# Patient Record
Sex: Female | Born: 1975 | Race: Black or African American | Hispanic: No | Marital: Single | Smoking: Never smoker
Health system: Southern US, Community
[De-identification: ages and names within clinical notes are randomized; demographics above are authoritative.]

## PROBLEM LIST (undated history)

## (undated) DIAGNOSIS — R0683 Snoring: Secondary | ICD-10-CM

## (undated) DIAGNOSIS — R55 Syncope and collapse: Secondary | ICD-10-CM

## (undated) DIAGNOSIS — I429 Cardiomyopathy, unspecified: Secondary | ICD-10-CM

## (undated) DIAGNOSIS — I471 Supraventricular tachycardia, unspecified: Secondary | ICD-10-CM

## (undated) DIAGNOSIS — R42 Dizziness and giddiness: Secondary | ICD-10-CM

## (undated) DIAGNOSIS — G43909 Migraine, unspecified, not intractable, without status migrainosus: Secondary | ICD-10-CM

## (undated) DIAGNOSIS — Z5189 Encounter for other specified aftercare: Secondary | ICD-10-CM

## (undated) DIAGNOSIS — Z9289 Personal history of other medical treatment: Secondary | ICD-10-CM

## (undated) HISTORY — DX: Cardiomyopathy, unspecified: I42.9

## (undated) HISTORY — PX: EPIGASTRIC HERNIA REPAIR: SHX404

## (undated) HISTORY — DX: Supraventricular tachycardia: I47.1

## (undated) HISTORY — DX: Personal history of other medical treatment: Z92.89

## (undated) HISTORY — PX: VAGINAL HYSTERECTOMY: SUR661

## (undated) HISTORY — DX: Dizziness and giddiness: R42

## (undated) HISTORY — PX: TONSILLECTOMY: SUR1361

## (undated) HISTORY — DX: Snoring: R06.83

## (undated) HISTORY — PX: TUBAL LIGATION: SHX77

## (undated) HISTORY — DX: Syncope and collapse: R55

## (undated) HISTORY — DX: Migraine, unspecified, not intractable, without status migrainosus: G43.909

## (undated) HISTORY — DX: Supraventricular tachycardia, unspecified: I47.10

## (undated) HISTORY — PX: CYSTECTOMY: SUR359

## (undated) HISTORY — DX: Encounter for other specified aftercare: Z51.89

---

## 2001-10-31 ENCOUNTER — Emergency Department (HOSPITAL_COMMUNITY): Admission: EM | Admit: 2001-10-31 | Discharge: 2001-10-31 | Payer: Self-pay | Admitting: Emergency Medicine

## 2002-01-22 ENCOUNTER — Encounter: Payer: Self-pay | Admitting: Emergency Medicine

## 2002-01-22 ENCOUNTER — Emergency Department (HOSPITAL_COMMUNITY): Admission: EM | Admit: 2002-01-22 | Discharge: 2002-01-22 | Payer: Self-pay | Admitting: Emergency Medicine

## 2002-10-09 ENCOUNTER — Emergency Department (HOSPITAL_COMMUNITY): Admission: EM | Admit: 2002-10-09 | Discharge: 2002-10-09 | Payer: Self-pay | Admitting: Emergency Medicine

## 2002-10-18 ENCOUNTER — Ambulatory Visit (HOSPITAL_COMMUNITY): Admission: RE | Admit: 2002-10-18 | Discharge: 2002-10-18 | Payer: Self-pay

## 2002-10-18 ENCOUNTER — Encounter: Payer: Self-pay | Admitting: Family Medicine

## 2003-08-14 ENCOUNTER — Ambulatory Visit (HOSPITAL_COMMUNITY): Admission: RE | Admit: 2003-08-14 | Discharge: 2003-08-14 | Payer: Self-pay | Admitting: Internal Medicine

## 2003-08-24 ENCOUNTER — Other Ambulatory Visit: Admission: RE | Admit: 2003-08-24 | Discharge: 2003-08-24 | Payer: Self-pay | Admitting: Otolaryngology

## 2003-09-15 ENCOUNTER — Emergency Department (HOSPITAL_COMMUNITY): Admission: EM | Admit: 2003-09-15 | Discharge: 2003-09-15 | Payer: Self-pay | Admitting: Emergency Medicine

## 2004-01-15 ENCOUNTER — Ambulatory Visit: Payer: Self-pay | Admitting: Family Medicine

## 2004-01-19 ENCOUNTER — Ambulatory Visit: Payer: Self-pay | Admitting: Family Medicine

## 2004-01-22 ENCOUNTER — Ambulatory Visit: Payer: Self-pay | Admitting: *Deleted

## 2004-01-23 ENCOUNTER — Ambulatory Visit: Payer: Self-pay | Admitting: Family Medicine

## 2004-01-30 ENCOUNTER — Ambulatory Visit: Payer: Self-pay | Admitting: *Deleted

## 2004-08-29 ENCOUNTER — Emergency Department (HOSPITAL_COMMUNITY): Admission: EM | Admit: 2004-08-29 | Discharge: 2004-08-29 | Payer: Self-pay | Admitting: Emergency Medicine

## 2004-09-20 ENCOUNTER — Ambulatory Visit: Payer: Self-pay | Admitting: Family Medicine

## 2004-11-11 ENCOUNTER — Emergency Department (HOSPITAL_COMMUNITY): Admission: EM | Admit: 2004-11-11 | Discharge: 2004-11-11 | Payer: Self-pay | Admitting: Emergency Medicine

## 2005-01-23 ENCOUNTER — Ambulatory Visit: Payer: Self-pay | Admitting: Family Medicine

## 2005-01-28 ENCOUNTER — Ambulatory Visit (HOSPITAL_COMMUNITY): Admission: RE | Admit: 2005-01-28 | Discharge: 2005-01-28 | Payer: Self-pay | Admitting: Internal Medicine

## 2005-02-12 ENCOUNTER — Ambulatory Visit: Payer: Self-pay | Admitting: Family Medicine

## 2005-03-12 ENCOUNTER — Ambulatory Visit: Payer: Self-pay | Admitting: Family Medicine

## 2005-06-16 ENCOUNTER — Ambulatory Visit: Payer: Self-pay | Admitting: Family Medicine

## 2005-09-11 ENCOUNTER — Ambulatory Visit: Payer: Self-pay | Admitting: Family Medicine

## 2005-10-21 ENCOUNTER — Ambulatory Visit: Payer: Self-pay | Admitting: Family Medicine

## 2005-12-22 ENCOUNTER — Emergency Department (HOSPITAL_COMMUNITY): Admission: EM | Admit: 2005-12-22 | Discharge: 2005-12-22 | Payer: Self-pay | Admitting: Emergency Medicine

## 2005-12-25 ENCOUNTER — Emergency Department (HOSPITAL_COMMUNITY): Admission: EM | Admit: 2005-12-25 | Discharge: 2005-12-25 | Payer: Self-pay | Admitting: Emergency Medicine

## 2007-05-21 ENCOUNTER — Emergency Department (HOSPITAL_COMMUNITY): Admission: EM | Admit: 2007-05-21 | Discharge: 2007-05-21 | Payer: Self-pay | Admitting: Emergency Medicine

## 2007-09-14 ENCOUNTER — Ambulatory Visit: Payer: Self-pay | Admitting: Family Medicine

## 2007-09-14 ENCOUNTER — Observation Stay (HOSPITAL_COMMUNITY): Admission: EM | Admit: 2007-09-14 | Discharge: 2007-09-15 | Payer: Self-pay | Admitting: Emergency Medicine

## 2007-09-30 ENCOUNTER — Ambulatory Visit: Payer: Self-pay | Admitting: Family Medicine

## 2007-10-05 ENCOUNTER — Ambulatory Visit: Payer: Self-pay | Admitting: Internal Medicine

## 2007-10-05 LAB — CONVERTED CEMR LAB
Eosinophils Relative: 0 % (ref 0–5)
Lymphs Abs: 1.9 10*3/uL (ref 0.7–4.0)
MCHC: 31.4 g/dL (ref 30.0–36.0)
Neutro Abs: 4.5 10*3/uL (ref 1.7–7.7)
Neutrophils Relative %: 63 % (ref 43–77)
Platelets: 470 10*3/uL — ABNORMAL HIGH (ref 150–400)
RBC: 4.74 M/uL (ref 3.87–5.11)
RDW: 25.6 % — ABNORMAL HIGH (ref 11.5–15.5)
Retic Ct Pct: 0.3 % — ABNORMAL LOW (ref 0.4–3.1)
WBC: 7 10*3/uL (ref 4.0–10.5)

## 2007-12-20 ENCOUNTER — Ambulatory Visit: Payer: Self-pay | Admitting: Family Medicine

## 2008-03-16 ENCOUNTER — Ambulatory Visit (HOSPITAL_COMMUNITY): Admission: RE | Admit: 2008-03-16 | Discharge: 2008-03-16 | Payer: Self-pay | Admitting: Obstetrics & Gynecology

## 2008-03-16 ENCOUNTER — Ambulatory Visit: Payer: Self-pay | Admitting: Obstetrics & Gynecology

## 2008-03-29 ENCOUNTER — Encounter: Payer: Self-pay | Admitting: Obstetrics & Gynecology

## 2008-03-29 ENCOUNTER — Ambulatory Visit: Payer: Self-pay | Admitting: Obstetrics & Gynecology

## 2008-06-20 ENCOUNTER — Encounter: Payer: Self-pay | Admitting: Obstetrics & Gynecology

## 2008-06-20 ENCOUNTER — Inpatient Hospital Stay (HOSPITAL_COMMUNITY): Admission: RE | Admit: 2008-06-20 | Discharge: 2008-06-23 | Payer: Self-pay | Admitting: Obstetrics & Gynecology

## 2008-06-20 ENCOUNTER — Ambulatory Visit: Payer: Self-pay | Admitting: Obstetrics & Gynecology

## 2008-08-10 ENCOUNTER — Ambulatory Visit: Payer: Self-pay | Admitting: Obstetrics and Gynecology

## 2008-08-10 LAB — CONVERTED CEMR LAB: Trich, Wet Prep: NONE SEEN

## 2008-12-07 ENCOUNTER — Emergency Department (HOSPITAL_COMMUNITY): Admission: EM | Admit: 2008-12-07 | Discharge: 2008-12-07 | Payer: Self-pay | Admitting: Emergency Medicine

## 2008-12-22 ENCOUNTER — Ambulatory Visit: Payer: Self-pay | Admitting: Family Medicine

## 2008-12-22 ENCOUNTER — Encounter (INDEPENDENT_AMBULATORY_CARE_PROVIDER_SITE_OTHER): Payer: Self-pay | Admitting: Adult Health

## 2008-12-22 ENCOUNTER — Telehealth (INDEPENDENT_AMBULATORY_CARE_PROVIDER_SITE_OTHER): Payer: Self-pay | Admitting: *Deleted

## 2008-12-22 LAB — CONVERTED CEMR LAB
ALT: 13 units/L (ref 0–35)
Albumin: 4.1 g/dL (ref 3.5–5.2)
BUN: 10 mg/dL (ref 6–23)
Basophils Absolute: 0 10*3/uL (ref 0.0–0.1)
Basophils Relative: 0 % (ref 0–1)
CO2: 24 meq/L (ref 19–32)
Calcium: 8.9 mg/dL (ref 8.4–10.5)
Chloride: 106 meq/L (ref 96–112)
Eosinophils Relative: 1 % (ref 0–5)
HDL: 57 mg/dL (ref >39)
Hemoglobin: 12.4 g/dL (ref 12.0–15.0)
LDL Cholesterol: 71 mg/dL (ref 0–99)
Lymphocytes Relative: 31 % (ref 12–46)
Monocytes Absolute: 0.4 10*3/uL (ref 0.1–1.0)
Neutrophils Relative %: 61 % (ref 43–77)
Platelets: 329 10*3/uL (ref 150–400)
Potassium: 4.1 meq/L (ref 3.5–5.3)
RBC: 4.57 M/uL (ref 3.87–5.11)
T3 Uptake Ratio: 37.2 % — ABNORMAL HIGH (ref 22.5–37.0)
Total Bilirubin: 0.3 mg/dL (ref 0.3–1.2)
Vit D, 25-Hydroxy: 24 ng/mL — ABNORMAL LOW (ref 30–89)
WBC: 5.3 10*3/uL (ref 4.0–10.5)

## 2009-05-22 ENCOUNTER — Ambulatory Visit: Payer: Self-pay | Admitting: Internal Medicine

## 2009-05-22 ENCOUNTER — Encounter (INDEPENDENT_AMBULATORY_CARE_PROVIDER_SITE_OTHER): Payer: Self-pay | Admitting: Adult Health

## 2009-05-22 LAB — CONVERTED CEMR LAB
BUN: 6 mg/dL (ref 6–23)
Basophils Absolute: 0 10*3/uL (ref 0.0–0.1)
CO2: 24 meq/L (ref 19–32)
Chloride: 106 meq/L (ref 96–112)
Creatinine, Ser: 0.58 mg/dL (ref 0.40–1.20)
Glucose, Bld: 85 mg/dL (ref 70–99)
Lipase: 10 units/L (ref 0–75)
Lymphocytes Relative: 14 % (ref 12–46)
Lymphs Abs: 1.2 10*3/uL (ref 0.7–4.0)
Monocytes Relative: 5 % (ref 3–12)
Neutro Abs: 6.6 10*3/uL (ref 1.7–7.7)
Platelets: 380 10*3/uL (ref 150–400)
Potassium: 4.1 meq/L (ref 3.5–5.3)
Total Bilirubin: 0.3 mg/dL (ref 0.3–1.2)
Total Protein: 7.6 g/dL (ref 6.0–8.3)

## 2009-05-24 ENCOUNTER — Encounter (INDEPENDENT_AMBULATORY_CARE_PROVIDER_SITE_OTHER): Payer: Self-pay | Admitting: *Deleted

## 2009-05-24 ENCOUNTER — Ambulatory Visit (HOSPITAL_COMMUNITY): Admission: RE | Admit: 2009-05-24 | Discharge: 2009-05-24 | Payer: Self-pay | Admitting: Internal Medicine

## 2009-05-28 ENCOUNTER — Ambulatory Visit: Payer: Self-pay | Admitting: Family Medicine

## 2009-06-25 ENCOUNTER — Ambulatory Visit: Payer: Self-pay | Admitting: Internal Medicine

## 2010-06-04 NOTE — Letter (Signed)
Summary: *HSN Results Follow up  [facility]  [address] [place], [place] [address]   Phone: [phone]  Fax: [phone]      05/24/2009   Tasha Ross [address] [employer] [place], [place]  [address]   Dear  [name],                            ____S.Drinkard,FNP   _X___A.Chamkasem,FNP       ____B. McPherson,MD   ____V. Rankins,MD    ____E. Mulberry,MD    ____N. Daphine Deutscher, FNP  ____D. Reche Dixon, MD    ____K. Philipp Deputy, MD    ____Other     This letter is to inform you that your recent test(s):  _______Pap Smear    ___X____Lab Test     _______X-ray    ___X____ is within acceptable limits  _______ requires a medication change  _______ requires a follow-up lab visit  _______ requires a follow-up visit with your provider   Comments: Your lab work is normal.       _________________________________________________________ If you have any questions, please contact our office                     Sincerely,  Gaylyn Cheers RN [facility]

## 2010-08-20 LAB — CBC
HCT: 24.4 % — ABNORMAL LOW (ref 36.0–46.0)
MCHC: 32.7 g/dL (ref 30.0–36.0)
MCHC: 32.7 g/dL (ref 30.0–36.0)
MCV: 76.7 fL — ABNORMAL LOW (ref 78.0–100.0)
MCV: 76.8 fL — ABNORMAL LOW (ref 78.0–100.0)
Platelets: 359 10*3/uL (ref 150–400)
RBC: 3.18 MIL/uL — ABNORMAL LOW (ref 3.87–5.11)
RBC: 3.31 MIL/uL — ABNORMAL LOW (ref 3.87–5.11)
RDW: 16.1 % — ABNORMAL HIGH (ref 11.5–15.5)
WBC: 10.1 10*3/uL (ref 4.0–10.5)
WBC: 4.4 10*3/uL (ref 4.0–10.5)
WBC: 6.5 10*3/uL (ref 4.0–10.5)

## 2010-08-20 LAB — CROSSMATCH: ABO/RH(D): O POS

## 2010-08-20 LAB — ABO/RH: ABO/RH(D): O POS

## 2010-09-17 NOTE — Group Therapy Note (Signed)
NAME:  Tasha Ross, Tasha Ross NO.:  [id number]   MEDICAL RECORD NO.:  [id number]          PATIENT TYPE:  WOC   LOCATION:  [facility]                   FACILITY:  [facility]   PHYSICIAN:  Dorthula Perfect, MD     DATE OF BIRTH:  Oct 20, 1975   DATE OF SERVICE:                                  CLINIC NOTE   A 35 year old female returns for followup with a past problem of  menorrhagia, fibroids, and secondary anemia.  The clinic note, May 2009,  shows that she had required 3 units of packed cells because of her  hemoglobin of 6.1.  Ultrasound has revealed submucous fibroid measuring  6.1 cm x 4.4 x 6 cm.  There is a notation that shows that she had had a  previous ultrasound in the past in 2006, at which time the fibroid was  3.1 cm.  Dr. Sharol Harness checked the patient and consulted with Dr. Marice Potter.  She was treated with Depo-Lupron 11.75.  She returns today for repeat  Depo-Lupron.  On questioning the patient is not sure whether she thought  that she would end up having a hysterectomy or not and she thought she  might continue to get the Depo-Lupron on into the future.   She is gravida 4, para 4.  Last menstrual period was a week ago.  That  was heavy with clots and lot of pain.  Prior to that, since the Depo-  Lupron, she was having normal periods that will last 5-7 days.  This  last period was much more painful.   Refer to the clinic note of Sep 30, 2007.   PHYSICAL EXAMINATION:  VITAL SIGNS:  Blood pressure 111/74, weight 150  pounds, height 5 feet 2 inches.  ABDOMEN:  Slightly obese, soft, and nontender.  The uterus is not  palpable abdominally.  PELVIC:  External genitalia and BUS glands normal.  Vaginal vault  epithelialized as was the cervix.  Bimanual examination reveals the  uterus to be upper limits of normal size with what feels like a very  firm fundal area, most likely the myoma, that measures 7+ cm in size.  Ovaries were bilaterally normal.   IMPRESSION:  Uterine  fibroids, symptomatic.   DISPOSITION:  1. Since the patient received Depo-Lupron in August and was expecting      it today, I am repeating it.  2. I have counsel her however that Depo-Lupron is not generally      continued on into the future.  At once the fibroids have shrunken      some, the patient usually would then undergo hysterectomy.  I      discussed hysterectomy with her and reassured her that if the      ovaries are normal, they are not removed.  3. She will have a repeat ultrasound.  4. She will return in next 2-3 weeks to see Dr. Silas Flood and Dr. Marice Potter      regarding hysterectomy.           ______________________________  Dorthula Perfect, MD     ER/MEDQ  D:  03/16/2008  T:  03/17/2008  Job:  [id number] 

## 2010-09-17 NOTE — Op Note (Signed)
NAMEBROOKELLE, PELLICANE              ACCOUNT NO.:  [id number]   MEDICAL RECORD NO.:  [id number]         PATIENT TYPE:  WINP   LOCATION:                                [facility]   PHYSICIAN:  Allie Bossier, MD        DATE OF BIRTH:  14-Jul-1975   DATE OF PROCEDURE:  06/20/2008  DATE OF DISCHARGE:                               OPERATIVE REPORT   PREOPERATIVE DIAGNOSES:  1. Uterine fibroids.  2. Anemia requiring transfusions.   POSTOPERATIVE DIAGNOSES:  1. Uterine fibroids.  2. Anemia requiring transfusions.   PROCEDURE:  Total abdominal hysterectomy.   SURGEON:  Allie Bossier, MD and Scheryl Darter, MD   ANESTHESIA:  Belva Agee, MD, GETA.   COMPLICATIONS:  None.   ESTIMATED BLOOD LOSS:  100 mL.   SPECIMENS:  Uterus with cervix.   FINDINGS:  Normal adnexa and fibroid uterus.   DETAILS OF PROCEDURE AND FINDINGS:  The risks, benefits, alternatives of  surgery were explained, understood, and accepted.  Consents were signed.  She was taken to the operating room and general anesthesia was applied  without complication.  Her abdomen and vagina were prepped and draped in  usual sterile fashion.  A Foley catheter was placed which drained clear  urine throughout the case (225 mL during the case).  A  transverse  incision was made approximately 2 cm above the symphysis pubis.  Incision was carried down through the subcutaneous tissue.  The fascia  bleeding encountered was cauterized with the Bovie.  Fascia was scored  in the midline.  Fascial incision was extended bilaterally and curved  slightly upwards.  The middle one-third of the rectus muscles were  separated in transverse fashion using electrosurgical technique.  Excellent hemostasis was noted.  The peritoneum was entered with  hemostats.  Peritoneal incision was extended bilaterally and curved  slightly upwards.  The peritoneum was entered.  The bowel was packed out  of the abdominal cavity with moist lap sponges.   O'Connor-O'Sullivan  self-retaining retractor was placed after lining the abdominal sidewalls  with wet lap sponges.  The pelvis was then inspected.  The adnexa  appeared normal.  The utero-ovarian ligaments were grasped with Kocher  clamps for uterine manipulation.  The utero-ovarian ligaments were  clamped, cut, and doubly ligated.  2-0 Vicryl sutures were used  throughout this case unless otherwise specified.  The round ligaments  were clamped, cut, and ligated.  The bladder flap was created.  The  bladder was pushed out of the operative site with a moist sponge on the  stick.  The bladder was well out of the operative site throughout the  case.  The uterine vessels were skeletonized, clamped, cut, and doubly  ligated.  The cervix was separated from its pelvic attachments using  clamp, cut, and ligate technique.  The uterosacral ligaments were  clamped, cut, and ligated as well.  The vagina was entered sharply and  the cervix was removed and noted to be intact.  The vaginal cuff was  closed with a running locking 2-0 Vicryl suture.  The  pelvis was  irrigated with a liter of warm normal saline.  Excellent hemostasis was  noted.  The ureters were noted to be of normal caliber and functioning.  She was taken to recovery room in stable condition.  After the  subcutaneous tissue was infiltrated with 30 mL of 0.5% Marcaine, a  subcuticular closure was done with 3-0 Vicryl suture.  Steri-Strips were  placed.  Instrument, sponge, and needle counts were correct.      Allie Bossier, MD  Electronically Signed     MCD/MEDQ  D:  06/20/2008  T:  06/21/2008  Job:  [id number]

## 2010-09-17 NOTE — Discharge Summary (Signed)
Tasha Ross, Tasha Ross              ACCOUNT NO.:  [id number]   MEDICAL RECORD NO.:  [id number]          PATIENT TYPE:  OBV   LOCATION:  5502                         FACILITY:  [facility]   PHYSICIAN:  Leighton Roach McDiarmid, M.D.DATE OF BIRTH:  [birth year]   DATE OF ADMISSION:  09/14/2007  DATE OF DISCHARGE:  09/15/2007                               DISCHARGE SUMMARY   REASON FOR HOSPITALIZATION:  Chest pain with low hemoglobin of 6.1 on  admission to the emergency department.   SIGNIFICANT FINDINGS:  1. Chest x-ray performed on admission Sep 14, 2007, showed no active      disease.  2. Electrolytes on admission were all within normal limits including      normal liver function panel.  3. Hemoglobin on admission was 6.1, white blood cell count was 5.3,      and platelets 277.  4. D-dimer was 0.33 on admission.  It was not felt that the patient      had pulmonary embolism.  One set of cardiac enzymes were drawn and      were negative.  5. Total iron was less than 10.  Serum ferritin was less than one.  6. Folate was greater than 20 and vitamin B12 was low normal at 226.  7. Thyroid-stimulating hormone was 1.48.   HOSPITAL COURSE:  The patient is a 35 year old female with a history of  menorrhagia who presented near the end of her menstrual cycle with  history of heavy bleeding for several days previously.  The patient's  hemoglobin on admission was 6.1 and the patient initially presented with  chest pain.  Cardiac markers were negative and D-dimer was negative for  pulmonary embolism.  Therefore, the patient given her hemoglobin 6.1 was  transfused 3 units of packed red blood cells.  On the day after  transfusions, the patient's hemoglobin increased from  6.1 to 9.9.  The  patient had no further pain and felt much better.  The patient's vital  signs remained stable throughout her admission and on Sep 15, 2007, the  patient reported that she had no further bleeding and was therefore  discharged to home in stable condition with close followup at [facility] for gynecological evaluation.  The patient's  hemoglobin responded to 9.9 which is approximately her baseline given  that the patient has history of fibroid uterus, a transvaginal  ultrasound was obtained prior to discharge.  That study is pending at  the time of this dictation, and she will be followed up with her  outpatient consultation with gynecology.  Last transvaginal ultrasound  for evaluation of fibroid uterus prior to this admission was in 2006  approximately.  Given the patient's iron panel which showed a low iron,  consistent with iron deficiency anemia, the patient was started on oral  iron therapy as an outpatient.  She reports that she has been intolerant  to ferrous sulfate in the past, therefore we started her on Slow FE, a  slow release form of iron supplementation 1 tablet twice daily for the  next 3-6 months.  This should be followed  up as an outpatient.   PROCEDURES:  Transvaginal ultrasound.   CONSULTATIONS:  None.   DISCHARGE DIAGNOSIS:  Anemia with hemoglobin of 6.1 secondary to  menorrhagia.  Chronic Blood Loss  Menorrhagia   DISCHARGE CONDITION:  Stable, good.   DISPOSITION:  Patient discharged to home.   DISCHARGE MEDICATIONS:  Slow FE 1 tablet by mouth twice daily.   DISCHARGE INSTRUCTIONS:  1. The patient is to follow up at [facility] in the      Gynecology Clinic on July 31, 2007, at 2:00 p.m.  This      appointments already been made for her.  2. The patient is to return to the ER, seek medical attention for any      large bleeding, weakness, dizziness, shortness of breath, or any      other concerning symptoms.   Pending issues at the time of discharge that should be followed up;  The patient's transvaginal ultrasound should be reviewed upon followup  at [facility] on Sep 30, 2007.  This transvaginal ultrasound  is on the  hospital electronic record and is currently pending at time of  this dictation.      Tasha Soman, MD  Electronically Signed      Leighton Roach McDiarmid, M.D.  Electronically Signed    TE/MEDQ  D:  09/15/2007  T:  09/16/2007  Job:  [id number]

## 2010-09-17 NOTE — Discharge Summary (Signed)
Tasha Ross, Tasha Ross              ACCOUNT NO.:  [id number]   MEDICAL RECORD NO.:  [id number]          PATIENT TYPE:  INP   LOCATION:  9317                          [facility]   PHYSICIAN:  Allie Bossier, MD        DATE OF BIRTH:  [birth year]   DATE OF ADMISSION:  06/20/2008  DATE OF DISCHARGE:  06/23/2008                               DISCHARGE SUMMARY   ADMISSION DIAGNOSES:  1. Fibroids.  2. Anemia.   DISCHARGE DIAGNOSES:  1. Fibroids.  2. Anemia.   PROCEDURE:  Total abdominal hysterectomy.   COMPLICATIONS:  None.   CONDITION:  Stable.   DISPOSITION:  To home.   DIET:  As tolerated.   ACTIVITY:  She should refrain from driving a car while she is on  narcotics or in enough pain, not to be able to put on the brakes.  She  is to have nothing per vagina x6 weeks.  She will follow up in 6 weeks.   DISCHARGE MEDICATIONS:  1. Ibuprofen 800 mg 1 p.o. q.8 h. p.r.n. pain, #40.  2. Percocet 325/5 mg 1 p.o. q.4 h. p.r.n. pain, #30, no refills.   HISTORY OF PRESENT ILLNESS AND HOSPITAL COURSE:  Tasha Ross is a 35-year-  old, single, white, gravida 4, para 4, who has a history of fibroids and  anemia.  She has required blood transfusion last year and now wishes to  have her uterus removed.  She underwent an uncomplicated TAH under  general anesthesia with a minimal blood loss.  Preoperatively, her  hemoglobin was 9.5, on postop day #1, it was 8.3 and on postop day #2,  it was 8.0.  She remained afebrile throughout her hospital course.  By  the day of discharge, she was ambulating, tolerating p.o. without nausea  and vomiting.  She was experiencing flatus and voiding without any  difficulties.  She voiced readiness to go home and will follow up as  above.     Allie Bossier, MD  Electronically Signed    MCD/MEDQ  D:  06/23/2008  T:  06/23/2008  Job:  [id number]

## 2010-09-17 NOTE — Group Therapy Note (Signed)
Tasha Ross, BOUSQUET NO.:  [id number]   MEDICAL RECORD NO.:  [id number]          PATIENT TYPE:  WOC   LOCATION:  [facility]                   [facility]ACILITY[facility]   PHYSICIAN:  Karlton Lemon, MD      DATE OF BIRTH:  08/27/1975   DATE OF SERVICE:  09/30/2007                                  CLINIC NOTE   CHIEF COMPLAINT:  Follow up anemia secondary to menorrhagia.   HISTORY OF PRESENT ILLNESS:  Tasha Ross is a 35 year old gravida 4, para  4-0-0-4 that was admitted to [facility] on Sep 14, 2007 with a  hemoglobin of 6.1 attributed to heavy menstrual periods.  She states  that while she was there she was transfused 3 units of packed red blood  cells.  Records from that visit show that her hemoglobin at time of  discharge was 9.9.  She reports that she does have heavy periods every  28 days that last anywhere from 4-10 days.  She states she starts off  spotting for 2 days, then has 3 days of heavy bleeding requiring 2  tampons to be changed every 1 hour.  She also has pain in the bilateral  lower quadrants.  She did have a ultrasound performed during that  admission showing a uterus measuring 11.6 cm in length, fundus measuring  6.6 x 7.1 cm.  There was a submucosal fibroid measuring 6.1 cm x 4.4 cm  x 6 cm.  The right ovary contained a cyst; the left ovary was not well  visualized.   PAST MEDICAL HISTORY:  Anemia.   OBSTETRICAL HISTORY:  The patient has a history of spontaneous vaginal  delivery x4.   GYNECOLOGIC HISTORY:  The patient states she has never had an abnormal  Pap smear.   SURGICAL HISTORY:  The patient has had a laparoscopic bilateral tubal  ligation.  She denies other surgeries.   MEDICATIONS:  The patient is currently on iron 1 tablet by mouth twice  daily, dosage unknown.   ALLERGIES:  SULFA.   PHYSICAL EXAMINATION:  GENERAL:  Well-appearing female in no distress.  VITALS:  Temperature 99.7, pulse 66, blood pressure 107/66, weight  134.2  pounds.  CARDIOVASCULAR:  Heart regular rate and rhythm.  No murmurs, rubs or  gallops.  RESPIRATORY:  Lungs clear to auscultation bilaterally.  ABDOMEN:  Soft, mild tenderness in the suprapubic and bilateral lower  quadrants without rebound tenderness or guarding.  Bowel sounds are  noted all quadrants.  GENITOURINARY:  Normal female external genitalia.  Vaginal mucosa is  pink and moist.  No discharge is noted.  Cervix is midline.  There is a  very small amount of bright red blood from the os.  Bimanual  examination:  Uterus palpates about 8 weeks' gestation.  There is mild  tenderness with cervical motion.  No tenderness with palpation of the  adnexa.  There is no adnexal fullness appreciated.   ASSESSMENT/PLAN:  35 year old gravida 4, para 4-0-0-4 with submucosal  uterine fibroid and menorrhagia.  The patient was discussed with Dr.  Marice Potter, and the patient was given options of hysterectomy versus Depo  Lupron shots.  She elects for Depo Lupron and has applied for the [facility]  Program today.  We will give her one shot of Depo-Provera until her  approval has been determined.  She is to continue her iron.  No CBC was  drawn today as her bleeding has been stable since she received her  transfusion on admission at [facility].           ______________________________  Karlton Lemon, MD     NS/MEDQ  D:  09/30/2007  T:  09/30/2007  Job:  [phone]

## 2010-09-17 NOTE — Group Therapy Note (Signed)
NAME:  Tasha Ross, Tasha Ross NO.:  [id number]   MEDICAL RECORD NO.:  [id number]          PATIENT TYPE:  WOC   [facility]ATION:  [facility]                   [facility]ACILITY:  [facility]   PHYSICIAN:  Scheryl Darter, MD       DATE OF BIRTH:  10-30-1975   DATE OF SERVICE:                                  CLINIC NOTE   CHIEF COMPLAINT:  Followup after surgery.   Patient is a 35 year old single black female, gravida 4, para 4, who had  total abdominal hysterectomy performed by Dr. Marice Potter and myself on  June 20, 2008, without complications.  Pathology report noted a 5-cm  submucosal fibroid with atypical components which are considered benign.  Patient states she has some pain and a slight discharge.  No odor or  itching.   PHYSICAL EXAM:  No acute distress.  ABDOMEN:  Nondistended with a well-healing transverse lower abdominal  incision with some slight tenderness.  No redness, mass, or drainage.  PELVIC EXAM:  External genitalia and vagina showed good support and a  slight whitish discharge.  Wet mount was obtained.  Vaginal cuff is  intact, nontender.  No mass.   IMPRESSION:  Normal postoperative examination.   PLAN:  Follow up on the result of the wet prep and if infection is  identified patient will be notified.  She should follow up for routine  gynecologic care.      Scheryl Darter, MD     JA/MEDQ  D:  08/10/2008  T:  08/10/2008  Job:  [id number]

## 2010-09-17 NOTE — Group Therapy Note (Signed)
NAME:  Tasha, Ross NO.:  [id number]   MEDICAL RECORD NO.:  [id number]          PATIENT TYPE:  WOC   LOCATION:  [facility]                   [facility]ACILITY[facility]   PHYSICIAN:  Allie Bossier, MD        DATE OF BIRTH:  Mar 12, 1976   DATE OF SERVICE:  03/29/2008                                  CLINIC NOTE   Tasha Ross is a 35 year old single black gravida 4, para 4 with children  ages 71, 55, 84, and 61.  She comes in for followup ultrasound/surgical  workup.  She has been seen here in the past with a problem of  menorrhagia, uterine fibroids, and secondary anemia.  In fact, in May,  she was seen in the [facility] with hemoglobin of 6.1, and an ultrasound showed  a submucosal fibroid measuring 6 x 6 cm.  She had a previous ultrasound  several years earlier that showed this fibroid to be only 3 cm.  At that  time, she was given Depo-Lupron 11.75 mg and 3 units of packed red blood  cells.  She received her second 26-month Depo-Lupron injection 2 weeks  ago and is now here to discuss hysterectomy.  Of note, she tells me that  she has had an abnormal Pap smear in [place] in August 2008, but has  had no followup for that.  Today, I did a Pap smear and on physical  exam, noted no abnormalities of her lower genital tract.  On bimanual  exam, her uterus is globular, consistent with fibroids, is very mobile.  Her adnexa are not enlarged.  Her uterus right now is about 12 to 14-  week size uterus.  This could be removed via low-transverse incisions, I  feel certain.   ASSESSMENT AND PLAN:  Symptomatic uterine fibroids.  We will plan for an  abdominal hysterectomy to be scheduled prior to this next Depo-Lupron  running out (middle of February 2010).      Allie Bossier, MD     MCD/MEDQ  D:  03/29/2008  T:  03/30/2008  Job:  [id number]

## 2010-09-17 NOTE — H&P (Signed)
Tasha Ross, Tasha Ross              ACCOUNT NO.:  [id number]   MEDICAL RECORD NO.:  [id number]          PATIENT TYPE:  OBV   LOCATION:  5502                         FACILITY:  [facility]   PHYSICIAN:  Leighton Roach McDiarmid, M.D.DATE OF BIRTH:  [birth year]   DATE OF ADMISSION:  09/14/2007  DATE OF DISCHARGE:  09/15/2007                              HISTORY & PHYSICAL   PRIMARY CARE PHYSICIAN:  None.  [facility].   CHIEF COMPLAINT:  Chest pain and feeling off balance.   HISTORY OF PRESENT ILLNESS:  This is a 35 year old African-American  female with no past medical history, who presented to the emergency  department today with chest pain that started last night.  Her chest  pain has been intermittent and is not exertional or relieved by rest.  She states that the chest pain is actually relieved with deep  inspiration.  She also reports chronic fatigue as well as feeling off  balance for about 1 week now.  She does not have any problems with  breathing.  She has not had any blood in her stools or dark tarry stools  or coughing up blood or throwing up blood.  She does admit to a long  history of heavy prolonged periods.  She is currently menstruating and  has been menstruating for 1-1/2 weeks.  Her menstruation include clots.  She also bled for approximately 1-1/2 weeks in April; however, her  period in March was only 4 days' long.  She is not on any contraception.   REVIEW OF SYSTEMS:  As above.   PAST MEDICAL HISTORY:  None.   MEDICATIONS:  None.   ALLERGIES:  SULFA.   FAMILY HISTORY:  Noncontributory.   SOCIAL HISTORY:  She works at [employer].  She has 4 children.  She denies  any tobacco, alcohol, or drug abuse.   PAST SURGICAL HISTORY:  Bilateral tubal ligation and removal of ovarian  cyst.   PHYSICAL EXAMINATION:  VITAL SIGNS:  Temperature 99.5, blood pressure  103/63, pulse 86, and respirations 20.  She is 98% on room air.  GENERAL:  She appears in no acute distress.   She is alert and oriented  x3 and pleasant to converse with.  CARDIOVASCULAR:  She has a 2/6 systolic ejection murmur that I can hear  over the entire precordium with no radiation.  RESPIRATORY:  She has normal respiratory effort and lungs are clear to  auscultation bilaterally.  ABDOMEN:  Soft and nontender.  EXTREMITIES:  No edema.  NEURO:  She is alert and oriented with intact cranial nerves, and she is  moving all extremities.   LABS:  A CMET is normal.  Lipase is 24, D-dimer is 0.33.  Point of care  troponin marker is less than 0.05 and her CBC is significant for  hemoglobin of 6.1.   Chest x-ray:  No acute disease.   ASSESSMENT:  This is a 35 year old African-American female with a 2-day  history of chest pain and ongoing dizziness and fatigue.  Her hemoglobin  was 6.1 in the hospital.  1. Anemia. She denies any history of gastrointestinal bleeding, but  she has had some menorrhagia.  This is likely the cause of her      anemia.  We will transfuse 3 units of packed red blood cells which      she has consented for.  I will also obtain iron studies, B12,      folate, and PT as well as INR.   1. Chronic menorrhagia.  We will obtain a urine pregnancy test as well      as a thyroid hormone.  For treatment, we can consider INFeD therapy      versus placement of IUD versus endometrial ablation.  The patient      will probably need gynecological followup as an outpatient.   1. Chest pain.  This is likely secondary to her anemia.  Point of care      enzymes have been normal as well as her EKG on admission. We will      check one more set of cardiac markers and monitor her chest pain      while in the hospital.   DISPOSITION:  The patient can likely be discharged to home in the  morning pending stabilization of her hemoglobin and resolution of her  symptoms.      Sylvan Cheese, M.D.  Electronically Signed      Leighton Roach McDiarmid, M.D.  Electronically Signed    MJ/MEDQ   D:  09/14/2007  T:  09/15/2007  Job:  [id number]

## 2011-01-23 LAB — URINALYSIS, ROUTINE W REFLEX MICROSCOPIC
Bilirubin Urine: NEGATIVE
Glucose, UA: NEGATIVE
Hgb urine dipstick: NEGATIVE
Ketones, ur: NEGATIVE
Nitrite: NEGATIVE
Protein, ur: NEGATIVE
Specific Gravity, Urine: 1.019
Urobilinogen, UA: 0.2
pH: 5.5

## 2011-01-23 LAB — URINE MICROSCOPIC-ADD ON

## 2011-01-23 LAB — PREGNANCY, URINE: Preg Test, Ur: NEGATIVE

## 2011-01-29 LAB — CBC
HCT: 19 — ABNORMAL LOW
Hemoglobin: 6.1 — CL
MCHC: 31.8
MCV: 62.8 — ABNORMAL LOW
Platelets: 279
RBC: 3.03 — ABNORMAL LOW
RDW: 27.1 — ABNORMAL HIGH

## 2011-01-29 LAB — HEPATIC FUNCTION PANEL
ALT: 10
Bilirubin, Direct: <0.1
Total Protein: 6.7

## 2011-01-29 LAB — CROSSMATCH: ABO/RH(D): O POS

## 2011-01-29 LAB — TSH: TSH: 1.482

## 2011-01-29 LAB — PROTIME-INR: Prothrombin Time: 14.5

## 2011-01-29 LAB — POCT CARDIAC MARKERS
Myoglobin, poc: 19.1
Operator id: 294521

## 2011-01-29 LAB — FOLATE: Folate: >20

## 2011-01-29 LAB — POCT I-STAT, CHEM 8
BUN: 9
Calcium, Ion: 1.14
Creatinine, Ser: 0.7
Glucose, Bld: 68 — ABNORMAL LOW
Hemoglobin: 7.5 — CL
TCO2: 22

## 2011-01-29 LAB — DIFFERENTIAL
Basophils Absolute: 0
Eosinophils Absolute: 0.1
Monocytes Absolute: 0.6
Neutro Abs: 2.7
Neutrophils Relative %: 52

## 2011-01-29 LAB — TROPONIN I: Troponin I: <0.01

## 2011-01-29 LAB — POCT PREGNANCY, URINE
Operator id: 194561
Preg Test, Ur: NEGATIVE

## 2011-01-29 LAB — CK TOTAL AND CKMB (NOT AT ARMC)
CK, MB: 0.8
Relative Index: INVALID
Total CK: 90

## 2011-01-29 LAB — IRON AND TIBC

## 2011-01-29 LAB — APTT: aPTT: 30

## 2011-01-29 LAB — BASIC METABOLIC PANEL
BUN: 4 — ABNORMAL LOW
Calcium: 8.6
GFR calc non Af Amer: >60
Glucose, Bld: 82
Sodium: 138

## 2011-01-29 LAB — D-DIMER, QUANTITATIVE: D-Dimer, Quant: 0.33

## 2015-01-03 ENCOUNTER — Ambulatory Visit: Payer: BLUE CROSS/BLUE SHIELD | Admitting: Neurology

## 2015-01-17 ENCOUNTER — Encounter: Payer: Self-pay | Admitting: Neurology

## 2015-01-17 ENCOUNTER — Ambulatory Visit (INDEPENDENT_AMBULATORY_CARE_PROVIDER_SITE_OTHER): Payer: BLUE CROSS/BLUE SHIELD | Admitting: Neurology

## 2015-01-17 VITALS — BP 118/80 | HR 65 | Wt 143.4 lb

## 2015-01-17 DIAGNOSIS — G43109 Migraine with aura, not intractable, without status migrainosus: Secondary | ICD-10-CM | POA: Insufficient documentation

## 2015-01-17 DIAGNOSIS — R55 Syncope and collapse: Secondary | ICD-10-CM | POA: Diagnosis not present

## 2015-01-17 DIAGNOSIS — G43909 Migraine, unspecified, not intractable, without status migrainosus: Secondary | ICD-10-CM

## 2015-01-17 MED ORDER — AMITRIPTYLINE HCL 25 MG PO TABS: ORAL_TABLET | ORAL | Status: DC

## 2015-01-17 MED ORDER — ZOLMITRIPTAN 5 MG NA SOLN: 1.0000 | NASAL | Status: DC | PRN

## 2015-01-17 NOTE — Progress Notes (Signed)
NEUROLOGY CONSULTATION NOTE  CRISTAL DELAROCA MRN: [id number] DOB: Apr 27, 1976  Referring provider: Dr. Lupe Carney Primary care provider: Dr. Lupe Carney  Reason for consult:  syncope  Dear Dr Clovis Riley:  Thank you for your kind referral of Tasha Ross for consultation of the above symptoms. Although her history is well known to you, please allow me to reiterate it for the purpose of our medical record.Records and images were personally reviewed where available.  HISTORY OF PRESENT ILLNESS: This is a 39 year old right-handed woman presenting to establish care for vertigo, headaches, and episodes of passing out that started in 2009 or 2010. She had seen neurologist Dr. Shane Crutch in 2012 while living in [place], reporting that she is having the exact same symptoms she had, they had gotten under control while she was seeing him, with no episodes for a yearuntil January 2016 as she was transitioning for her move to [place] last March. She reports migraines followed a couple of days later by vertigo. She would brace herself, trying to sit still, but would lose consciousness. She has fallen to the floor at work. Sometimes she can brace herself, other times there is no warning. Symptoms are brief, she would wake up on the floor. She was admitted to [facility] last 10/22/14 for near syncope, she reported right-sided weakness with slurred speech, that improved during transport. Glucose noted to be 72. She had an MRI brain without contrast with no acute intracranial changes. MRA brain reported a 1.16mm bulbous appearance of the distal left PICA branches in the region of the inferior cerebellar vermis. It may represent a vascular loop, however a small aneurysm is not entirely excluded. 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 Records from her previous neurologist were reviewed. He was concerned that dizziness and vertigo were not physiological in nature.  He had done an MRI/MRA brain reported as normal as well. TSH, RPR, CBC normal. B12 low normal at 278, she had received replacement treatment. For migraine prophylaxis, she was started on amitriptyline in 2012, then switched to Topamax in 2013. She reports migraines are retro-orbital, sometimes dull or sharp pain, with phonophobia, nausea. She recalls taking amitriptyline and Topamax, and states these were stopped because the episodes stopped. She recalls taking Imitrex, Fioricet, Maxalt which would help initially. She feels the amitriptyline and citalopram in the past helped put her in a calm mode. She has been having more frequent migraines occurring twice a week, lasting 3-4 hours. Ibuprofen does not help. The vertigo occurs a couple of days after a migraine, with spinning sensation lasting up to a week. She usually gets less than  6 hours of sleep, feeling "real agitated." She reports the last syncopal episode was in June. When asked about stress, she does indicate that she has been working for [employer] since 2014, but had to move to a different location with her move in January.   She denies any diplopia, dysarthria, dysphagia, bowel/bladder dysfunction. She has back pain radiating down her left leg and occasional neck pain. She reports head injury at age 39. No family history of migraines or syncope. She denies any olfactory/gustatory hallucinations, deja vu, rising epigastric sensation, myoclonic jerks. She had a normal birth and early development.  There is no history of febrile convulsions, CNS infections such as meningitis/encephalitis, significant traumatic brain injury, neurosurgical procedures, or family history of seizures.  PAST MEDICAL HISTORY: Past Medical History  Diagnosis Date  . Migraine   .  Vertigo     PAST SURGICAL HISTORY: Past Surgical History  Procedure Laterality Date  . Vaginal hysterectomy      MEDICATIONS: No current outpatient prescriptions on file prior to visit.    No current facility-administered medications on file prior to visit.    ALLERGIES: Allergies  Allergen Reactions  . Sulfur Swelling    FAMILY HISTORY: Family History  Problem Relation Age of Onset  . Cancer Father   . Hypertension    . Lupus    . Diabetes      SOCIAL HISTORY: Social History   Social History  . Marital Status: Single    Spouse Name: N/A  . Number of Children: N/A  . Years of Education: N/A   Occupational History  . Not on file.   Social History Main Topics  . Smoking status: Never Smoker   . Smokeless tobacco: Never Used  . Alcohol Use: 0.0 oz/week    0 Standard drinks or equivalent per week  . Drug Use: No  . Sexual Activity: Not on file   Other Topics Concern  . Not on file   Social History Narrative   Lives with 3 of her sons.  Has 4 sons.  Works for [employer].  Education: some college.       REVIEW OF SYSTEMS: Constitutional: No fevers, chills, or sweats, no generalized fatigue, change in appetite Eyes: No visual changes, double vision, eye pain Ear, nose and throat: No hearing loss, ear pain, nasal congestion, sore throat Cardiovascular: No chest pain, palpitations Respiratory:  No shortness of breath at rest or with exertion, wheezes GastrointestinaI: No nausea, vomiting, diarrhea, abdominal pain, fecal incontinence Genitourinary:  No dysuria, urinary retention or frequency Musculoskeletal:  + neck pain, back pain Integumentary: No rash, pruritus, skin lesions Neurological: as above Psychiatric: No depression, insomnia, anxiety Endocrine: No palpitations, fatigue, diaphoresis, mood swings, change in appetite, change in weight, increased thirst Hematologic/Lymphatic:  No anemia, purpura, petechiae. Allergic/Immunologic: no itchy/runny eyes, nasal congestion, recent allergic reactions, rashes  PHYSICAL EXAM: Filed Vitals:   01/17/15 1242  BP: 118/80  Pulse: 65   General: No acute distress Head:   Normocephalic/atraumatic Eyes: Fundoscopic exam shows bilateral sharp discs, no vessel changes, exudates, or hemorrhages Neck: supple, no paraspinal tenderness, full range of motion Back: No paraspinal tenderness Heart: regular rate and rhythm Lungs: Clear to auscultation bilaterally. Vascular: No carotid bruits. Skin/Extremities: No rash, no edema Neurological Exam: Mental status: alert and oriented to person, place, and time, no dysarthria or aphasia, Fund of knowledge is appropriate.  Recent and remote memory are intact.  Attention and concentration are normal.    Able to name objects and repeat phrases. Cranial nerves: CN I: not tested CN II: pupils equal, round and reactive to light, visual fields intact, fundi unremarkable. CN III, IV, VI:  full range of motion, no nystagmus, no ptosis CN V: facial sensation intact CN VII: upper and lower face symmetric CN VIII: hearing intact to finger rub CN IX, X: gag intact, uvula midline CN XI: sternocleidomastoid and trapezius muscles intact CN XII: tongue midline Bulk & Tone: normal, no fasciculations. Motor: 5/5 throughout with no pronator drift. Sensation: intact to light touch, cold, pin, vibration and joint position sense.  No extinction to double simultaneous stimulation.  Romberg test negative Deep Tendon Reflexes: +2 throughout, no ankle clonus Plantar responses: downgoing bilaterally Cerebellar: no incoordination on finger to nose, heel to shin. No dysdiadochokinesia Gait: narrow-based and steady, able to tandem walk adequately. Tremor: none  IMPRESSION: This is a 39 year old right-handed woman with a history of headaches, dizziness, and syncope since 2009 or 2010, symptoms had quieted down, then recurred at the beginning of the year. On review of records, her neurologist was concerned dizziness and vertigo were not physiologic in nature. She was brought to [facility] last June for a near syncopal episode with right leg weakness, felt  to have a conversion reaction. When asked about stress, she does report stress with move to [place] at the beginning of the year. She reports good response to amitriptyline in the past, she will restart 25mg  qhs for a week, then increase to 50mg  qhs. This may help with sleep and depression as well. She also reported taking citalopram in the past which calmed her down, this may be added in the future if needed. She will try Zomig nasal spray for migraine rescue. She was advised to have syncopal workup in the past but denies having any cardiac workup done. She will be scheduled for a 2-D echocardiogram and 30-day holter monitor.  [place] driving laws were discussed with the patient, and she knows to stop driving after an episode of loss of consciousness, until 6 months event-free. She will follow-up in 3 months and knows to call our office for any problems.   Thank you for allowing me to participate in the care of this patient. Please do not hesitate to call for any questions or concerns.   Patrcia Dolly, M.D.  CC: Dr. Clovis Riley

## 2015-01-17 NOTE — Patient Instructions (Signed)
1. Start amitriptyline 25mg : Take 1 tablet one hour before bedtime for 1 week, then increase to 2 tablets before bedtime 2. Try Zomig nasal spray at onset of migraine. Let us know if helpful and we will send refills 3. Schedule 2-D echocardiogram and 30-day Holter for syncope 4. Keep a calendar of your symptoms 5. As per [place] driving laws, after an episode of passing out, one should not drive until 6 months event-free 6. Follow-up in 3 months, call for any problems

## 2015-01-18 ENCOUNTER — Telehealth: Payer: Self-pay | Admitting: Neurology

## 2015-01-18 NOTE — Telephone Encounter (Signed)
Patient returned your call. Patient following up on scheduling her heart monitor she said you can leave a message if need be Call back number [phone]

## 2015-01-18 NOTE — Telephone Encounter (Signed)
Pt called in regards to heart monitor/Dawn CB# [phone]

## 2015-01-18 NOTE — Telephone Encounter (Signed)
Lmovm to return my call. 

## 2015-01-19 ENCOUNTER — Ambulatory Visit (HOSPITAL_COMMUNITY): Payer: BLUE CROSS/BLUE SHIELD | Attending: Cardiovascular Disease

## 2015-01-19 ENCOUNTER — Other Ambulatory Visit: Payer: Self-pay

## 2015-01-19 ENCOUNTER — Other Ambulatory Visit: Payer: Self-pay | Admitting: Neurology

## 2015-01-19 DIAGNOSIS — R55 Syncope and collapse: Secondary | ICD-10-CM | POA: Diagnosis not present

## 2015-01-19 DIAGNOSIS — I517 Cardiomegaly: Secondary | ICD-10-CM | POA: Diagnosis not present

## 2015-01-19 MED ORDER — SUMATRIPTAN SUCCINATE 50 MG PO TABS: ORAL_TABLET | ORAL | Status: DC

## 2015-01-19 MED ORDER — PREDNISONE 20 MG PO TABS: ORAL_TABLET | ORAL | Status: DC

## 2015-01-19 NOTE — Telephone Encounter (Signed)
Let's do Prednisone taper to break the cycle, Prednisone 20mg : Take 3 tabs on day 1, 2-1/2 tabs on day 2, 2 tabs on day 3, 1-1/2 tabs on day 4, 1 tab on day 5, 1/2 tab on days 6 and 7, then stop. #11 tabs no refills  Thanks

## 2015-01-19 NOTE — Telephone Encounter (Signed)
Rx for prednisone taper sent to patient's pharmacy. She also requested a Rx for recuse migraine medicine. She states after trying the sample of the zomig spray she doesn't like the feeling of the medicine going down her throat. She is requesting a rescue med in pill form. Dr. Karel Jarvis did ok for new Rx of Imitrex. Patient was notified.

## 2015-01-19 NOTE — Telephone Encounter (Signed)
She states Naproxen makes her sick on her stomach. Anything else?

## 2015-01-19 NOTE — Telephone Encounter (Signed)
Has she tried the Zomig sample yet for pain during the day? She can take Naproxen 500mg  BID, but any daytime medication should only be taken 2-3 times a week. Pls let her know again that there is no instant medication for headaches, she has to give the amitriptyline some time. Thanks

## 2015-01-19 NOTE — Telephone Encounter (Signed)
She wants to know if you can give her something to take during the day for her head pain. She states that she has had a headache since yesterday. She says it's hard for to work when she has these headaches during wants something where they can be more controlled if she gets this pain during the day.

## 2015-01-24 ENCOUNTER — Other Ambulatory Visit: Payer: Self-pay | Admitting: *Deleted

## 2015-01-24 ENCOUNTER — Telehealth: Payer: Self-pay | Admitting: Neurology

## 2015-01-24 DIAGNOSIS — R55 Syncope and collapse: Secondary | ICD-10-CM

## 2015-01-24 NOTE — Telephone Encounter (Signed)
Lmovm to return my call. 

## 2015-01-24 NOTE — Telephone Encounter (Signed)
[name] from [facility]/ called for order of a heart monitor for/ Tasha Ross// call back @ [phone]

## 2015-01-24 NOTE — Telephone Encounter (Signed)
Pls see below, thanks! 

## 2015-01-25 ENCOUNTER — Emergency Department (HOSPITAL_COMMUNITY)
Admission: EM | Admit: 2015-01-25 | Discharge: 2015-01-25 | Disposition: A | Discharge status: Home / Self Care | Payer: BLUE CROSS/BLUE SHIELD | Attending: Emergency Medicine | Admitting: Emergency Medicine

## 2015-01-25 ENCOUNTER — Ambulatory Visit (INDEPENDENT_AMBULATORY_CARE_PROVIDER_SITE_OTHER): Payer: BLUE CROSS/BLUE SHIELD

## 2015-01-25 ENCOUNTER — Encounter (HOSPITAL_COMMUNITY): Payer: Self-pay | Admitting: General Practice

## 2015-01-25 DIAGNOSIS — M545 Low back pain, unspecified: Secondary | ICD-10-CM

## 2015-01-25 DIAGNOSIS — N760 Acute vaginitis: Secondary | ICD-10-CM | POA: Insufficient documentation

## 2015-01-25 DIAGNOSIS — R1030 Lower abdominal pain, unspecified: Secondary | ICD-10-CM | POA: Diagnosis present

## 2015-01-25 DIAGNOSIS — R55 Syncope and collapse: Secondary | ICD-10-CM

## 2015-01-25 DIAGNOSIS — G43909 Migraine, unspecified, not intractable, without status migrainosus: Secondary | ICD-10-CM | POA: Diagnosis not present

## 2015-01-25 DIAGNOSIS — B9689 Other specified bacterial agents as the cause of diseases classified elsewhere: Secondary | ICD-10-CM

## 2015-01-25 LAB — CBC WITH DIFFERENTIAL/PLATELET
Basophils Absolute: 0 10*3/uL (ref 0.0–0.1)
Basophils Relative: 0 %
EOS PCT: 0 %
Eosinophils Absolute: 0 10*3/uL (ref 0.0–0.7)
HCT: 36.4 % (ref 36.0–46.0)
Hemoglobin: 12.8 g/dL (ref 12.0–15.0)
LYMPHS ABS: 1.8 10*3/uL (ref 0.7–4.0)
LYMPHS PCT: 18 %
MCH: 29.8 pg (ref 26.0–34.0)
MCHC: 35.2 g/dL (ref 30.0–36.0)
MCV: 84.7 fL (ref 78.0–100.0)
MONOS PCT: 9 %
Monocytes Absolute: 0.9 10*3/uL (ref 0.1–1.0)
Neutro Abs: 7.2 10*3/uL (ref 1.7–7.7)
Neutrophils Relative %: 73 %
PLATELETS: 319 10*3/uL (ref 150–400)
RBC: 4.3 MIL/uL (ref 3.87–5.11)
RDW: 13.3 % (ref 11.5–15.5)
WBC: 9.9 10*3/uL (ref 4.0–10.5)

## 2015-01-25 LAB — WET PREP, GENITAL
TRICH WET PREP: NONE SEEN
WBC, Wet Prep HPF POC: NONE SEEN
Yeast Wet Prep HPF POC: NONE SEEN

## 2015-01-25 LAB — URINALYSIS, ROUTINE W REFLEX MICROSCOPIC
BILIRUBIN URINE: NEGATIVE
Glucose, UA: NEGATIVE mg/dL
HGB URINE DIPSTICK: NEGATIVE
Ketones, ur: NEGATIVE mg/dL
Leukocytes, UA: NEGATIVE
Nitrite: NEGATIVE
PROTEIN: NEGATIVE mg/dL
Specific Gravity, Urine: 1.026 (ref 1.005–1.030)
UROBILINOGEN UA: 0.2 mg/dL (ref 0.0–1.0)
pH: 5.5 (ref 5.0–8.0)

## 2015-01-25 LAB — COMPREHENSIVE METABOLIC PANEL
ALK PHOS: 42 U/L (ref 38–126)
ALT: 11 U/L — AB (ref 14–54)
AST: 17 U/L (ref 15–41)
Albumin: 3.7 g/dL (ref 3.5–5.0)
Anion gap: 7 (ref 5–15)
BUN: 9 mg/dL (ref 6–20)
CALCIUM: 8.9 mg/dL (ref 8.9–10.3)
CHLORIDE: 108 mmol/L (ref 101–111)
CO2: 24 mmol/L (ref 22–32)
CREATININE: 0.65 mg/dL (ref 0.44–1.00)
Glucose, Bld: 80 mg/dL (ref 65–99)
Potassium: 3.7 mmol/L (ref 3.5–5.1)
Sodium: 139 mmol/L (ref 135–145)
Total Bilirubin: 0.6 mg/dL (ref 0.3–1.2)
Total Protein: 7.5 g/dL (ref 6.5–8.1)

## 2015-01-25 LAB — LIPASE, BLOOD: LIPASE: 21 U/L — AB (ref 22–51)

## 2015-01-25 MED ORDER — ONDANSETRON 4 MG PO TBDP
8.0000 mg | ORAL_TABLET | Freq: Once | ORAL | Status: AC
  Administered 2015-01-25: 8 mg via ORAL
  Filled 2015-01-25: qty 2

## 2015-01-25 MED ORDER — OXYCODONE-ACETAMINOPHEN 5-325 MG PO TABS
1.0000 | ORAL_TABLET | Freq: Once | ORAL | Status: AC
  Administered 2015-01-25: 1 via ORAL
  Filled 2015-01-25: qty 1

## 2015-01-25 MED ORDER — METRONIDAZOLE 500 MG PO TABS: 500.0000 mg | ORAL_TABLET | Freq: Two times a day (BID) | ORAL | Status: DC

## 2015-01-25 NOTE — Telephone Encounter (Signed)
I did speak with Tasha Ross at Cardiology and she informed me that the order for patient's 30 day monitor has been put in.

## 2015-01-25 NOTE — Discharge Instructions (Signed)
Bacterial Vaginosis Bacterial vaginosis is a vaginal infection that occurs when the normal balance of bacteria in the vagina is disrupted. It results from an overgrowth of certain bacteria. This is the most common vaginal infection in women of childbearing age. Treatment is important to prevent complications, especially in pregnant women, as it can cause a premature delivery. CAUSES  Bacterial vaginosis is caused by an increase in harmful bacteria that are normally present in smaller amounts in the vagina. Several different kinds of bacteria can cause bacterial vaginosis. However, the reason that the condition develops is not fully understood. RISK FACTORS Certain activities or behaviors can put you at an increased risk of developing bacterial vaginosis, including:  Having a new sex partner or multiple sex partners.  Douching.  Using an intrauterine device (IUD) for contraception. Women do not get bacterial vaginosis from toilet seats, bedding, swimming pools, or contact with objects around them. SIGNS AND SYMPTOMS  Some women with bacterial vaginosis have no signs or symptoms. Common symptoms include:  Grey vaginal discharge.  A fishlike odor with discharge, especially after sexual intercourse.  Itching or burning of the vagina and vulva.  Burning or pain with urination. DIAGNOSIS  Your health care provider will take a medical history and examine the vagina for signs of bacterial vaginosis. A sample of vaginal fluid may be taken. Your health care provider will look at this sample under a microscope to check for bacteria and abnormal cells. A vaginal pH test may also be done.  TREATMENT  Bacterial vaginosis may be treated with antibiotic medicines. These may be given in the form of a pill or a vaginal cream. A second round of antibiotics may be prescribed if the condition comes back after treatment.  HOME CARE INSTRUCTIONS   Only take over-the-counter or prescription medicines as  directed by your health care provider.  If antibiotic medicine was prescribed, take it as directed. Make sure you finish it even if you start to feel better.  Do not have sex until treatment is completed.  Tell all sexual partners that you have a vaginal infection. They should see their health care provider and be treated if they have problems, such as a mild rash or itching.  Practice safe sex by using condoms and only having one sex partner. SEEK MEDICAL CARE IF:   Your symptoms are not improving after 3 days of treatment.  You have increased discharge or pain.  You have a fever. MAKE SURE YOU:   Understand these instructions.  Will watch your condition.  Will get help right away if you are not doing well or get worse. FOR MORE INFORMATION  Centers for Disease Control and Prevention, Division of STD Prevention: SolutionApps.co.za American Sexual Health Association (ASHA): www.ashastd.org  Document Released: 04/21/2005 Document Revised: 02/09/2013 Document Reviewed: 12/01/2012 [facility] Patient Information 2015 [place], [place]. This information is not intended to replace advice given to you by your health care provider. Make sure you discuss any questions you have with your health care provider.   Abdominal Pain, Women Abdominal (stomach, pelvic, or belly) pain can be caused by many things. It is important to tell your doctor:  The location of the pain.  Does it come and go or is it present all the time?  Are there things that start the pain (eating certain foods, exercise)?  Are there other symptoms associated with the pain (fever, nausea, vomiting, diarrhea)? All of this is helpful to know when trying to find the cause of the pain. CAUSES  Stomach: virus or bacteria infection, or ulcer. °· Intestine: appendicitis (inflamed appendix), regional ileitis (Crohn's disease), ulcerative colitis (inflamed colon), irritable bowel syndrome, diverticulitis (inflamed diverticulum of  the colon), or cancer of the stomach or intestine. °· Gallbladder disease or stones in the gallbladder. °· Kidney disease, kidney stones, or infection. °· Pancreas infection or cancer. °· Fibromyalgia (pain disorder). °· Diseases of the female organs: °· Uterus: fibroid (non-cancerous) tumors or infection. °· Fallopian tubes: infection or tubal pregnancy. °· Ovary: cysts or tumors. °· Pelvic adhesions (scar tissue). °· Endometriosis (uterus lining tissue growing in the pelvis and on the pelvic organs). °· Pelvic congestion syndrome (female organs filling up with blood just before the menstrual period). °· Pain with the menstrual period. °· Pain with ovulation (producing an egg). °· Pain with an IUD (intrauterine device, birth control) in the uterus. °· Cancer of the female organs. °· Functional pain (pain not caused by a disease, may improve without treatment). °· Psychological pain. °· Depression. °DIAGNOSIS  °Your doctor will decide the seriousness of your pain by doing an examination. °· Blood tests. °· X-rays. °· Ultrasound. °· CT scan (computed tomography, special type of X-ray). °· MRI (magnetic resonance imaging). °· Cultures, for infection. °· Barium enema (dye inserted in the large intestine, to better view it with X-rays). °· Colonoscopy (looking in intestine with a lighted tube). °· Laparoscopy (minor surgery, looking in abdomen with a lighted tube). °· Major abdominal exploratory surgery (looking in abdomen with a large incision). °TREATMENT  °The treatment will depend on the cause of the pain.  °· Many cases can be observed and treated at home. °· Over-the-counter medicines recommended by your caregiver. °· Prescription medicine. °· Antibiotics, for infection. °· Birth control pills, for painful periods or for ovulation pain. °· Hormone treatment, for endometriosis. °· Nerve blocking injections. °· Physical therapy. °· Antidepressants. °· Counseling with a psychologist or psychiatrist. °· Minor or major  surgery. °HOME CARE INSTRUCTIONS  °· Do not take laxatives, unless directed by your caregiver. °· Take over-the-counter pain medicine only if ordered by your caregiver. Do not take aspirin because it can cause an upset stomach or bleeding. °· Try a clear liquid diet (broth or water) as ordered by your caregiver. Slowly move to a bland diet, as tolerated, if the pain is related to the stomach or intestine. °· Have a thermometer and take your temperature several times a day, and record it. °· Bed rest and sleep, if it helps the pain. °· Avoid sexual intercourse, if it causes pain. °· Avoid stressful situations. °· Keep your follow-up appointments and tests, as your caregiver orders. °· If the pain does not go away with medicine or surgery, you may try: °· Acupuncture. °· Relaxation exercises (yoga, meditation). °· Group therapy. °· Counseling. °SEEK MEDICAL CARE IF:  °· You notice certain foods cause stomach pain. °· Your home care treatment is not helping your pain. °· You need stronger pain medicine. °· You want your IUD removed. °· You feel faint or lightheaded. °· You develop nausea and vomiting. °· You develop a rash. °· You are having side effects or an allergy to your medicine. °SEEK IMMEDIATE MEDICAL CARE IF:  °· Your pain does not go away or gets worse. °· You have a fever. °· Your pain is felt only in portions of the abdomen. The right side could possibly be appendicitis. The left lower portion of the abdomen could be colitis or diverticulitis. °· You are passing blood in your stools (bright   red or black tarry stools, with or without vomiting).  You have blood in your urine.  You develop chills, with or without a fever.  You pass out. MAKE SURE YOU:   Understand these instructions.  Will watch your condition.  Will get help right away if you are not doing well or get worse. Document Released: 02/16/2007 Document Revised: 09/05/2013 Document Reviewed: 03/08/2009 [facility] Patient Information  2015 [place], [place]. This information is not intended to replace advice given to you by your health care provider. Make sure you discuss any questions you have with your health care provider.  Back Pain, Adult Low back pain is very common. About 1 in 5 people have back pain.The cause of low back pain is rarely dangerous. The pain often gets better over time.About half of people with a sudden onset of back pain feel better in just 2 weeks. About 8 in 10 people feel better by 6 weeks.  CAUSES Some common causes of back pain include:  Strain of the muscles or ligaments supporting the spine.  Wear and tear (degeneration) of the spinal discs.  Arthritis.  Direct injury to the back. DIAGNOSIS Most of the time, the direct cause of low back pain is not known.However, back pain can be treated effectively even when the exact cause of the pain is unknown.Answering your caregiver's questions about your overall health and symptoms is one of the most accurate ways to make sure the cause of your pain is not dangerous. If your caregiver needs more information, he or she may order lab work or imaging tests (X-rays or MRIs).However, even if imaging tests show changes in your back, this usually does not require surgery. HOME CARE INSTRUCTIONS For many people, back pain returns.Since low back pain is rarely dangerous, it is often a condition that people can learn to [facility] their own.   Remain active. It is stressful on the back to sit or stand in one place. Do not sit, drive, or stand in one place for more than 30 minutes at a time. Take short walks on level surfaces as soon as pain allows.Try to increase the length of time you walk each day.  Do not stay in bed.Resting more than 1 or 2 days can delay your recovery.  Do not avoid exercise or work.Your body is made to move.It is not dangerous to be active, even though your back may hurt.Your back will likely heal faster if you return to being active  before your pain is gone.  Pay attention to your body when you bend and lift. Many people have less discomfortwhen lifting if they bend their knees, keep the load close to their bodies,and avoid twisting. Often, the most comfortable positions are those that put less stress on your recovering back.  Find a comfortable position to sleep. Use a firm mattress and lie on your side with your knees slightly bent. If you lie on your back, put a pillow under your knees.  Only take over-the-counter or prescription medicines as directed by your caregiver. Over-the-counter medicines to reduce pain and inflammation are often the most helpful.Your caregiver may prescribe muscle relaxant drugs.These medicines help dull your pain so you can more quickly return to your normal activities and healthy exercise.  Put ice on the injured area.  Put ice in a plastic bag.  Place a towel between your skin and the bag.  Leave the ice on for 15-20 minutes, 03-04 times a day for the first 2 to 3 days. After  that, ice and heat may be alternated to reduce pain and spasms.  Ask your caregiver about trying back exercises and gentle massage. This may be of some benefit.  Avoid feeling anxious or stressed.Stress increases muscle tension and can worsen back pain.It is important to recognize when you are anxious or stressed and learn ways to manage it.Exercise is a great option. SEEK MEDICAL CARE IF:  You have pain that is not relieved with rest or medicine.  You have pain that does not improve in 1 week.  You have new symptoms.  You are generally not feeling well. SEEK IMMEDIATE MEDICAL CARE IF:   You have pain that radiates from your back into your legs.  You develop new bowel or bladder control problems.  You have unusual weakness or numbness in your arms or legs.  You develop nausea or vomiting.  You develop abdominal pain.  You feel faint. Document Released: 04/21/2005 Document Revised: 10/21/2011  Document Reviewed: 08/23/2013 [facility] Patient Information 2015 [place], [place]. This information is not intended to replace advice given to you by your health care provider. Make sure you discuss any questions you have with your health care provider.

## 2015-01-25 NOTE — ED Provider Notes (Signed)
CSN: [id number]     Arrival date & time 01/25/15  0906 History   First MD Initiated Contact with Patient 01/25/15 0919     Chief Complaint  Patient presents with  . Abdominal Pain   HPI  Ms. Tasha Ross is a 39 year old female presenting with lower abdominal pain and back pain. Pt states that the pain is intermittent over the past 4 days. She states that the pain comes on suddenly and then eases off within 15 minutes. The abdominal pain is sharp and over the bilateral lower quadrants. She also reports lumbar back pain that occurs with the abdominal pain. The back pain is "like a muscle spasm". She has not tried any OTC medications for pain relief. Also endorses nausea, reports she has chronic nausea 2/2 to her migraines. Denies fevers, chills, flank pain, dysuria, hematuria, changes in urinary frequency, urinary/bowel incontinence, vaginal discharge, leg weakness or leg or groin numbness.    Past Medical History  Diagnosis Date  . Migraine   . Vertigo    Past Surgical History  Procedure Laterality Date  . Vaginal hysterectomy     Family History  Problem Relation Age of Onset  . Cancer Father   . Hypertension    . Lupus    . Diabetes     Social History  Substance Use Topics  . Smoking status: Never Smoker   . Smokeless tobacco: Never Used  . Alcohol Use: 0.0 oz/week    0 Standard drinks or equivalent per week   OB History    No data available     Review of Systems  Constitutional: Negative for fever, chills and diaphoresis.  Gastrointestinal: Positive for nausea and abdominal pain. Negative for vomiting, diarrhea and constipation.  Genitourinary: Negative for dysuria, hematuria, flank pain, vaginal discharge, vaginal pain and pelvic pain.  Musculoskeletal: Positive for back pain.  Neurological: Negative for weakness and numbness.      Allergies  Sulfur and Ibuprofen  Home Medications   Prior to Admission medications   Medication Sig Start Date End Date Taking?  Authorizing Provider  amitriptyline (ELAVIL) 25 MG tablet Take 1 tablet at bedtime for 1 week, then increase to 2 tablets at bedtime 01/17/15  Yes Van Clines, MD  metroNIDAZOLE (FLAGYL) 500 MG tablet Take 1 tablet (500 mg total) by mouth 2 (two) times daily. 01/25/15   Corrisa Gibby, PA-C  SUMAtriptan (IMITREX) 50 MG tablet Take 1 tablet at the onset of headache. May repeat in 2 hours if needed. Do not take more than 3 a week. 01/19/15   Van Clines, MD  zolmitriptan (ZOMIG) 5 MG nasal solution Place 1 spray into the nose as needed for migraine. 01/17/15   Van Clines, MD   BP 99/62 mmHg  Pulse 91  Temp(Src) 99.3 F (37.4 C) (Oral)  Resp 18  Ht 5\' 2"  (1.575 m)  Wt 145 lb (65.772 kg)  BMI 26.51 kg/m2  SpO2 98% Physical Exam  Constitutional: She appears well-developed and well-nourished. No distress.  HENT:  Head: Normocephalic and atraumatic.  Eyes: Conjunctivae are normal. No scleral icterus.  Neck: Normal range of motion.  Cardiovascular: Normal rate, regular rhythm, normal heart sounds and intact distal pulses.   Pulmonary/Chest: Effort normal and breath sounds normal. No respiratory distress. She has no wheezes.  Abdominal: Soft. Bowel sounds are normal. She exhibits no distension. There is tenderness (generalized). There is no rebound and no guarding.  No CVA tenderness  Genitourinary: There is no rash on  the right labia. There is no rash on the left labia. Cervix exhibits no motion tenderness, no discharge and no friability. Right adnexum displays no mass and no tenderness. Left adnexum displays no mass and no tenderness. There is tenderness (pt moderately uncomfortable on exam) in the vagina. No erythema in the vagina. Vaginal discharge: Moderate amount of thick, white discharge.  Musculoskeletal:  No TTP over lumbar spine. No paraspinous spasms  Neurological: She is alert.  5/5 motor strength BLE. Sensation to light touch intact BLE. Pt walks with a steady gait.  Skin: Skin  is warm and dry.  Psychiatric: She has a normal mood and affect. Her behavior is normal.    ED Course  Procedures (including critical care time) Labs Review Labs Reviewed  WET PREP, GENITAL - Abnormal; Notable for the following:    Clue Cells Wet Prep HPF POC MODERATE (*)    All other components within normal limits  COMPREHENSIVE METABOLIC PANEL - Abnormal; Notable for the following:    ALT 11 (*)    All other components within normal limits  URINALYSIS, ROUTINE W REFLEX MICROSCOPIC (NOT AT [facility]) - Abnormal; Notable for the following:    APPearance HAZY (*)    All other components within normal limits  LIPASE, BLOOD - Abnormal; Notable for the following:    Lipase 21 (*)    All other components within normal limits  CBC WITH DIFFERENTIAL/PLATELET  GC/CHLAMYDIA PROBE AMP ([place]) NOT AT [facility]    Imaging Review No results found. I have personally reviewed and evaluated these images and lab results as part of my medical decision-making.   EKG Interpretation None      MDM   Final diagnoses:  Bacterial vaginosis  Lower abdominal pain  Bilateral low back pain without sciatica   Pt presenting with intermittent abdominal and back pain as described above. VSS. Pt nontoxic appearing. Abdomen is soft, non-distended, non-tender. Lumbar back non-tender. Pelvic exam moderately uncomfortable for pt. Moderate amount of thick, white discharge. No CMT. WBC normal. Wet prep shows moderate clue cells. No indications for imaging at this time. Will treat pt for BV with flagyl. Pt to follow up with PCP if symptoms do not resolve. Return precautions given in discharge paperwork and discussed with pt at bedside. Pt stable for discharge      Alveta Heimlich, PA-C 01/25/15 1747  Lyndal Pulley, MD 01/26/15 [phone]

## 2015-01-25 NOTE — ED Notes (Signed)
Pt complaining of abdominal and back pain that started Monday night. Pt describes pain as sharp 8/10. Pt denies chest pain. Pt reporting feeling nauseated with no vomiting.

## 2015-01-26 ENCOUNTER — Telehealth: Payer: Self-pay | Admitting: *Deleted

## 2015-01-26 LAB — GC/CHLAMYDIA PROBE AMP (~~LOC~~) NOT AT ARMC
Chlamydia: NEGATIVE
NEISSERIA GONORRHEA: NEGATIVE

## 2015-01-26 NOTE — Telephone Encounter (Signed)
Received fax from [employer] transmission from 12:05 AM (CT) 01/26/2015 Report states pt asymptomatic; talking on the phone at home.  Report anaylsis sinus tachycardia  Reviewed with Dr. Myrtis Ser, DOD Per Dr. Myrtis Ser " asymptomatic SVT.  Pt on phone; arrange cardiology office new patient appointment next week"  [facility], spoke with [facility], informed.  Received fax # for Dr. Rosalyn Gess nurse, Tiffany.  [phone]. Faxed report at this time.  Confirmed receipt via phone with [facility].

## 2015-02-02 ENCOUNTER — Telehealth: Payer: Self-pay | Admitting: Neurology

## 2015-02-02 NOTE — Telephone Encounter (Signed)
Discussed echo and holter results, holter showed asymptomatic SVT. It was noted to arrange cardiology appt for next week by Dr. Myrtis Ser, patient has not heard from cards yet.   Tiff, pls send referral to Cardiology, pls put on consult that pt had abnormal holter and per notes to see Cards in a week. Advised pt to go to ER if any cardiac symptoms. She states headache unchanged on amitriptyline 50mg  qhs, but only increased last week. Continue for now.

## 2015-02-06 ENCOUNTER — Encounter: Payer: Self-pay | Admitting: *Deleted

## 2015-02-06 ENCOUNTER — Telehealth: Payer: Self-pay | Admitting: Family Medicine

## 2015-02-06 DIAGNOSIS — R42 Dizziness and giddiness: Secondary | ICD-10-CM | POA: Insufficient documentation

## 2015-02-06 DIAGNOSIS — R9431 Abnormal electrocardiogram [ECG] [EKG]: Secondary | ICD-10-CM

## 2015-02-06 NOTE — Telephone Encounter (Signed)
Called patient to let her know she had been scheduled with Cardiology on Thursday 10/6 @ 8:00 am at [facility] location.

## 2015-02-07 ENCOUNTER — Telehealth: Payer: Self-pay | Admitting: *Deleted

## 2015-02-07 ENCOUNTER — Encounter: Payer: Self-pay | Admitting: Physician Assistant

## 2015-02-07 DIAGNOSIS — I428 Other cardiomyopathies: Secondary | ICD-10-CM | POA: Insufficient documentation

## 2015-02-07 NOTE — Progress Notes (Signed)
Cardiology Office Note Date:  02/08/2015  Patient ID:  Tasha, Ross Feb 03, 1976, MRN [id number] PCP:  Lupe Carney, MD  Cardiologist:  New to Dr. Tenny Craw  Chief Complaint: abnormal event monitor/echocardiogram  History of Present Illness: Tasha Ross is a 39 y.o. female with history of vertigo, headaches, recurrent near syncope (starting in 2009-2010), and recently noted SVT/LV dysfunction who presents for new patient evaluation.  Per review of neurology note from 01/17/15, "She had seen neurologist Dr. Shane Crutch in 2012 while living in [place], reporting that she is having the exact same symptoms she had, they had gotten under control while she was seeing him, with no episodes for a yearuntil January 2016 as she was transitioning for her move to [place] last March. She reports migraines followed a couple of days later by vertigo. She would brace herself, trying to sit still, but would lose consciousness. She has fallen to the floor at work. Sometimes she can brace herself, other times there is no warning. Symptoms are brief, she would wake up on the floor. She was admitted to [facility] last 10/22/14 for near syncope, she reported right-sided weakness with slurred speech, that improved during transport. Glucose noted to be 72. She had an MRI brain without contrast with no acute intracranial changes. MRA brain reported a 1.25mm bulbous appearance of the distal left PICA branches in the region of the inferior cerebellar vermis. It may represent a vascular loop, however a small aneurysm is not entirely excluded. 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 Records from her previous neurologist were reviewed. He was concerned that dizziness and vertigo were not physiological in nature. He had done an MRI/MRA brain reported as normal as well. TSH, RPR, CBC normal. B12 low normal at 278, she had received replacement treatment. For migraine prophylaxis, she  was started on amitriptyline in 2012, then switched to Topamax in 2013. She recalls taking amitriptyline and Topamax, and states these were stopped because the episodes stopped. She recalls taking Imitrex, Fioricet, Maxalt which would help initially. She feels the amitriptyline and citalopram in the past helped put her in a calm mode. She has been having more frequent migraines occurring twice a week, lasting 3-4 hours. Ibuprofen does not help. The vertigo occurs a couple of days after a migraine, with spinning sensation lasting up to a week."  Recent CBC/CMET 01/25/15 were unremarkable. Neurology started amitriptyline and arranged for 30-day event monitor and 2-D echocardiogram. 2D Echo 01/19/15: mildly dilated LV, EF 45-50% with diffuse HK. 30-day event monitor thus far has picked up an episode of SVT during which the patient was completely asymptomatic (01/25/15 at 11:54PM, talking on phone - HR 180-190). She also has has pushed her symptom button for chest pressure, SOB, and near-syncope during episodes that demonstrate NSR and sinus tachycardia (up to 120s). She reports episodes of intermittent fleeting chest discomfort that lasts only a few seconds, relieved by taking one deep breath. The discomfort is not worse or brought on by inspiration, palpation or exertion. It tends to occur when she is lying down doing nothing at all. She denies any exertional anginal symptoms. She also continues to report intermittent episodes of near-syncope. To [place], she denies history of fully losing consciousness. These episodes always occur while standing, and usually at work. She says these episodes don't tend to happen when she is at home. She describes it as feeling dizzy and needing to "brace herself" which usually involves sitting down quietly. She  denies associated chest pain, dyspnea and palpitations around these particular episodes. She describes periods of time where she has weeks of vertigo occasionally associated with  floaters in her eyes, headaches, and poor appetite. This prodrome typically gives her warning that she may experience an episode of near-syncope in the next few days.   She has no history of tobacco or drug use. She drinks rare alcohol. Family history is negative for CAD except for possible heart disease in a maternal aunt, but details are unclear. No LEE, orthopnea, PND, weight changes.  Orthostatics done in clinic today: P 75, BP 100/80 P 87, BP 98/78 P 93, BP 94/76 P 100, BP 94/68   Past Medical History  Diagnosis Date  . Migraine   . Vertigo   . Near syncope   . SVT (supraventricular tachycardia) (HCC)     a. Seen on event monitor 01/2015.  . Cardiomyopathy (HCC)     a. Echo 01/2015: EF 45-50% with diffuse HK.    Past Surgical History  Procedure Laterality Date  . Tubal ligation Bilateral   . Cystectomy      from ovaries  . Vaginal hysterectomy      Current Outpatient Prescriptions  Medication Sig Dispense Refill  . amitriptyline (ELAVIL) 25 MG tablet Take 1 tablet by mouth at bedtime for 1 week, then increase to 2 tablets by mouth at bedtime    . SUMAtriptan (IMITREX) 50 MG tablet Take one tablet by mouth one time at the onset of a headache. May repeat in 2 hours if headache persists or recurs. Do not take more than 3 a week.     No current facility-administered medications for this visit.    Allergies:   Sulfur and Ibuprofen   Social History:  The patient  reports that she has never smoked. She has never used smokeless tobacco. She reports that she drinks alcohol. She reports that she does not use illicit drugs.   Family History:  The patient's family history includes Cancer in her father; Diabetes in an other family member; Heart disease in her maternal aunt; Hypertension in an other family member; Lupus in an other family member. There is no history of Stroke, Fainting, Migraines, or Seizures.  ROS:  Please see the history of present illness. All other systems are  reviewed and otherwise negative.   PHYSICAL EXAM:  VS:  BP 98/68 mmHg  Pulse 79  Ht 5\' 2"  (1.575 m)  Wt 140 lb 9.6 oz (63.776 kg)  BMI 25.71 kg/m2 BMI: Body mass index is 25.71 kg/(m^2). Well nourished, well developed AAF, in no acute distress HEENT: normocephalic, atraumatic Neck: no JVD, carotid bruits or masses Cardiac:  normal S1, S2; RRR; no murmurs, rubs, or gallops Lungs:  clear to auscultation bilaterally, no wheezing, rhonchi or rales Abd: soft, nontender, no hepatomegaly, + BS MS: no deformity or atrophy Ext: no edema Skin: warm and dry, no rash Neuro:  moves all extremities spontaneously, no focal abnormalities noted, follows commands Psych: euthymic mood, full affect  EKG:  Done today shows NSR 79bpm, nonspecific ST-T change in lea III  Recent Labs: 01/25/2015: ALT 11*; BUN 9; Creatinine, Ser 0.65; Hemoglobin 12.8; Platelets 319; Potassium 3.7; Sodium 139  No results found for requested labs within last 365 days.   Estimated Creatinine Clearance: 83.7 mL/min (by C-G formula based on Cr of 0.65).   Wt Readings from Last 3 Encounters:  02/08/15 140 lb 9.6 oz (63.776 kg)  01/25/15 145 lb (65.772 kg)  01/17/15 143  lb 6 oz (65.034 kg)     Other studies reviewed: Additional studies/records reviewed today include: summarized above  ASSESSMENT AND PLAN:  The patient was seen and examined by myself and Dr. Tenny Craw since she is a new patient.  1. SVT - incidentally noted on recent event monitor. She was actually completely asymptomatic with this episode. BP is too low at baseline to place on empiric BB therapy especially since she is asymptomatic from this arrhythmia. Will finish out event monitor and observe for recurrent episodes. Baseline HR today is in the 70s. Tendency towards sinus tach on recent event monitoring may be partially due to amitriptyline.  2. Cardiomyopathy - unclear etiology. No evidence of clinical CHF on exam. Per d/w MD, will proceed with cardiac MRI to  further evaluate. 3. Recurrent near-syncope - sounds orthostatic versus vasovagal in nature. Her blood pressure is 98/68, which she reports is chronic for her. Orthostatic VS did show an elevation in HR from 75->100bpm (BP 100/80->94/68). We have instructed her to liberalize her fluid and salt intake to help boost blood pressure reserves. Continue event monitor. If this persists despite fluid/salt increase, may consider evaluation for adrenal insufficiency. 4. Chest pain - atypical. She does not have any known significant cardiac risk factors. Await cardiac MRI results.  Disposition: F/u with Dr. Tenny Craw or APP on a day when Dr. Tenny Craw is in clinic in 4-6 weeks.  Current medicines are reviewed at length with the patient today.  The patient did not have any concerns regarding medicines.  Thomasene Mohair PA-C 02/08/2015 8:39 AM     [facility] [address] [place] [address] [phone] (office)  [phone] (fax)

## 2015-02-07 NOTE — Telephone Encounter (Signed)
Arlys John from [employer] called to report that pt had an episode of Sinus tach. the monitor EKG show ST  104 to 142 beat/minute. At the time of transmission, pt pushed the passed out button.  I spoke with pt she states that at the time she was at work, and all the sudden she felt like she was going to passed out; a coworker helped her to sit down. Pt feels much better now. Pt  is aware that we will get the  EKG strip and will let the MD know.

## 2015-02-08 ENCOUNTER — Encounter: Payer: Self-pay | Admitting: Physician Assistant

## 2015-02-08 ENCOUNTER — Ambulatory Visit (INDEPENDENT_AMBULATORY_CARE_PROVIDER_SITE_OTHER): Payer: BLUE CROSS/BLUE SHIELD | Admitting: Physician Assistant

## 2015-02-08 VITALS — BP 98/68 | HR 79 | Ht 62.0 in | Wt 140.6 lb

## 2015-02-08 DIAGNOSIS — I429 Cardiomyopathy, unspecified: Secondary | ICD-10-CM

## 2015-02-08 DIAGNOSIS — R079 Chest pain, unspecified: Secondary | ICD-10-CM | POA: Diagnosis not present

## 2015-02-08 DIAGNOSIS — R55 Syncope and collapse: Secondary | ICD-10-CM | POA: Diagnosis not present

## 2015-02-08 DIAGNOSIS — I471 Supraventricular tachycardia, unspecified: Secondary | ICD-10-CM

## 2015-02-08 NOTE — Patient Instructions (Signed)
Medication Instructions:  Your physician recommends that you continue on your current medications as directed. Please refer to the Current Medication list given to you today.   Labwork: None ordered  Testing/Procedures: Your physician has requested that you have a cardiac MRI. Cardiac MRI uses a computer to create images of your heart as its beating, producing both still and moving pictures of your heart and major blood vessels. For further information please visit [email or web address]. Please follow the instruction sheet given to you today for more information.   Follow-Up: Your physician recommends that you schedule a follow-up appointment in: 4-6 weeks with Dr.Ross or an APP   Any Other Special Instructions Will Be Listed Below (If Applicable). Increase your fluid and sodium intake

## 2015-02-08 NOTE — Telephone Encounter (Signed)
Per Dr. Graciela Husbands, faxed monitor results to Dr. Karel Jarvis at [phone].

## 2015-02-19 ENCOUNTER — Telehealth: Payer: Self-pay

## 2015-02-19 NOTE — Telephone Encounter (Signed)
Patient had a report sent from her monitor on Friday 4:47 pm. Patient was having pressure in her chest and feeling dizzy. Patient stated that she was so dizzy, that she could not walk. Patient went to [facility] that resulted in a negative work up, per patient. Dr. Johney Frame signed off on report, RR Tachycardia and report to be shown to Dr. Tenny Craw. Report in Dr. Charlott Rakes box.   Called patient and left a message to see if she can have [facility] to send our office a copy of records from Friday.

## 2015-03-01 ENCOUNTER — Ambulatory Visit (HOSPITAL_COMMUNITY): Admission: RE | Admit: 2015-03-01 | Payer: BLUE CROSS/BLUE SHIELD | Source: Ambulatory Visit

## 2015-03-01 ENCOUNTER — Telehealth: Payer: Self-pay | Admitting: Internal Medicine

## 2015-03-01 NOTE — Telephone Encounter (Signed)
Spoke w/pt on 02-28-15 informing pt Cardiac MRI in appeals and will call in a.m. Spoke [email or web address]  [place] [phone] @8 :45 a.m. They are awaiting faxed clinicals to be received and processed for appeal denied by [employer].  Left msg for pt Cardiac MRI cancelled for now.  When pt calls back will inform to keep appt w/PA, Tereso Newcomer 03-19-15 as this appt is also to discuss heart monitor.

## 2015-03-06 ENCOUNTER — Telehealth: Payer: Self-pay | Admitting: Internal Medicine

## 2015-03-07 ENCOUNTER — Ambulatory Visit: Payer: BLUE CROSS/BLUE SHIELD | Admitting: Nurse Practitioner

## 2015-03-07 NOTE — Telephone Encounter (Signed)
Spoke with patient. She has taken something in the past for MRI but cant remember which medicine  She will be arriving at the hospital at 3pm to have lab work prior.   Pt is aware I am forwarding to Dr. Tenny Craw for recommendation and will call her back with a response.

## 2015-03-07 NOTE — Telephone Encounter (Signed)
Too late. Could try Xanax 0.25 x 1

## 2015-03-07 NOTE — Telephone Encounter (Signed)
New message   Pt wants medication for her to be relaxed while having her MRI she is claustrophobic

## 2015-03-08 NOTE — Progress Notes (Signed)
Cardiology Office Note   Date:  03/09/2015   ID:  Tasha Ross, DOB [birth year], MRN [id number]  PCP:  Lupe Carney, MD  Cardiologist:  Dr. Dietrich Pates     Chief Complaint  Patient presents with  . Follow-up    SVT, dizziness     History of Present Illness: Tasha Ross is a 39 y.o. female with a hx of BPPV, migraine HAs, near syncope/?syncope.  She recently moved to [place] from [place].  She was admitted to [facility] last June for a near syncopal episode with right leg weakness, felt to have a conversion reaction. She established with Neurology (Dr. Karel Jarvis) and Echo and event monitor were arranged.  Echo demonstrated EF 45-50% with diffuse hypokinesis. Event monitor demonstrated an episode of SVT with heart rates 180-190. This was apparently asymptomatic.  She was referred to Cardiology and seen by Ronie Spies, PA-C and Dr. Dietrich Pates 02/08/15.  Patient was noted to have episodes of sinus tachycardia on the event monitor. Amitriptyline was hypothesized as a potential cause. Etiology of cardiomyopathy was not clear. She had no evidence of CHF on exam. Cardiac MRI was recommended  However, her insurance was denying this test. There was no evidence of orthostatic hypotension. However, she did have significant increase in heart rate (75>> 100) from lying to standing. Patient was instructed to liberalize fluids and salt. Of note, she did have some atypical chest pain. No further workup was recommended as cardiac MRI was to be done. Recommendation was to follow-up with Dr. Tenny Craw.   She returns for FU.  MRI has been approved.  She is getting the test today.  She had one other episode of chest pain and syncope. This occurred 10/14.  Review of her monitor demonstrates tachycardia on 10/14 with HR 175.  She tells me she has these episodes at work only.  She was at work and started to have substernal chest pressure and dizziness/near syncope.  She did have loss of conscousness.  She does not  recall falling to the ground.  She was taken to [facility] ED.  CEs were neg.  ECG was "normal."  Head CT and LE venous duplex was neg.  DDimer was neg.  She denies orthostatic intolerance.  Denies orthopnea, PND, edema. Denies exertional CP.  She notes some DOE (NYHA 2).  She denies dizziness with abrupt standing. She does get vertiginous symptoms with sudden changes in head position.     Studies/Reports Reviewed Today:  Event monitor 01/25/15  Normal Sinus Rhythm and snius tachcyardia with heart rate ranging from 50 to 121  SVT up to 193 bpm  Rare PVC  Echo 01/19/15 EF 45-50%, diffuse HK   Past Medical History  Diagnosis Date  . Migraine   . Vertigo   . Near syncope   . SVT (supraventricular tachycardia) (HCC)     a. Seen on event monitor 01/2015.  . Cardiomyopathy (HCC)     a. Echo 01/2015: EF 45-50% with diffuse HK.    Past Surgical History  Procedure Laterality Date  . Tubal ligation Bilateral   . Cystectomy      from ovaries  . Vaginal hysterectomy       Current Outpatient Prescriptions  Medication Sig Dispense Refill  . amitriptyline (ELAVIL) 25 MG tablet Take 1 tablet by mouth at bedtime for 1 week, then increase to 2 tablets by mouth at bedtime    . SUMAtriptan (IMITREX) 50 MG tablet Take one tablet by mouth one time at the  onset of a headache. May repeat in 2 hours if headache persists or recurs. Do not take more than 3 a week.     No current facility-administered medications for this visit.    Allergies:   Sulfur and Ibuprofen    Social History:   Social History   Social History  . Marital Status: Single    Spouse Name: N/A  . Number of Children: 4  . Years of Education: N/A   Social History Main Topics  . Smoking status: Never Smoker   . Smokeless tobacco: Never Used  . Alcohol Use: 0.0 oz/week    0 Standard drinks or equivalent per week     Comment: Rare - once or twice a month.  . Drug Use: No  . Sexual Activity: Not Asked   Other Topics  Concern  . None   Social History Narrative   Lives with 3 of her sons.  Has 4 sons.  Works for [employer].  Education: some college.        Family History:   Family History  Problem Relation Age of Onset  . Cancer Father   . Hypertension    . Lupus    . Diabetes    . Stroke Neg Hx   . Fainting Neg Hx   . Migraines Neg Hx   . Seizures Neg Hx   . Heart disease Maternal Aunt     Patient does not know details      ROS:   Please see the history of present illness.   Review of Systems  Constitution: Positive for decreased appetite.  HENT: Positive for headaches.   Cardiovascular: Positive for chest pain.  Neurological: Positive for dizziness.  All other systems reviewed and are negative.     PHYSICAL EXAM: VS:  BP 110/68 mmHg  Pulse 71  Ht  (1.575 m)  Wt 145 lb 12.8 oz (66.134 kg)  BMI 26.66 kg/m2    Wt Readings from Last 3 Encounters:  03/09/15 145 lb 12.8 oz (66.134 kg)  02/08/15 140 lb 9.6 oz (63.776 kg)  01/25/15 145 lb (65.772 kg)     GEN: Well nourished, well developed, in no acute distress HEENT: normal Neck: no JVD,   no masses Cardiac:  Normal S1/S2, RRR; no murmur ,  no rubs or gallops, no edema   Respiratory:  clear to auscultation bilaterally, no wheezing, rhonchi or rales. GI: soft, nontender, nondistended, + BS MS: no deformity or atrophy Skin: warm and dry  Neuro:  CNs II-XII intact, Strength and sensation are intact Psych: Normal affect   EKG:  EKG is ordered today.  It demonstrates:   NSR, HR 71, normal axis, QTc 417 ms, no evidence of pre-excitation    Recent Labs: 01/25/2015: ALT 11*; BUN 9; Creatinine, Ser 0.65; Hemoglobin 12.8; Platelets 319; Potassium 3.7; Sodium 139    Lipid Panel    Component Value Date/Time   CHOL 135 12/22/2008 2028   TRIG 35 12/22/2008 2028   HDL 57 12/22/2008 2028   CHOLHDL 2.4 Ratio 12/22/2008 2028   VLDL 7 12/22/2008 2028   LDLCALC 71 12/22/2008 2028      ASSESSMENT AND PLAN:  1. Sinus  Tachycardia:  I reviewed the patient's telemetry from her monitor with Dr. Graciela Husbands (DOD). It appears that all of her episodes of SVT are actually sinus tachycardia. Her heart rate incrementally increases throughout the episode consistent with orthostatic intolerance. Her heart rates are increased related to sympathetic overdrive. Therefore, recommend continuing to  increase her fluid intake as well as being liberal with salt. She may use salt tablets as well. We have also recommended getting an abdominal binder to wear throughout the day. All of the episodes occur at work. This raises the possibility that her symptoms are brought on by prolonged standing.  The abdominal binder will likely help alleviate her symptoms. She will also be given the website for POTS.  I will obtain a TSH.  2. Cardiomyopathy:  Etiology not clear. MRI pending from today.  3. Chest Pain:  Etiology not clear. Obtain ETT to rule out ischemia and exercise induced arrhythmias.  4. Snoring: She does have symptoms consistent with sleep apnea. Arrange split night sleep study.  5. Syncope:  No clear arrhythmogenic cause given sinus tachycardia during her most recent episode.  Episodes appear to be vasovagal.      Medication Changes: Current medicines are reviewed at length with the patient today.  Concerns regarding medicines are as outlined above.  The following changes have been made:   Discontinued Medications   No medications on file   Modified Medications   No medications on file   New Prescriptions   No medications on file   Labs/ tests ordered today include:   Orders Placed This Encounter  Procedures  . TSH  . Exercise Tolerance Test  . EKG 12-Lead  . Split night study      Disposition:    FU with Dr. Dietrich Pates 6-8 weeks.     Signed, Brynda Rim, MHS 03/09/2015 10:53 AM    [facility] [address] [place], [place], [place]  [address] Phone: [phone]; Fax: [phone]

## 2015-03-08 NOTE — Telephone Encounter (Signed)
Called xanax 0.25 mg x 1 tablet to [facility]. Left a message for the patient to call back to inform.

## 2015-03-09 ENCOUNTER — Ambulatory Visit (INDEPENDENT_AMBULATORY_CARE_PROVIDER_SITE_OTHER): Payer: BLUE CROSS/BLUE SHIELD | Admitting: Physician Assistant

## 2015-03-09 ENCOUNTER — Ambulatory Visit (HOSPITAL_COMMUNITY)
Admission: RE | Admit: 2015-03-09 | Discharge: 2015-03-09 | Disposition: A | Discharge status: Home / Self Care | Payer: BLUE CROSS/BLUE SHIELD | Source: Ambulatory Visit | Attending: Physician Assistant | Admitting: Physician Assistant

## 2015-03-09 ENCOUNTER — Encounter: Payer: Self-pay | Admitting: Physician Assistant

## 2015-03-09 VITALS — BP 110/68 | HR 71 | Ht 62.0 in | Wt 145.8 lb

## 2015-03-09 DIAGNOSIS — I429 Cardiomyopathy, unspecified: Secondary | ICD-10-CM | POA: Diagnosis not present

## 2015-03-09 DIAGNOSIS — I517 Cardiomegaly: Secondary | ICD-10-CM | POA: Diagnosis not present

## 2015-03-09 DIAGNOSIS — I071 Rheumatic tricuspid insufficiency: Secondary | ICD-10-CM | POA: Diagnosis not present

## 2015-03-09 DIAGNOSIS — R55 Syncope and collapse: Secondary | ICD-10-CM

## 2015-03-09 DIAGNOSIS — I951 Orthostatic hypotension: Secondary | ICD-10-CM

## 2015-03-09 DIAGNOSIS — R0683 Snoring: Secondary | ICD-10-CM | POA: Diagnosis not present

## 2015-03-09 DIAGNOSIS — R079 Chest pain, unspecified: Secondary | ICD-10-CM | POA: Diagnosis not present

## 2015-03-09 DIAGNOSIS — R000 Tachycardia, unspecified: Secondary | ICD-10-CM | POA: Diagnosis not present

## 2015-03-09 DIAGNOSIS — I340 Nonrheumatic mitral (valve) insufficiency: Secondary | ICD-10-CM | POA: Diagnosis not present

## 2015-03-09 LAB — CREATININE, SERUM
Creatinine, Ser: 0.66 mg/dL (ref 0.44–1.00)
GFR calc Af Amer: >60 mL/min (ref >60)

## 2015-03-09 LAB — TSH: TSH: 1.233 u[IU]/mL (ref 0.350–4.500)

## 2015-03-09 MED ORDER — GADOBENATE DIMEGLUMINE 529 MG/ML IV SOLN
20.0000 mL | Freq: Once | INTRAVENOUS | Status: AC | PRN
  Administered 2015-03-09: 20 mL via INTRAVENOUS

## 2015-03-09 NOTE — Patient Instructions (Signed)
Medication Instructions:  Your physician recommends that you continue on your current medications as directed. Please refer to the Current Medication list given to you today.   Labwork: TODAY TSH  Testing/Procedures: Your physician has requested that you have an exercise tolerance test. For further information please visit [email or web address] Please also follow instruction sheet, as given.  Your physician has recommended that you have a sleep study. This test records several body functions during sleep, including: brain activity, eye movement, oxygen and carbon dioxide blood levels, heart rate and rhythm, breathing rate and rhythm, the flow of air through your mouth and nose, snoring, body muscle movements, and chest and belly movement.   Follow-Up: DR. Tenny Craw IN 6-8 WEEKS  Any Other Special Instructions Will Be Listed Below (If Applicable). 1. [facility]  2. [email or web address] 3. INCREASE FLUID INTAKE; GATORADE, WATER 4. INCREASE SALT INTAKE; ALSO GET SALT TABS WHICH YOU SHOULD BE ABLE TO FIND AT [facility]    If you need a refill on your cardiac medications before your next appointment, please call your pharmacy.

## 2015-03-12 ENCOUNTER — Telehealth: Payer: Self-pay | Admitting: *Deleted

## 2015-03-12 NOTE — Telephone Encounter (Signed)
Pt notified of lab results by phone with verbal understanding.  

## 2015-03-16 ENCOUNTER — Telehealth: Payer: Self-pay | Admitting: Internal Medicine

## 2015-03-16 NOTE — Telephone Encounter (Signed)
Pt received your voicemail about Cardiac MRI and would like a call on her results.

## 2015-03-23 ENCOUNTER — Encounter: Payer: BLUE CROSS/BLUE SHIELD | Admitting: Cardiology

## 2015-03-23 ENCOUNTER — Telehealth: Payer: Self-pay | Admitting: *Deleted

## 2015-03-23 ENCOUNTER — Ambulatory Visit (INDEPENDENT_AMBULATORY_CARE_PROVIDER_SITE_OTHER): Payer: BLUE CROSS/BLUE SHIELD

## 2015-03-23 ENCOUNTER — Encounter: Payer: Self-pay | Admitting: Physician Assistant

## 2015-03-23 DIAGNOSIS — R079 Chest pain, unspecified: Secondary | ICD-10-CM

## 2015-03-23 DIAGNOSIS — R000 Tachycardia, unspecified: Secondary | ICD-10-CM | POA: Diagnosis not present

## 2015-03-23 LAB — EXERCISE TOLERANCE TEST
CSEPED: 9 min
CSEPEDS: 0 s
CSEPEW: 10 METS
CSEPPHR: 173 {beats}/min
MPHR: 182 {beats}/min
Percent HR: 95 %
RPE: 15
Rest HR: 86 {beats}/min

## 2015-03-23 NOTE — Telephone Encounter (Signed)
I s/w pt about her stress test results, which pt verbalized understanding to results. Pt states that she has been waiting for Dr. Tenny Craw to call her about her MRI results. Pt's cell phone cut off. I see Dr. Tenny Craw lmptc 03/13/15 to go over results.

## 2015-03-27 NOTE — Telephone Encounter (Signed)
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 Would place on low dose b blocker  25 mg Toprol XL   F/U in clinic after new year

## 2015-03-28 ENCOUNTER — Other Ambulatory Visit: Payer: Self-pay | Admitting: *Deleted

## 2015-03-28 ENCOUNTER — Telehealth: Payer: Self-pay | Admitting: Internal Medicine

## 2015-03-28 DIAGNOSIS — I255 Ischemic cardiomyopathy: Secondary | ICD-10-CM

## 2015-03-28 MED ORDER — METOPROLOL SUCCINATE ER 25 MG PO TB24: 25.0000 mg | ORAL_TABLET | Freq: Every day | ORAL | Status: DC

## 2015-03-28 NOTE — Telephone Encounter (Signed)
Notified of CT Morp results.  Sent Rx into [facility] for Toprol XL 25 mg.

## 2015-03-28 NOTE — Telephone Encounter (Signed)
New message  ° ° ° °Patient calling back to speak with nurse from yesterday  °

## 2015-04-05 ENCOUNTER — Ambulatory Visit (HOSPITAL_BASED_OUTPATIENT_CLINIC_OR_DEPARTMENT_OTHER): Payer: BLUE CROSS/BLUE SHIELD | Attending: Physician Assistant | Admitting: Radiology

## 2015-04-05 VITALS — Ht 62.0 in | Wt 147.0 lb

## 2015-04-05 DIAGNOSIS — R51 Headache: Secondary | ICD-10-CM | POA: Diagnosis not present

## 2015-04-05 DIAGNOSIS — R0683 Snoring: Secondary | ICD-10-CM | POA: Insufficient documentation

## 2015-04-05 DIAGNOSIS — R5383 Other fatigue: Secondary | ICD-10-CM | POA: Diagnosis not present

## 2015-04-05 DIAGNOSIS — R000 Tachycardia, unspecified: Secondary | ICD-10-CM | POA: Diagnosis not present

## 2015-04-05 DIAGNOSIS — I951 Orthostatic hypotension: Secondary | ICD-10-CM | POA: Diagnosis not present

## 2015-04-06 ENCOUNTER — Telehealth: Payer: Self-pay | Admitting: Cardiology

## 2015-04-06 ENCOUNTER — Encounter (HOSPITAL_BASED_OUTPATIENT_CLINIC_OR_DEPARTMENT_OTHER): Payer: Self-pay | Admitting: Radiology

## 2015-04-06 DIAGNOSIS — R0683 Snoring: Secondary | ICD-10-CM | POA: Insufficient documentation

## 2015-04-06 HISTORY — DX: Snoring: R06.83

## 2015-04-06 NOTE — Telephone Encounter (Signed)
Attempted to call patient - no answer and could not leave VM.

## 2015-04-06 NOTE — Sleep Study (Signed)
   Patient Name: Tasha Ross, Tasha Ross MRN: [id number] Study Date: 04/05/2015 Gender: Female D.O.B: [birth year] Age (years): 83 Referring Provider: Tereso Newcomer Interpreting Physician: Armanda Magic MD, ABSM RPSGT: Shelah Lewandowsky  Weight (lbs): 147 Height (inches): 62 BMI: 27 Neck Size: 13.00  CLINICAL INFORMATION Sleep Study Type: NPSG Indication for sleep study: Fatigue, Morning Headaches, Snoring Epworth Sleepiness Score: 2  SLEEP STUDY TECHNIQUE As per the AASM Manual for the Scoring of Sleep and Associated Events v2.3 (April 2016) with a hypopnea requiring 4% desaturations. The channels recorded and monitored were frontal, central and occipital EEG, electrooculogram (EOG), submentalis EMG (chin), nasal and oral airflow, thoracic and abdominal wall motion, anterior tibialis EMG, snore microphone, electrocardiogram, and pulse oximetry.  MEDICATIONS Patient's medications include: Elavil, Toprol, Imitrix. Medications self-administered by patient during sleep study : No sleep medicine administered.  SLEEP ARCHITECTURE The study was initiated at 10:06:47 PM and ended at 4:45:31 AM. Sleep onset time was 32.3 minutes and the sleep efficiency was mildly reduced at 80.7%. The total sleep time was 321.9 minutes. Stage REM latency was 81.0 minutes. The patient spent 11.62% of the night in stage N1 sleep, 57.94% in stage N2 sleep, 0.00% in stage N3 and 30.44% in REM. Alpha intrusion was absent. Supine sleep was 23.18%.  RESPIRATORY PARAMETERS The overall apnea/hypopnea index (AHI) was 1.5 per hour. There were 7 total apneas, including 0 obstructive, 7 central and 0 mixed apneas. There were 1 hypopneas and 26 RERAs. The AHI during Stage REM sleep was 0.6 per hour. AHI while supine was 0.0 per hour. The mean oxygen saturation was 95.69%. The minimum SpO2 during sleep was 92.00%. Moderate snoring was noted during this study.  CARDIAC DATA The 2 lead EKG demonstrated sinus rhythm. The  mean heart rate was 75.56 beats per minute. Other EKG findings include: None.  LEG MOVEMENT DATA The total PLMS were 3 with a resulting PLMS index of 0.56. Associated arousal with leg movement index was 0.2 .  IMPRESSIONS - No significant obstructive sleep apnea occurred during this study (AHI = 1.5/h). - No significant central sleep apnea occurred during this study (CAI = 1.3/h). - The patient had minimal or no oxygen desaturation during the study (Min O2 = 92.00%) - The patient snored with Moderate snoring volume. - No cardiac abnormalities were noted during this study. - Clinically significant periodic limb movements did not occur during sleep. No significant associated arousals.  DIAGNOSIS - Snoring  RECOMMENDATIONS - Avoid alcohol, sedatives and other CNS depressants that may result in sleep apnea and disrupt normal sleep architecture. - Sleep hygiene should be reviewed to assess factors that may improve sleep quality. - Weight management and regular exercise should be initiated or continued if appropriate.   Quintella Reichert Diplomate, American Board of Sleep Medicine  ELECTRONICALLY SIGNED ON:  04/06/2015, 11:07 AM [facility] PH: [phone]   FX: [phone] ACCREDITED BY THE AMERICAN ACADEMY OF SLEEP MEDICINE

## 2015-04-06 NOTE — Telephone Encounter (Signed)
Please let patient know that sleep study showed no significant sleep apnea.    

## 2015-04-06 NOTE — Telephone Encounter (Signed)
Patient informed of results.  Stated verbal understanding 

## 2015-04-06 NOTE — Telephone Encounter (Signed)
Follow Up ° °Pt returned call. Please call back  °

## 2015-04-11 ENCOUNTER — Emergency Department (HOSPITAL_COMMUNITY)
Admission: EM | Admit: 2015-04-11 | Discharge: 2015-04-12 | Disposition: A | Discharge status: Home / Self Care | Payer: BLUE CROSS/BLUE SHIELD | Attending: Emergency Medicine | Admitting: Emergency Medicine

## 2015-04-11 ENCOUNTER — Emergency Department (HOSPITAL_COMMUNITY): Payer: BLUE CROSS/BLUE SHIELD

## 2015-04-11 ENCOUNTER — Encounter (HOSPITAL_COMMUNITY): Payer: Self-pay | Admitting: Emergency Medicine

## 2015-04-11 DIAGNOSIS — J36 Peritonsillar abscess: Secondary | ICD-10-CM | POA: Diagnosis not present

## 2015-04-11 DIAGNOSIS — J029 Acute pharyngitis, unspecified: Secondary | ICD-10-CM | POA: Diagnosis present

## 2015-04-11 DIAGNOSIS — G43909 Migraine, unspecified, not intractable, without status migrainosus: Secondary | ICD-10-CM | POA: Diagnosis not present

## 2015-04-11 LAB — BASIC METABOLIC PANEL
Anion gap: 7 (ref 5–15)
BUN: 7 mg/dL (ref 6–20)
CALCIUM: 8.5 mg/dL — AB (ref 8.9–10.3)
CO2: 22 mmol/L (ref 22–32)
CREATININE: 0.57 mg/dL (ref 0.44–1.00)
Chloride: 108 mmol/L (ref 101–111)
GFR calc Af Amer: >60 mL/min (ref >60)
GFR calc non Af Amer: >60 mL/min (ref >60)
Glucose, Bld: 97 mg/dL (ref 65–99)
Potassium: 3.4 mmol/L — ABNORMAL LOW (ref 3.5–5.1)
Sodium: 137 mmol/L (ref 135–145)

## 2015-04-11 LAB — CBC WITH DIFFERENTIAL/PLATELET
Basophils Absolute: 0 10*3/uL (ref 0.0–0.1)
Basophils Relative: 0 %
EOS ABS: 0 10*3/uL (ref 0.0–0.7)
EOS PCT: 0 %
HCT: 39.1 % (ref 36.0–46.0)
Hemoglobin: 13.1 g/dL (ref 12.0–15.0)
LYMPHS ABS: 0.8 10*3/uL (ref 0.7–4.0)
LYMPHS PCT: 5 %
MCH: 29 pg (ref 26.0–34.0)
MCHC: 33.5 g/dL (ref 30.0–36.0)
MCV: 86.5 fL (ref 78.0–100.0)
MONO ABS: 1.5 10*3/uL — AB (ref 0.1–1.0)
Monocytes Relative: 9 %
Neutro Abs: 13.2 10*3/uL — ABNORMAL HIGH (ref 1.7–7.7)
Neutrophils Relative %: 86 %
PLATELETS: 329 10*3/uL (ref 150–400)
RBC: 4.52 MIL/uL (ref 3.87–5.11)
RDW: 13.3 % (ref 11.5–15.5)
WBC: 15.4 10*3/uL — ABNORMAL HIGH (ref 4.0–10.5)

## 2015-04-11 LAB — RAPID STREP SCREEN (MED CTR MEBANE ONLY): STREPTOCOCCUS, GROUP A SCREEN (DIRECT): NEGATIVE

## 2015-04-11 MED ORDER — MORPHINE SULFATE (PF) 4 MG/ML IV SOLN
4.0000 mg | Freq: Once | INTRAVENOUS | Status: AC
  Administered 2015-04-11: 4 mg via INTRAVENOUS
  Filled 2015-04-11: qty 1

## 2015-04-11 MED ORDER — IOHEXOL 300 MG/ML  SOLN
75.0000 mL | Freq: Once | INTRAMUSCULAR | Status: AC | PRN
  Administered 2015-04-11: 75 mL via INTRAVENOUS

## 2015-04-11 MED ORDER — DEXAMETHASONE SODIUM PHOSPHATE 10 MG/ML IJ SOLN
10.0000 mg | Freq: Once | INTRAMUSCULAR | Status: AC
  Administered 2015-04-11: 10 mg via INTRAVENOUS
  Filled 2015-04-11: qty 1

## 2015-04-11 MED ORDER — SODIUM CHLORIDE 0.9 % IV BOLUS (SEPSIS)
1000.0000 mL | Freq: Once | INTRAVENOUS | Status: AC
  Administered 2015-04-11: 1000 mL via INTRAVENOUS

## 2015-04-11 MED ORDER — KETOROLAC TROMETHAMINE 30 MG/ML IJ SOLN
30.0000 mg | Freq: Once | INTRAMUSCULAR | Status: AC
Start: 2015-04-11 — End: 2015-04-11
  Administered 2015-04-11: 30 mg via INTRAVENOUS
  Filled 2015-04-11: qty 1

## 2015-04-11 MED ORDER — CLINDAMYCIN PHOSPHATE 900 MG/50ML IV SOLN
900.0000 mg | Freq: Once | INTRAVENOUS | Status: AC
Start: 2015-04-11 — End: 2015-04-12
  Administered 2015-04-11: 900 mg via INTRAVENOUS
  Filled 2015-04-11: qty 50

## 2015-04-11 MED ORDER — ONDANSETRON HCL 4 MG/2ML IJ SOLN
4.0000 mg | Freq: Once | INTRAMUSCULAR | Status: AC
  Administered 2015-04-11: 4 mg via INTRAVENOUS
  Filled 2015-04-11: qty 2

## 2015-04-11 NOTE — ED Notes (Signed)
Pt with headache and cannot swallow.  Pt is talking and alert.  VSS.  Updated patient on her status and waiting for available room

## 2015-04-11 NOTE — ED Notes (Signed)
Pt to CT

## 2015-04-11 NOTE — ED Notes (Signed)
Pt back from CT

## 2015-04-11 NOTE — ED Notes (Signed)
Pt sts sore throat and congestion; pt sts throat feels swollen and unable to swallow saliva at present; no distress noted

## 2015-04-11 NOTE — ED Provider Notes (Signed)
CSN: [id number]     Arrival date & time 04/11/15  1331 History   First MD Initiated Contact with Patient 04/11/15 1705     Chief Complaint  Patient presents with  . Sore Throat  . Cough     (Consider location/radiation/quality/duration/timing/severity/associated sxs/prior Treatment) HPI Comments: Patient presents with complaint of sore throat, difficulty talking and swallowing over the past 1 day. No fever, URI symptoms. She has had occasional cough. She states that her symptoms became worse while she was at work this morning. She has a pressure feeling that goes from her throat down into her neck. No history of diabetes or immunocompromise. No treatments prior to arrival. No preceding sore throat or illness. Onset of symptoms acute. Course is gradually worsening. Nothing makes symptoms better.  Patient is a 39 y.o. female presenting with pharyngitis and cough. The history is provided by the patient.  Sore Throat Associated symptoms include coughing, neck pain (anterior) and a sore throat. Pertinent negatives include no abdominal pain, chills, congestion, fatigue, fever, headaches, myalgias, nausea, rash or vomiting.  Cough Associated symptoms: sore throat   Associated symptoms: no chills, no ear pain, no fever, no headaches, no myalgias, no rash, no rhinorrhea and no wheezing     Past Medical History  Diagnosis Date  . Migraine   . Vertigo   . Near syncope   . SVT (supraventricular tachycardia) (HCC)     a. Seen on event monitor 01/2015.  . Cardiomyopathy (HCC)     a. Echo 01/2015: EF 45-50% with diffuse HK.  Marland Kitchen History of stress test     a. ETT 11/16:  normal  . Snoring 04/06/2015   Past Surgical History  Procedure Laterality Date  . Tubal ligation Bilateral   . Cystectomy      from ovaries  . Vaginal hysterectomy     Family History  Problem Relation Age of Onset  . Cancer Father   . Hypertension    . Lupus    . Diabetes    . Stroke Neg Hx   . Fainting Neg Hx   .  Migraines Neg Hx   . Seizures Neg Hx   . Heart disease Maternal Aunt     Patient does not know details   Social History  Substance Use Topics  . Smoking status: Never Smoker   . Smokeless tobacco: Never Used  . Alcohol Use: 0.0 oz/week    0 Standard drinks or equivalent per week     Comment: Rare - once or twice a month.   OB History    No data available     Review of Systems  Constitutional: Negative for fever, chills and fatigue.  HENT: Positive for sore throat and trouble swallowing. Negative for congestion, ear pain, rhinorrhea and sinus pressure.   Eyes: Negative for redness.  Respiratory: Positive for cough. Negative for wheezing.   Gastrointestinal: Negative for nausea, vomiting, abdominal pain and diarrhea.  Genitourinary: Negative for dysuria.  Musculoskeletal: Positive for neck pain (anterior). Negative for myalgias and neck stiffness.  Skin: Negative for rash.  Neurological: Negative for headaches.  Hematological: Negative for adenopathy.    Allergies  Sulfur and Ibuprofen  Home Medications   Prior to Admission medications   Medication Sig Start Date End Date Taking? Authorizing Provider  amitriptyline (ELAVIL) 25 MG tablet Take 1 tablet by mouth at bedtime for 1 week, then increase to 2 tablets by mouth at bedtime    Historical Provider, MD  metoprolol succinate (TOPROL XL) 25  MG 24 hr tablet Take 1 tablet (25 mg total) by mouth daily. 03/28/15   Pricilla Riffle, MD  SUMAtriptan (IMITREX) 50 MG tablet Take one tablet by mouth one time at the onset of a headache. May repeat in 2 hours if headache persists or recurs. Do not take more than 3 a week.    Historical Provider, MD   BP 122/74 mmHg  Pulse 92  Temp(Src) 99.5 F (37.5 C) (Oral)  Resp 18  SpO2 99%   Physical Exam  Constitutional: She appears well-developed and well-nourished. She appears distressed.  Patient is actively spitting secretions into a bag  HENT:  Head: Normocephalic and atraumatic.  Right  Ear: Tympanic membrane, external ear and ear canal normal.  Left Ear: Tympanic membrane, external ear and ear canal normal.  Nose: Nose normal. No mucosal edema or rhinorrhea.  Mouth/Throat: Uvula is midline and mucous membranes are normal. Mucous membranes are not dry. No oral lesions. No trismus in the jaw. No uvula swelling. Posterior oropharyngeal edema and posterior oropharyngeal erythema present. No oropharyngeal exudate or tonsillar abscesses.    Eyes: Conjunctivae are normal. Right eye exhibits no discharge. Left eye exhibits no discharge.  Neck: Normal range of motion. Neck supple.  Tenderness to the anterior left neck.  Cardiovascular: Normal rate, regular rhythm and normal heart sounds.   Pulmonary/Chest: Effort normal and breath sounds normal. No respiratory distress. She has no wheezes. She has no rales.  She is in no respiratory distress.  Abdominal: Soft. There is no tenderness.  Lymphadenopathy:    She has no cervical adenopathy.  Neurological: She is alert.  Skin: Skin is warm and dry.  Psychiatric: She has a normal mood and affect.  Nursing note and vitals reviewed.   ED Course  Procedures (including critical care time) Labs Review Labs Reviewed - No data to display  Imaging Review Ct Soft Tissue Neck W Contrast  04/11/2015  CLINICAL DATA:  Initial evaluation for acute sore throat with dysphagia for 1 day. EXAM: CT NECK WITH CONTRAST TECHNIQUE: Multidetector CT imaging of the neck was performed using the standard protocol following the bolus administration of intravenous contrast. CONTRAST:  75mL OMNIPAQUE IOHEXOL 300 MG/ML  SOLN COMPARISON:  None. FINDINGS: Visualized portions of the brain demonstrate no acute abnormality. Globes and orbits within normal limits. Visualized paranasal sinuses and mastoid air cells are clear. Middle ear cavities are clear. Salivary glands including the parotid glands and submandibular glands are normal. Oral cavity within normal limits. No  acute abnormality about the dentition. Right palatine tonsil is enlarged and edematous in appearance, consistent with acute tonsillitis. Mid there is a superimposed hypodense rim enhancing collection at the posterior/inferior aspect of the enlarged tonsil, consistent with peritonsillar abscess. This measures 5 x 11 x 13 mm (AP by transverse by craniocaudad). Mild inflammatory stranding with induration within the right parapharyngeal fat. Uvula is enlarged and edematous in appearance. Mucosal edema with inflammatory stranding extends inferiorly within the right hypopharynx/supraglottic larynx, and laterally towards the right submandibular space. No retropharyngeal collection or abscess. Epiglottis itself is mildly edematous in appearance, likely related to the adjacent abscess and inflammatory changes. Lingual tonsils are somewhat hypertrophied within the vallecula. Vallecular itself is otherwise clear. Piriform sinuses within normal limits. True vocal cords symmetric and normal. Subglottic airway clear. Thyroid gland normal. No pathologically enlarged cervical lymph nodes identified. No supraclavicular adenopathy. Visualized superior mediastinum within normal limits. Partially visualized lungs are clear without acute process. Normal intravascular enhancement seen throughout the neck. No  acute osseus abnormality. No worrisome lytic or blastic osseous lesions. IMPRESSION: Findings consistent with acute right tonsillitis/ pharyngitis with inflammatory stranding and mucosal edema throughout the right hypopharynx and supraglottic larynx. There is an associated 5 x 11 x 13 mm peritonsillar abscess at the posterior/ inferior aspect of the right palatine tonsil. Electronically Signed   By: Rise Mu M.D.   On: 04/11/2015 23:09   I have personally reviewed and evaluated these images and lab results as part of my medical decision-making.   EKG Interpretation None       7:09 PM Patient seen and examined.  Work-up initiated. Medications ordered. Given question of early abscess and patient discomfort, will rule out abscess or neck infection with CT. Temperature recheck now 99.18F.  Vital signs reviewed and are as follows: BP 122/74 mmHg  Pulse 92  Temp(Src) 99.5 F (37.5 C) (Oral)  Resp 18  SpO2 99%  Patient feels much better after treatment in emergency department. CT shows small peritonsillar abscess with adjacent inflammatory changes. Imaging discussed with Dr. Preston Fleeting.   I spoke with Dr. Lazarus Salines who advises follow-up in the office tomorrow. He requests patient be discharged home with pain medication and amoxicillin.  Patient updated and is in agreement with plan. She is to return with worsening symptoms, inability to swallow, difficulty breathing or shortness of breath. She verbalizes understanding and agrees with plan.  MDM   Final diagnoses:  Peritonsillar abscess   Patient with peritonsillar abscess. She is handling her secretions well after treatment in emergency department with antibiotics, pain medication, steroids, Toradol. Discharged home with ENT follow-up as above.    Renne Crigler, PA-C 04/12/15 [id number]  Dione Booze, MD 04/12/15 [phone]

## 2015-04-11 NOTE — ED Notes (Signed)
Lab tech called to inform RN that the patient's BMP blood work had "grossly hemolyzed" and a new specimen is needed.

## 2015-04-12 MED ORDER — AMOXICILLIN 400 MG/5ML PO SUSR: ORAL | Status: DC

## 2015-04-12 MED ORDER — HYDROCODONE-ACETAMINOPHEN 7.5-325 MG/15ML PO SOLN: 15.0000 mL | Freq: Four times a day (QID) | ORAL | Status: DC | PRN

## 2015-04-12 NOTE — ED Notes (Signed)
Pt A&OX4, ambulatory at d/c with steady gait, NAD 

## 2015-04-12 NOTE — Discharge Instructions (Signed)
Please read and follow all provided instructions.  Your diagnoses today include:  1. Peritonsillar abscess    Tests performed today include:  Strep test: was negative for strep throat  Blood counts - elevated infection fighting cells  CT scan - shows abscess (pus) around the tonsil  Vital signs. See below for your results today.   Medications prescribed:   Amoxicillin - antibiotic  You have been prescribed an antibiotic medicine: take the entire course of medicine even if you are feeling better. Stopping early can cause the antibiotic not to work.   Hydrocodone/acetaminophen - narcotic pain medication  DO NOT drive or perform any activities that require you to be awake and alert because this medicine can make you drowsy. BE VERY CAREFUL not to take multiple medicines containing Tylenol (also called acetaminophen). Doing so can lead to an overdose which can damage your liver and cause liver failure and possibly death.  Home care instructions:  Please read the educational materials provided and follow any instructions contained in this packet.  Follow-up instructions: Please follow-up with your primary care provider as needed for further evaluation of your symptoms.  Return instructions:   Please return to the Emergency Department if you experience worsening symptoms.   Return if you have worsening problems swallowing, your neck becomes swollen, you cannot swallow your saliva or your voice becomes muffled.   Return with high persistent fever, persistent vomiting, or if you have trouble breathing.   Please return if you have any other emergent concerns.  Additional Information:  Your vital signs today were: BP 101/73 mmHg   Pulse 87   Temp(Src) 99.5 F (37.5 C) (Oral)   Resp 16   SpO2 99% If your blood pressure (BP) was elevated above 135/85 this visit, please have this repeated by your doctor within one month. --------------

## 2015-04-14 LAB — CULTURE, GROUP A STREP

## 2015-04-19 ENCOUNTER — Ambulatory Visit (INDEPENDENT_AMBULATORY_CARE_PROVIDER_SITE_OTHER): Payer: BLUE CROSS/BLUE SHIELD | Admitting: Neurology

## 2015-04-19 ENCOUNTER — Encounter: Payer: Self-pay | Admitting: Neurology

## 2015-04-19 VITALS — BP 100/70 | HR 76 | Ht 62.0 in | Wt 147.0 lb

## 2015-04-19 DIAGNOSIS — R55 Syncope and collapse: Secondary | ICD-10-CM

## 2015-04-19 DIAGNOSIS — G43109 Migraine with aura, not intractable, without status migrainosus: Secondary | ICD-10-CM

## 2015-04-19 DIAGNOSIS — G4700 Insomnia, unspecified: Secondary | ICD-10-CM

## 2015-04-19 DIAGNOSIS — G43909 Migraine, unspecified, not intractable, without status migrainosus: Secondary | ICD-10-CM

## 2015-04-19 MED ORDER — MIRTAZAPINE 15 MG PO TABS: 15.0000 mg | ORAL_TABLET | Freq: Every day | ORAL | Status: DC

## 2015-04-19 NOTE — Progress Notes (Signed)
NEUROLOGY FOLLOW UP OFFICE NOTE  Tasha Ross [id number]  HISTORY OF PRESENT ILLNESS: I had the pleasure of seeing Tasha Ross in follow-up in the neurology clinic on 04/19/2015.  The patient was last seen 3 months ago for vertigo, headaches, and syncope. MRI brain unremarkable. She had an abnormal echo and was referred to Cardiology, who did a holter monitor with note of tachycardia with heart rates up to 180-190 bpm. It was felt that amitriptyline was the potential cause. She has since discontinued the medication, and has also been started on Toprol XL  daily after cardiac MRI done had shown findings consistent with non-compaction cardiomyopathy. She has felt tired on the Toprol, but reports that she is always tired due to poor sleep. She had a sleep study recently which was reported as normal. She reports that the headaches had resolved for a time, last headache was 2 weeks ago, however she has been again having them the past couple of days. She reports they are not as bad as before. She denies any syncopal episodes since her last visit. She has occasional floaters in her vision. She continues to have poor sleep, Trazodone in the past did not help. She reports amitriptyline made her have "crazy thoughts."  HPI: This is a 39 yo RH woman presenting with vertigo, headaches, and episodes of passing out that started in 2009 or 2010. She had seen neurologist Dr. Shane Crutch in 2012 while living in [place], reporting that she is having the exact same symptoms she had, they had gotten under control while she was seeing him, with no episodes for a year, until January 2016 as she was transitioning for her move to [facility] last March 2016. She reports migraines followed a couple of days later by vertigo. She would brace herself, trying to sit still, but would lose consciousness. She has fallen to the floor at work. Sometimes she can brace herself, other times there is no warning. Symptoms are brief, she  would wake up on the floor. She was admitted to [facility] last 10/22/14 for near syncope, she reported right-sided weakness with slurred speech, that improved during transport. Glucose noted to be 72. She had an MRI brain without contrast with no acute intracranial changes. MRA brain reported a 1.49mm bulbous appearance of the distal left PICA branches in the region of the inferior cerebellar vermis. It may represent a vascular loop, however a small aneurysm is not entirely excluded. 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 Records from her previous neurologist were reviewed. He was concerned that dizziness and vertigo were not physiological in nature. He had done an MRI/MRA brain reported as normal as well. TSH, RPR, CBC normal. B12 low normal at 278, she had received replacement treatment. For migraine prophylaxis, she was started on amitriptyline in 2012, then switched to Topamax in 2013. She reports migraines are retro-orbital, sometimes dull or sharp pain, with phonophobia, nausea. She recalls taking amitriptyline and Topamax, and states these were stopped because the episodes stopped. She recalls taking Imitrex, Fioricet, Maxalt which would help initially. She feels the amitriptyline and citalopram in the past helped put her in a calm mode. She has been having more frequent migraines occurring twice a week, lasting 3-4 hours. Ibuprofen does not help. The vertigo occurs a couple of days after a migraine, with spinning sensation lasting up to a week. She usually gets less than 6 hours of sleep, feeling "real agitated." She reports the last syncopal episode was  in June. When asked about stress, she does indicate that she has been working for [employer] since 2014, but had to move to a different location with her move in January.   PAST MEDICAL HISTORY: Past Medical History  Diagnosis Date  . Migraine   . Vertigo   . Near syncope   . SVT (supraventricular  tachycardia) (HCC)     a. Seen on event monitor 01/2015.  . Cardiomyopathy (HCC)     a. Echo 01/2015: EF 45-50% with diffuse HK.  Marland Kitchen History of stress test     a. ETT 11/16:  normal  . Snoring 04/06/2015    MEDICATIONS: Current Outpatient Prescriptions on File Prior to Visit  Medication Sig Dispense Refill  . amoxicillin (AMOXIL) 400 MG/5ML suspension Take  PO tid for 3 days then  PO tid for 7 days for a total of 10 days. 300 mL 0  . HYDROcodone-acetaminophen (HYCET) 7.5-325 mg/15 ml solution Take 15 mLs by mouth 4 (four) times daily as needed for moderate pain. 120 mL 0  . metoprolol succinate (TOPROL XL) 25 MG 24 hr tablet Take 1 tablet (25 mg total) by mouth daily. 30 tablet 6  . SUMAtriptan (IMITREX) 50 MG tablet Take one tablet by mouth one time at the onset of a headache. May repeat in 2 hours if headache persists or recurs. Do not take more than 3 a week.     No current facility-administered medications on file prior to visit.    ALLERGIES: Allergies  Allergen Reactions  . Ibuprofen Other (See Comments)    Per Neurologist request  . Amitriptyline Other (See Comments)    Causing headaches and head pains  . Sulfa Antibiotics Swelling    FAMILY HISTORY: Family History  Problem Relation Age of Onset  . Cancer Father   . Hypertension    . Lupus    . Diabetes    . Stroke Neg Hx   . Fainting Neg Hx   . Migraines Neg Hx   . Seizures Neg Hx   . Heart disease Maternal Aunt     Patient does not know details    SOCIAL HISTORY: Social History   Social History  . Marital Status: Single    Spouse Name: N/A  . Number of Children: 4  . Years of Education: N/A   Occupational History  . Not on file.   Social History Main Topics  . Smoking status: Never Smoker   . Smokeless tobacco: Never Used  . Alcohol Use: 0.0 oz/week    0 Standard drinks or equivalent per week     Comment: Rare - once or twice a month.  . Drug Use: No  . Sexual Activity: Not on file    Other Topics Concern  . Not on file   Social History Narrative   Lives with 3 of her sons.  Has 4 sons.  Works for [employer].  Education: some college.       REVIEW OF SYSTEMS: Constitutional: No fevers, chills, or sweats, no generalized fatigue, change in appetite Eyes: No visual changes, double vision, eye pain Ear, nose and throat: No hearing loss, ear pain, nasal congestion, sore throat Cardiovascular: No chest pain, palpitations Respiratory:  No shortness of breath at rest or with exertion, wheezes GastrointestinaI: No nausea, vomiting, diarrhea, abdominal pain, fecal incontinence Genitourinary:  No dysuria, urinary retention or frequency Musculoskeletal:  No neck pain, +back pain Integumentary: No rash, pruritus, skin lesions Neurological: as above Psychiatric: No depression, insomnia,  anxiety Endocrine: No palpitations, fatigue, diaphoresis, mood swings, change in appetite, change in weight, increased thirst Hematologic/Lymphatic:  No anemia, purpura, petechiae. Allergic/Immunologic: no itchy/runny eyes, nasal congestion, recent allergic reactions, rashes  PHYSICAL EXAM: Filed Vitals:   04/19/15 0828  BP: 100/70  Pulse: 76   General: No acute distress Head:  Normocephalic/atraumatic Neck: supple, no paraspinal tenderness, full range of motion Heart:  Regular rate and rhythm Lungs:  Clear to auscultation bilaterally Back: No paraspinal tenderness Skin/Extremities: No rash, no edema Neurological Exam: alert and oriented to person, place, and time. No aphasia or dysarthria. Fund of knowledge is appropriate.  Recent and remote memory are intact.  Attention and concentration are normal.    Able to name objects and repeat phrases. Cranial nerves: Pupils equal, round, reactive to light.  Fundoscopic exam unremarkable, no papilledema. Extraocular movements intact with no nystagmus. Visual fields full. Facial sensation intact. No facial asymmetry. Tongue, uvula, palate midline.   Motor: Bulk and tone normal, muscle strength 5/5 throughout with no pronator drift.  Sensation to light touch intact.  No extinction to double simultaneous stimulation.  Deep tendon reflexes 2+ throughout, toes downgoing.  Finger to nose testing intact.  Gait narrow-based and steady, able to tandem walk adequately.  Romberg negative.  IMPRESSION: This is a 39 yo RH woman with a history of headaches, dizziness, and syncope since 2009 or 2010, symptoms had quieted down, then recurred at the beginning of 2016. On review of records, her previous neurologist was concerned dizziness and vertigo were not physiologic in nature. She did not tolerate amitriptyline for headache prophylaxis, in addition, Cardiology felt this was causing SVT on holter monitor. She is now off amitriptyline and has been started by Cardiology on Toprol. This may help with headaches as well. She denies any recent syncopal episodes. Headaches have decreased in intensity and frequency. Sleep is her main issue, she will try Remeron 15mg  qhs to help with sleep, we may uptitrate as tolerated. Side effects were discussed. She will follow-up in 3 months and knows to call our office   Thank you for allowing me to participate in her care.  Please do not hesitate to call for any questions or concerns.  The duration of this appointment visit was 24 minutes of face-to-face time with the patient.  Greater than 50% of this time was spent in counseling, explanation of diagnosis, planning of further management, and coordination of care.   Patrcia Dolly, M.D.   CC: Dr. Clovis Riley, Dr. Tenny Craw

## 2015-04-19 NOTE — Patient Instructions (Addendum)
1. Start Remeron 15mg : Take 1 tablet at bedtime. Call after 2 weeks to update on how you are feeling, and if tolerating medication, we will plan to increase dose 2. Continue follow-up with Cardiology 3. Follow-up in 3 months

## 2015-04-20 ENCOUNTER — Ambulatory Visit: Payer: BLUE CROSS/BLUE SHIELD | Admitting: Internal Medicine

## 2015-04-26 ENCOUNTER — Ambulatory Visit: Payer: BLUE CROSS/BLUE SHIELD | Admitting: Neurology

## 2015-05-18 ENCOUNTER — Ambulatory Visit (INDEPENDENT_AMBULATORY_CARE_PROVIDER_SITE_OTHER): Payer: BLUE CROSS/BLUE SHIELD | Admitting: Internal Medicine

## 2015-05-18 ENCOUNTER — Encounter: Payer: Self-pay | Admitting: Internal Medicine

## 2015-05-18 VITALS — BP 112/78 | HR 83 | Ht 62.0 in | Wt 155.6 lb

## 2015-05-18 DIAGNOSIS — I255 Ischemic cardiomyopathy: Secondary | ICD-10-CM | POA: Diagnosis not present

## 2015-05-18 MED ORDER — METOPROLOL SUCCINATE ER 25 MG PO TB24: 12.5000 mg | ORAL_TABLET | Freq: Every day | ORAL | Status: DC

## 2015-05-18 NOTE — Progress Notes (Signed)
Cardiology Office Note   Date:  05/18/2015   ID:  Tasha Ross, DOB January 20, 1976, MRN [id number]  PCP:  Lupe Carney, MD  Cardiologist:   Dietrich Pates, MD   F?U of mild LV dysfunciton     History of Present Illness: Tasha Ross is a 40 y.o. 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  Near syncope with no arrhythmia associatied.    I saw her with D Dunn in Clinic back in [place] 2016 Cardiac MRI done  There were pronounced trabeculations consistent with noncompaction   She has ad episodes of sinus tach and syncope   I placed her on Toprol XL    Dizziness and syncope have  improved  Has not had since she can remember Has not had HA in a month  Active  Walks at work Tired since  Starting Toprol        Current Outpatient Prescriptions  Medication Sig Dispense Refill  . metoprolol succinate (TOPROL XL) 25 MG 24 hr tablet Take 1 tablet (25 mg total) by mouth daily. 30 tablet 6  . mirtazapine (REMERON) 15 MG tablet Take 1 tablet (15 mg total) by mouth at bedtime. 30 tablet 4  . SUMAtriptan (IMITREX) 50 MG tablet Take one tablet by mouth one time at the onset of a headache. May repeat in 2 hours if headache persists or recurs. Do not take more than 3 a week.     No current facility-administered medications for this visit.    Allergies:   Ibuprofen; Amitriptyline; and Sulfa antibiotics   Past Medical History  Diagnosis Date  . Migraine   . Vertigo   . Near syncope   . SVT (supraventricular tachycardia) (HCC)     a. Seen on event monitor 01/2015.  . Cardiomyopathy (HCC)     a. Echo 01/2015: EF 45-50% with diffuse HK.  Marland Kitchen History of stress test     a. ETT 11/16:  normal  . Snoring 04/06/2015    Past Surgical History  Procedure Laterality Date  . Tubal ligation Bilateral   . Cystectomy      from ovaries  . Vaginal hysterectomy       Social History:  The patient  reports that she has never smoked. She has never used  smokeless tobacco. She reports that she drinks alcohol. She reports that she does not use illicit drugs.   Family History:  The patient's family history includes Cancer in her father; Heart disease in her maternal aunt. There is no history of Stroke, Fainting, Migraines, or Seizures.    ROS:  Please see the history of present illness. All other systems are reviewed and  Negative to the above problem except as noted.    PHYSICAL EXAM: VS:  BP 112/78 mmHg  Pulse 83  Ht 5\' 2"  (1.575 m)  Wt 70.58 kg (155 lb 9.6 oz)  BMI 28.45 kg/m2  SpO2 99%  GEN: Well nourished, well developed, in no acute distress HEENT: normal Neck: no JVD, carotid bruits, or masses Cardiac: RRR; no murmurs, rubs, or gallops,no edema  Respiratory:  clear to auscultation bilaterally, normal work of breathing GI: soft, nontender, nondistended, + BS  No hepatomegaly  MS: no deformity Moving all extremities   Skin: warm and dry, no rash Neuro:  Strength and sensation are intact Psych: euthymic mood, full affect   EKG:  EKG is not ordered today.   Lipid Panel  Component Value Date/Time   CHOL 135 12/22/2008 2028   TRIG 35 12/22/2008 2028   HDL 57 12/22/2008 2028   CHOLHDL 2.4 Ratio 12/22/2008 2028   VLDL 7 12/22/2008 2028   LDLCALC 71 12/22/2008 2028      Wt Readings from Last 3 Encounters:  05/18/15 70.58 kg (155 lb 9.6 oz)  04/19/15 66.679 kg (147 lb)  04/05/15 66.679 kg (147 lb)      ASSESSMENT AND PLAN:  1  Hx mild LV dysfunciton   MRI consistent with mild LV noncompaction Feels more tired on toprol  I have asked her to cut in 1/2  (12.5 mg )  Would like to add ACE I but  May not be able to   Reviewed anatomy of heart with pt and rationale for recommendations   Plan for f/u later this spring  2  Dizziness  Pt denies     Signed, Dietrich Pates, MD  05/18/2015 10:49 AM    [facility] [address] [place], [place], [place]  [address] Phone: [phone]; Fax: [phone]

## 2015-05-18 NOTE — Patient Instructions (Signed)
Your physician has recommended you make the following change in your medication:  1.) decrease Toprol to 12.5 mg (1/2 tablet) daily  Your physician recommends that you schedule a follow-up appointment in: May 2017.   PLEASE CALL IN A FEW WEEKS TO GIVE AN UPDATE ON HOW YOU ARE FEELING AFTER DECREASING THE TOPROL.

## 2015-09-26 NOTE — Progress Notes (Signed)
Cardiology Office Note   Date:  09/28/2015   ID:  Edwina, Grossberg 11-16-1975, MRN [id number]  PCP:  Donnie Coffin, MD  Cardiologist:   Dorris Carnes, MD   F?U of SVT and dizziness   History of Present Illness: Tasha Ross is a 40 y.o. female with a history of SVT and dizziness  She was last seen by Kathleen Argue in November    Echo prior LVEF 45 to 50%  Event monitor SVT 180 to 190  Asymptomatic  Self limited.   When pt seen complained of CP and synope  HR on 175 at time   Seen a [facility]   Case reviewed with Olin Pia at [facility] "SVT" was actually Sinus tach.  Recomm salt, abdominal binder   MRI LVEF 55%  Pronounced trabeculation consistent with noncompaction.  Recomm CHF regimen   Since seen her breathing has been good  No CP   She had a flurry of palpitations about a month ago  If recurr would set up for antoher event monitor  "SVT" felt t obe ST in past     Outpatient Prescriptions Prior to Visit  Medication Sig Dispense Refill  . metoprolol succinate (TOPROL XL) 25 MG 24 hr tablet Take 0.5 tablets (12.5 mg total) by mouth daily. 30 tablet 6  . mirtazapine (REMERON) 15 MG tablet Take 1 tablet (15 mg total) by mouth at bedtime. 30 tablet 4  . SUMAtriptan (IMITREX) 50 MG tablet Take one tablet by mouth one time at the onset of a headache. May repeat in 2 hours if headache persists or recurs. Do not take more than 3 a week.     No facility-administered medications prior to visit.     Allergies:   Ibuprofen; Amitriptyline; Sulfa antibiotics; Sulfur; Clindamycin; and Sulfamethoxazole   Past Medical History  Diagnosis Date  . Migraine   . Vertigo   . Near syncope   . SVT (supraventricular tachycardia) ([place])     a. Seen on event monitor 01/2015.  . Cardiomyopathy ([place])     a. Echo 01/2015: EF 45-50% with diffuse HK.  Marland Kitchen History of stress test     a. ETT 11/16:  normal  . Snoring 04/06/2015    Past Surgical History  Procedure Laterality Date  . Tubal ligation  Bilateral   . Cystectomy      from ovaries  . Vaginal hysterectomy       Social History:  The patient  reports that she has never smoked. She has never used smokeless tobacco. She reports that she drinks alcohol. She reports that she does not use illicit drugs.   Family History:  The patient's family history includes Cancer in her father; Heart disease in her maternal aunt. There is no history of Stroke, Fainting, Migraines, or Seizures.    ROS:  Please see the history of present illness. All other systems are reviewed and  Negative to the above problem except as noted.    PHYSICAL EXAM: VS:  BP 108/80 mmHg  Pulse 86  Ht _0  (1.575 m)  Wt 161 lb 9.6 oz (73.301 kg)  BMI 29.55 kg/m2  GEN: Well nourished, well developed, in no acute distress HEENT: normal Neck: no JVD, carotid bruits, or masses Cardiac: RRR; no murmurs, rubs, or gallops,no edema  Respiratory:  clear to auscultation bilaterally, normal work of breathing GI: soft, nontender, nondistended, + BS  No hepatomegaly  MS: no deformity Moving all extremities   Skin:  warm and dry, no rash Neuro:  Strength and sensation are intact Psych: euthymic mood, full affect   EKG:  EKG is not ordered today.   Lipid Panel    Component Value Date/Time   CHOL 135 12/22/2008 2028   TRIG 35 12/22/2008 2028   HDL 57 12/22/2008 2028   CHOLHDL 2.4 Ratio 12/22/2008 2028   VLDL 7 12/22/2008 2028   LDLCALC 71 12/22/2008 2028      Wt Readings from Last 3 Encounters:  09/28/15 161 lb 9.6 oz (73.301 kg)  05/18/15 155 lb 9.6 oz (70.58 kg)  04/19/15 147 lb (66.679 kg)      ASSESSMENT AND PLAN:  1  Noncompaction  No evid of volume overload  LVEF 55% at MRI On coreg  Will get labs  Consider ACEI Discussed with pt  Sons should have echoes (4 sons 21,19,16 and 14)  2.  HCM  Will check lipdis     3  Palpitation  If recurs will set up for event mnitor       Signed, Dorris Carnes, MD  09/28/2015 11:54 AM    [address] [address], [place], [place]  [address] Phone: [phone]; Fax: [phone]

## 2015-09-28 ENCOUNTER — Ambulatory Visit (INDEPENDENT_AMBULATORY_CARE_PROVIDER_SITE_OTHER): Payer: BLUE CROSS/BLUE SHIELD | Admitting: Internal Medicine

## 2015-09-28 ENCOUNTER — Encounter: Payer: Self-pay | Admitting: Internal Medicine

## 2015-09-28 VITALS — BP 108/80 | HR 86 | Ht 62.0 in | Wt 161.6 lb

## 2015-09-28 DIAGNOSIS — I429 Cardiomyopathy, unspecified: Secondary | ICD-10-CM

## 2015-09-28 LAB — BASIC METABOLIC PANEL
BUN: 8 mg/dL (ref 7–25)
CHLORIDE: 106 mmol/L (ref 98–110)
CO2: 23 mmol/L (ref 20–31)
Calcium: 9 mg/dL (ref 8.6–10.2)
Creat: 0.65 mg/dL (ref 0.50–1.10)
Glucose, Bld: 77 mg/dL (ref 65–99)
POTASSIUM: 4.2 mmol/L (ref 3.5–5.3)
Sodium: 139 mmol/L (ref 135–146)

## 2015-09-28 LAB — LIPID PANEL
CHOLESTEROL: 147 mg/dL (ref 125–200)
HDL: 61 mg/dL (ref >=46)
LDL CALC: 76 mg/dL (ref <130)
TRIGLYCERIDES: 48 mg/dL (ref <150)
Total CHOL/HDL Ratio: 2.4 Ratio (ref <=5.0)
VLDL: 10 mg/dL (ref <30)

## 2015-09-28 NOTE — Patient Instructions (Signed)
Your physician recommends that you continue on your current medications as directed. Please refer to the Current Medication list given to you today. Your physician recommends that you return for lab work in: today (BMET, LIPIDS)  If the palpitations return, call and let Dr. Charlott Rakes nurse know.  We will schedule you to wear an cardiac event monitor.

## 2015-11-05 ENCOUNTER — Emergency Department (HOSPITAL_COMMUNITY): Payer: BLUE CROSS/BLUE SHIELD

## 2015-11-05 ENCOUNTER — Encounter (HOSPITAL_COMMUNITY): Payer: Self-pay

## 2015-11-05 ENCOUNTER — Telehealth: Payer: Self-pay | Admitting: Internal Medicine

## 2015-11-05 ENCOUNTER — Emergency Department (HOSPITAL_COMMUNITY)
Admission: EM | Admit: 2015-11-05 | Discharge: 2015-11-05 | Disposition: A | Discharge status: Home / Self Care | Payer: BLUE CROSS/BLUE SHIELD | Attending: Emergency Medicine | Admitting: Emergency Medicine

## 2015-11-05 DIAGNOSIS — Z79899 Other long term (current) drug therapy: Secondary | ICD-10-CM | POA: Insufficient documentation

## 2015-11-05 DIAGNOSIS — R002 Palpitations: Secondary | ICD-10-CM | POA: Diagnosis not present

## 2015-11-05 DIAGNOSIS — R072 Precordial pain: Secondary | ICD-10-CM | POA: Diagnosis present

## 2015-11-05 DIAGNOSIS — R55 Syncope and collapse: Secondary | ICD-10-CM | POA: Diagnosis not present

## 2015-11-05 LAB — BASIC METABOLIC PANEL
ANION GAP: 5 (ref 5–15)
BUN: 8 mg/dL (ref 6–20)
CALCIUM: 9 mg/dL (ref 8.9–10.3)
CO2: 26 mmol/L (ref 22–32)
CREATININE: 0.73 mg/dL (ref 0.44–1.00)
Chloride: 107 mmol/L (ref 101–111)
Glucose, Bld: 82 mg/dL (ref 65–99)
Potassium: 3.8 mmol/L (ref 3.5–5.1)
SODIUM: 138 mmol/L (ref 135–145)

## 2015-11-05 LAB — CBC
HCT: 39.4 % (ref 36.0–46.0)
HEMOGLOBIN: 13 g/dL (ref 12.0–15.0)
MCH: 28.7 pg (ref 26.0–34.0)
MCHC: 33 g/dL (ref 30.0–36.0)
MCV: 87 fL (ref 78.0–100.0)
PLATELETS: 353 10*3/uL (ref 150–400)
RBC: 4.53 MIL/uL (ref 3.87–5.11)
RDW: 12.6 % (ref 11.5–15.5)
WBC: 6.2 10*3/uL (ref 4.0–10.5)

## 2015-11-05 LAB — I-STAT TROPONIN, ED
TROPONIN I, POC: 0 ng/mL (ref 0.00–0.08)
Troponin i, poc: 0 ng/mL (ref 0.00–0.08)

## 2015-11-05 NOTE — ED Provider Notes (Signed)
I saw and evaluated the patient, reviewed the resident's note and I agree with the findings and plan.   EKG Interpretation   Date/Time:  Monday November 05 2015 13:21:26 EDT Ventricular Rate:  99 PR Interval:  140 QRS Duration: 76 QT Interval:  340 QTC Calculation: 436 R Axis:   63 Text Interpretation:  Normal sinus rhythm Nonspecific T wave abnormality ,  new since prior tracing Abnormal ECG Confirmed by Maddelynn Moosman  MD-J, Mihira Tozzi ([id number])  on 11/05/2015 4:47:28 PM      Pt presented to the ED with syncope and chest discomfort.  She has a history of similar episodes in the past.  Patient has a normal cardiac rhythm here in the emergency room. She started having chest pain or shortness of breath. Overall low risk for major adverse cardiac event. Plan on a  second troponin and assuming that is negative outpatient follow-up with her cardiologist.   Linwood Dibbles, MD 11/05/15 Rickey Primus

## 2015-11-05 NOTE — ED Notes (Signed)
Patient complains of palpitations for the past 2 days and this am developed a chest tightness and had a syncopal episode at work, was caught by co-worker and denies injury. Weakness on arrival, denies CP

## 2015-11-05 NOTE — Telephone Encounter (Signed)
New message       Pt c/o Syncope: STAT if syncope occurred within 30 minutes and pt complains of lightheadedness High Priority if episode of passing out, completely, today or in last 24 hours   Did you pass out today? yes When is the last time you passed out? 30 minutes ago Has this occurred multiple times? yes 1. Did you have any symptoms prior to passing out? Prior to passing out, pt felt like she was in a daze

## 2015-11-05 NOTE — Telephone Encounter (Signed)
Received a call from patient.She stated she just passed out at work.Stated her son brought her home.She feels very weak and feels like she is in a daze at present.Advised she needs to go to ER.

## 2015-11-05 NOTE — ED Provider Notes (Signed)
CSN: [id number]     Arrival date & time 11/05/15  1317 History   First MD Initiated Contact with Patient 11/05/15 1617     Chief Complaint  Patient presents with  . Chest Pain     (Consider location/radiation/quality/duration/timing/severity/associated sxs/prior Treatment) Patient is a 40 y.o. female presenting with chest pain and syncope. The history is provided by the patient.  Chest Pain Pain location:  Substernal area Pain quality: pressure and tightness   Pain radiates to:  Does not radiate Pain radiates to the back: no   Pain severity:  Moderate Onset quality:  Gradual Duration:  30 minutes Timing:  Constant Progression:  Partially resolved Chronicity:  Recurrent Context: at rest   Relieved by:  Nothing Associated symptoms: dizziness ("room spinning"), fatigue, shortness of breath, syncope and weakness   Associated symptoms: no abdominal pain, no altered mental status, no anxiety, no back pain, no diaphoresis, no fever, no headache, no palpitations and not vomiting (lower extremities feel weak/heavy)   Loss of Consciousness Episode history:  Single Most recent episode:  Today Timing:  Intermittent Progression:  Resolved Chronicity:  Recurrent Context: normal activity and standing up   Context: not bowel movement, not dehydration and not exertion   Witnessed: yes   Relieved by:  Lying down Worsened by:  Posture (trying to stand back up) Ineffective treatments:  Sitting up and activity Associated symptoms: chest pain, difficulty breathing, dizziness ("room spinning"), shortness of breath and weakness   Associated symptoms: no anxiety, no confusion, no diaphoresis, no fever, no focal weakness, no headaches, no palpitations, no recent surgery, no seizures, no visual change and no vomiting (lower extremities feel weak/heavy)     Past Medical History  Diagnosis Date  . Migraine   . Vertigo   . Near syncope   . SVT (supraventricular tachycardia) (HCC)     a. Seen on  event monitor 01/2015.  . Cardiomyopathy (HCC)     a. Echo 01/2015: EF 45-50% with diffuse HK.  Marland Kitchen History of stress test     a. ETT 11/16:  normal  . Snoring 04/06/2015   Past Surgical History  Procedure Laterality Date  . Tubal ligation Bilateral   . Cystectomy      from ovaries  . Vaginal hysterectomy     Family History  Problem Relation Age of Onset  . Cancer Father   . Hypertension    . Lupus    . Diabetes    . Stroke Neg Hx   . Fainting Neg Hx   . Migraines Neg Hx   . Seizures Neg Hx   . Heart disease Maternal Aunt     Patient does not know details   Social History  Substance Use Topics  . Smoking status: Never Smoker   . Smokeless tobacco: Never Used  . Alcohol Use: 0.0 oz/week    0 Standard drinks or equivalent per week     Comment: Rare - once or twice a month.   OB History    No data available     Review of Systems  Constitutional: Positive for fatigue. Negative for fever and diaphoresis.  HENT: Positive for congestion (chronic), rhinorrhea and sinus pressure. Negative for hearing loss and tinnitus.        Headache  Eyes: Negative for visual disturbance.       Left eye "floaters" that waxe and wane  Respiratory: Positive for chest tightness and shortness of breath.   Cardiovascular: Positive for chest pain and syncope. Negative for palpitations.  Gastrointestinal: Negative for vomiting (lower extremities feel weak/heavy) and abdominal pain.  Genitourinary: Negative for dysuria and flank pain.  Musculoskeletal: Negative for back pain.  Skin: Negative for pallor, rash and wound.  Neurological: Positive for dizziness ("room spinning") and weakness. Negative for focal weakness, seizures and headaches.  Psychiatric/Behavioral: Negative for confusion.      Allergies  Ibuprofen; Amitriptyline; Sulfa antibiotics; Sulfur; Clindamycin; and Sulfamethoxazole  Home Medications   Prior to Admission medications   Medication Sig Start Date End Date Taking?  Authorizing Provider  metoprolol succinate (TOPROL XL) 25 MG 24 hr tablet Take 0.5 tablets (12.5 mg total) by mouth daily. Patient taking differently: Take 12.5 mg by mouth at bedtime.  05/18/15  Yes Pricilla Riffle, MD  mirtazapine (REMERON) 15 MG tablet Take 1 tablet (15 mg total) by mouth at bedtime. 04/19/15  Yes Van Clines, MD  Multiple Vitamins-Minerals (ADULT GUMMY) CHEW Chew 2 tablets by mouth daily.   Yes Historical Provider, MD  SUMAtriptan (IMITREX) 50 MG tablet Take 50 mg by mouth every 2 (two) hours as needed for migraine. Take one tablet by mouth one time at the onset of a headache. May repeat in 2 hours if headache persists or recurs. Do not take more than 3 a week.   Yes Historical Provider, MD   BP 111/82 mmHg  Pulse 64  Temp(Src) 98.3 F (36.8 C) (Oral)  Resp 20  Ht 5\' 2"  (1.575 m)  Wt 72.576 kg  BMI 29.26 kg/m2  SpO2 98% Physical Exam  Constitutional: She is oriented to person, place, and time. She appears well-developed and well-nourished. No distress.  HENT:  Head: Normocephalic and atraumatic.  Eyes: Conjunctivae are normal.  Cardiovascular: Normal rate and normal heart sounds.   No murmur heard. Pulmonary/Chest: Effort normal and breath sounds normal. No respiratory distress. She has no wheezes.  Abdominal: Soft. There is no tenderness.  Musculoskeletal: She exhibits no edema.  Neurological: She is alert and oriented to person, place, and time. No cranial nerve deficit.  Able to ambulate without difficulty. Normal finger to nose and heel to shin bilaterally. Normal strength in all extremities. No vertigo induced by turning head quickly. Equivocal romberg.   Skin: Skin is warm. No rash noted. She is not diaphoretic. No pallor.  Psychiatric: She has a normal mood and affect. Her behavior is normal.  Nursing note and vitals reviewed.   ED Course  Procedures (including critical care time) Labs Review Labs Reviewed  BASIC METABOLIC PANEL  CBC  I-STAT  TROPOININ, ED  Rosezena Sensor, ED    Imaging Review Dg Chest 2 View  11/05/2015  CLINICAL DATA:  Palpitations. EXAM: CHEST  2 VIEW COMPARISON:  09/14/2007. FINDINGS: Mediastinum hilar structures are normal. Lungs are clear. No pleural effusion or pneumothorax. Heart size normal. No acute bony abnormality. IMPRESSION: No acute cardiopulmonary disease. Electronically Signed   By: Maisie Fus  Register   On: 11/05/2015 13:53   I have personally reviewed and evaluated these images and lab results as part of my medical decision-making.   EKG Interpretation   Date/Time:  Monday November 05 2015 13:21:26 EDT Ventricular Rate:  99 PR Interval:  140 QRS Duration: 76 QT Interval:  340 QTC Calculation: 436 R Axis:   63 Text Interpretation:  Normal sinus rhythm Nonspecific T wave abnormality ,  new since prior tracing Abnormal ECG Confirmed by KNAPP  MD-J, JON ([id number])  on 11/05/2015 4:47:28 PM      MDM   Final diagnoses:  Syncope and  collapse    PEBBLE BOTKIN presents for syncopal episode that lasted a few seconds while walking at work. She has features suggestive of cardiovascular/vagal etiology including prodromal light-headedness and vision darkening and resolution from lying flat. She's had workup for syncope before and was found to have cardiomyopathy of unknown etiology with EF 45%. EKG shows normal sinus rhythm. Delta troponin undetectable. BMP and CBC unremarkable. Asymptomatic throughout 4 hours observation in the ED. Patient referred to her cardiologist for close follow up.   Patient seen with Dr. Lynelle Doctor.   Levora Angel, MD 11/06/15 [phone]

## 2015-11-05 NOTE — Discharge Instructions (Signed)

## 2015-11-15 ENCOUNTER — Telehealth: Payer: Self-pay | Admitting: Internal Medicine

## 2015-11-15 NOTE — Telephone Encounter (Signed)
New message:   Please call,she says she is still having issues with heart racing.

## 2015-11-15 NOTE — Telephone Encounter (Signed)
[name].  Like she doesn't get enough air with normal breathing. Knows her heart is racing, does not feel palpitations at this time. Feels like she did before she passed out last week.  Denies CP.  Keeps water with her all the time, drinking.  Using salt liberally.  Did not purchase an abdominal binder.  Per OV note from Dr. Harrington Challenger in May, 2017:  History of Present Illness: Tasha Ross is a 40 y.o. female with a history of SVT and dizziness She was last seen by Kathleen Argue in November  Echo prior LVEF 45 to 50% Event monitor SVT 180 to 190 Asymptomatic Self limited.  When pt seen complained of CP and synope HR on 175 at time Seen a [facility] ED  Case reviewed with Olin Pia at [facility] "SVT" was actually Sinus tach. Recomm salt, abdominal binder  MRI LVEF 55% Pronounced trabeculation consistent with noncompaction. Recomm CHF regimen  Since seen her breathing has been good No CP  She had a flurry of palpitations about a month ago If recurr would set up for antoher event monitor "SVT" felt t obe ST in past    Pt aware I am forwarding to Dr. Harrington Challenger for review/recommendations; I have scheduled an appointment with APP NEXT Friday November 23, 2015.

## 2015-11-19 NOTE — Telephone Encounter (Signed)
Agree, When she comes in to clinic I would recomm orthostatics

## 2015-11-23 ENCOUNTER — Encounter: Payer: Self-pay | Admitting: Nurse Practitioner

## 2015-11-23 ENCOUNTER — Encounter (INDEPENDENT_AMBULATORY_CARE_PROVIDER_SITE_OTHER): Payer: Self-pay

## 2015-11-23 ENCOUNTER — Ambulatory Visit (INDEPENDENT_AMBULATORY_CARE_PROVIDER_SITE_OTHER): Payer: BLUE CROSS/BLUE SHIELD | Admitting: Nurse Practitioner

## 2015-11-23 VITALS — BP 108/77 | HR 77 | Ht 62.0 in | Wt 163.0 lb

## 2015-11-23 DIAGNOSIS — R55 Syncope and collapse: Secondary | ICD-10-CM | POA: Diagnosis not present

## 2015-11-23 DIAGNOSIS — R002 Palpitations: Secondary | ICD-10-CM

## 2015-11-23 DIAGNOSIS — I428 Other cardiomyopathies: Secondary | ICD-10-CM

## 2015-11-23 DIAGNOSIS — I429 Cardiomyopathy, unspecified: Secondary | ICD-10-CM | POA: Diagnosis not present

## 2015-11-23 NOTE — Patient Instructions (Addendum)
We will be checking the following labs today - BMET, BNP, CBC and TSH   Medication Instructions:    Continue with your current medicines for now.     Testing/Procedures To Be Arranged:  Event monitor  Follow-Up:   See Dr. Tenny Craw as planned.    Other Special Instructions:   Try to cut back some on your salt use   Abdominal binder  Knee high support stockings  No driving    If you need a refill on your cardiac medications before your next appointment, please call your pharmacy.   Call the [facility] office at [phone] if you have any questions, problems or concerns.

## 2015-11-23 NOTE — Progress Notes (Signed)
CARDIOLOGY OFFICE NOTE  Date:  11/23/2015    [name] Date of Birth: 08/05/1975 Medical Record #[id number]  PCP:  Lupe Carney, MD  Cardiologist:  Tenny Craw  Chief Complaint  Patient presents with  . Irregular Heart Beat    Work in visit - seen for Dr. Tenny Craw    History of Present Illness: RAILEE KESECKER is a 40 y.o. female who presents today for a work in visit. Seen for Dr. Tenny Craw.   She has a history of SVT and dizziness. She has had an Echo with prior LVEF 45 to 50% Event monitor SVT 180 to 190 - asymptomatic Case reviewed with Odessa Fleming at who felt that her "SVT" was actually Sinus tach. Recommended to liberalize salt, wear abdominal binder.  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 She has had a normal GXT back in November of 2016.   She was last seen in May and felt to be doing ok. Had had a "flurry" of palpitations but resolved at visit. Dr. Tenny Craw suggested an event monitor if recurred.   Phone call last week - [name]. Like she doesn't get enough air with normal breathing. Knows her heart is racing, does not feel palpitations at this time. Feels like she did before she passed out last week. Denies CP. Keeps water with her all the time, drinking. Using salt liberally. Did not purchase an abdominal binder.  Thus added to my schedule.   Comes in today. Here alone. Her history is a little hard for me to follow. Has had "an episode" a few weeks ago that "is still going on". She is short of breath. She is dizzy. She says she cannot lie flat because she is so short of breath. No regular exercise but does stand all day with her job. Has started having recurrent syncope again - 2 spells just this week. Says her HR is fast sometimes. She wonders if she should be on higher dose of her Toprol which was cut back "many months ago". Using "lots" of salt. No  real swelling. Does not check her BP at home.   Past Medical History  Diagnosis Date  . Migraine   . Vertigo   . Near syncope   . SVT (supraventricular tachycardia) (HCC)     a. Seen on event monitor 01/2015.  . Cardiomyopathy (HCC)     a. Echo 01/2015: EF 45-50% with diffuse HK.  Marland Kitchen History of stress test     a. ETT 11/16:  normal  . Snoring 04/06/2015    Past Surgical History  Procedure Laterality Date  . Tubal ligation Bilateral   . Cystectomy      from ovaries  . Vaginal hysterectomy       Medications: Current Outpatient Prescriptions  Medication Sig Dispense Refill  . metoprolol succinate (TOPROL-XL) 25 MG 24 hr tablet Take 12.5 mg by mouth daily.    . mirtazapine (REMERON) 15 MG tablet Take 1 tablet (15 mg total) by mouth at bedtime. 30 tablet 4  . Multiple Vitamins-Minerals (ADULT GUMMY) CHEW Chew 2 tablets by mouth daily.    . SUMAtriptan (IMITREX) 50 MG tablet Take 50 mg by mouth every 2 (two) hours as needed for migraine. Take one tablet by mouth one time at the onset of a headache. May repeat in 2 hours if headache persists or recurs. Do not take more than 3 a  week.     No current facility-administered medications for this visit.    Allergies: Allergies  Allergen Reactions  . Ibuprofen Other (See Comments)    Per Neurologist request  . Amitriptyline Other (See Comments)    Causing headaches and head pains  . Sulfa Antibiotics Swelling  . Sulfur Swelling  . Clindamycin Rash  . Sulfamethoxazole Swelling    Social History: The patient  reports that she has never smoked. She has never used smokeless tobacco. She reports that she drinks alcohol. She reports that she does not use illicit drugs.   Family History: The patient's family history includes Cancer in her father; Heart disease in her maternal aunt. There is no history of Stroke, Fainting, Migraines, or Seizures.   Review of Systems: Please see the history of present illness.   Otherwise, the review of  systems is positive for none.   All other systems are reviewed and negative.   Physical Exam: VS:  BP 108/77 mmHg  Pulse 77  Ht  (1.575 m)  Wt 163 lb (73.936 kg)  BMI 29.81 kg/m2 .  BMI Body mass index is 29.81 kg/(m^2).  Wt Readings from Last 3 Encounters:  11/23/15 163 lb (73.936 kg)  11/05/15 160 lb (72.576 kg)  09/28/15 161 lb 9.6 oz (73.301 kg)    Lying BP is 108/77 with HR 72 Sitting BP is 109/78 with HR 71 Standing 0 min BP is 123/80 with HR 77 (dizziness reported) Standing 3 min BP is 124/83 with HR 84 (no symptoms)  General: Pleasant. Well developed, well nourished and in no acute distress. She has gained 2 pounds.  HEENT: Normal. Neck: Supple, no JVD, carotid bruits, or masses noted.  Cardiac: Regular rate and rhythm. No murmurs, rubs, or gallops. No edema.  Respiratory:  Lungs are clear to auscultation bilaterally with normal work of breathing.  GI: Soft and nontender.  MS: No deformity or atrophy. Gait and ROM intact. Skin: Warm and dry. Color is normal.  Neuro:  Strength and sensation are intact and no gross focal deficits noted.  Psych: Alert, appropriate and with normal affect.   LABORATORY DATA:  EKG:  EKG is ordered today. This demonstrates NSR.  Lab Results  Component Value Date   WBC 6.2 11/05/2015   HGB 13.0 11/05/2015   HCT 39.4 11/05/2015   PLT 353 11/05/2015   GLUCOSE 82 11/05/2015   CHOL 147 09/28/2015   TRIG 48 09/28/2015   HDL 61 09/28/2015   LDLCALC 76 09/28/2015   ALT 11* 01/25/2015   AST 17 01/25/2015   NA 138 11/05/2015   K 3.8 11/05/2015   CL 107 11/05/2015   CREATININE 0.73 11/05/2015   BUN 8 11/05/2015   CO2 26 11/05/2015   TSH 1.233 03/09/2015   INR 1.1 09/14/2007    BNP (last 3 results) No results for input(s): BNP in the last 8760 hours.  ProBNP (last 3 results) No results for input(s): PROBNP in the last 8760 hours.   Other Studies Reviewed Today:  Echo Study Conclusions from 01/2015  - Left ventricle: The  cavity size was mildly dilated. Systolic  function was mildly reduced. The estimated ejection fraction was  in the range of 45% to 50%. Diffuse hypokinesis. - Atrial septum: No defect or patent foramen ovale was identified.  Cardiac MRI FINDINGS from 03/2015: 1. Mild left ventricular dilatation with normal wall thickness and normal systolic function (LVEF = 55%).  There are no regional wall motion abnormalities.  There are pronounced  trabeculations in the apical and mid segments with compacted to non-compacted ratio > 2.3:1 when measured in long axis views.  LVEDD: 55 mm  LVESD: 36 mm  LVEDV: 131 ml  LVESV: 59 ml  SV: 73 ml  CO: 5.8 L/minute  2. Normal right ventricular size, thickness and systolic function (LVEF = 53%) with no regional wall motion abnormalities.  3. Normal biatrial size.  4. Normal size of the aortic root, ascending aorta and pulmonary artery.  5. Mild mitral and trivial tricuspid regurgitation.  6. No late gadolinium enhancement in the LV and RV myocardium.  7. No pericardial effusion.  IMPRESSION: 1. Mild left ventricular dilatation with normal wall thickness and normal systolic function (LVEF = 55%) with no regional wall motion abnormalities.  There are pronounced trabeculations in the apical and mid segments with compacted to non-compacted ratio > 2.3:1 when measured in long axis views.  2. Normal right ventricular size, thickness and systolic function (LVEF = 53%) with no regional wall motion abnormalities.  3. Mild mitral and trivial tricuspid regurgitation.  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 A standard CHF therapy is recommended.  Tobias Alexander  Electronically Signed  By: Tobias Alexander  On: 03/09/2015 18:19   Assessment/Plan: 1. Multitude of somatic complaints  2. Non-compaction cardiomyopathy  (form of NICM) - to be managed like CHF  3. Sinus tach on prior monitor  4. Syncope - she typically liberalizes her salt use  Very difficult situation and I think this is beyond my scope. Hard to manage as if she has heart failure when BP low and she liberalizes salt. We will place an event monitor. Not clear if she has ever had event monitor in place with actual syncopal spell captured. May need to consider Loop???? She has been told she should not drive. Labs today. I think she probably needs to cut back some on her sodium use. Would recommend the abdominal binder and also knee high support stockings. She is not orthostatic here today. Will ask Dr. Tenny Craw to review and get her input.   Current medicines are reviewed with the patient today.  The patient does not have concerns regarding medicines other than what has been noted above.  The following changes have been made:  See above.  Labs/ tests ordered today include:    Orders Placed This Encounter  Procedures  . Basic metabolic panel  . Brain natriuretic peptide  . CBC  . TSH  . Cardiac event monitor  . EKG 12-Lead     Disposition:   Further disposition to follow.    Patient is agreeable to this plan and will call if any problems develop in the interim.   Signed: Rosalio Macadamia, RN, ANP-C 11/23/2015 12:13 PM  [facility] [address] [place], [place]  [address] Phone: [phone] Fax: [phone]

## 2015-11-26 ENCOUNTER — Ambulatory Visit (INDEPENDENT_AMBULATORY_CARE_PROVIDER_SITE_OTHER): Payer: BLUE CROSS/BLUE SHIELD

## 2015-11-26 DIAGNOSIS — I429 Cardiomyopathy, unspecified: Secondary | ICD-10-CM | POA: Diagnosis not present

## 2015-11-26 DIAGNOSIS — R55 Syncope and collapse: Secondary | ICD-10-CM | POA: Diagnosis not present

## 2015-11-26 DIAGNOSIS — R002 Palpitations: Secondary | ICD-10-CM

## 2015-11-26 LAB — BASIC METABOLIC PANEL
BUN: 9 mg/dL (ref 7–25)
CO2: 25 mmol/L (ref 20–31)
Calcium: 8.9 mg/dL (ref 8.6–10.2)
Chloride: 106 mmol/L (ref 98–110)
Creat: 0.63 mg/dL (ref 0.50–1.10)
Glucose, Bld: 85 mg/dL (ref 65–99)
Potassium: 4.4 mmol/L (ref 3.5–5.3)
Sodium: 137 mmol/L (ref 135–146)

## 2015-11-26 LAB — CBC
HCT: 36.8 % (ref 35.0–45.0)
Hemoglobin: 12.4 g/dL (ref 11.7–15.5)
MCH: 28.6 pg (ref 27.0–33.0)
MCHC: 33.7 g/dL (ref 32.0–36.0)
MCV: 85 fL (ref 80.0–100.0)
MPV: 8.9 fL (ref 7.5–12.5)
Platelets: 396 10*3/uL (ref 140–400)
RBC: 4.33 MIL/uL (ref 3.80–5.10)
RDW: 13 % (ref 11.0–15.0)
WBC: 5.8 10*3/uL (ref 3.8–10.8)

## 2015-11-26 LAB — BRAIN NATRIURETIC PEPTIDE: Brain Natriuretic Peptide: 26.7 pg/mL (ref <100)

## 2015-11-26 LAB — TSH: TSH: 1.18 mIU/L

## 2015-12-18 ENCOUNTER — Telehealth: Payer: Self-pay | Admitting: *Deleted

## 2015-12-18 DIAGNOSIS — R350 Frequency of micturition: Secondary | ICD-10-CM | POA: Diagnosis not present

## 2015-12-18 DIAGNOSIS — N760 Acute vaginitis: Secondary | ICD-10-CM | POA: Diagnosis not present

## 2015-12-18 DIAGNOSIS — R42 Dizziness and giddiness: Secondary | ICD-10-CM

## 2015-12-18 DIAGNOSIS — N898 Other specified noninflammatory disorders of vagina: Secondary | ICD-10-CM | POA: Diagnosis not present

## 2015-12-18 DIAGNOSIS — R319 Hematuria, unspecified: Secondary | ICD-10-CM | POA: Diagnosis not present

## 2015-12-18 DIAGNOSIS — I429 Cardiomyopathy, unspecified: Secondary | ICD-10-CM | POA: Insufficient documentation

## 2015-12-18 DIAGNOSIS — B9689 Other specified bacterial agents as the cause of diseases classified elsewhere: Secondary | ICD-10-CM | POA: Diagnosis not present

## 2015-12-18 DIAGNOSIS — R0602 Shortness of breath: Secondary | ICD-10-CM

## 2015-12-18 NOTE — Telephone Encounter (Signed)
-----   Message from Pricilla Riffle, MD sent at 12/01/2015 11:55 PM EDT ----- I would set her up for a cardiopulmonary stress test

## 2015-12-18 NOTE — Telephone Encounter (Signed)
Received message from Dr. Tenny Craw that patient should have cardiopulmonary stress test. Called patient to inform.  Instructions provided and copy placed in mail.  She is aware someone will call her to schedule this test and is appreciative for the call.

## 2015-12-28 ENCOUNTER — Ambulatory Visit (HOSPITAL_COMMUNITY): Payer: BLUE CROSS/BLUE SHIELD | Attending: Internal Medicine

## 2015-12-28 DIAGNOSIS — R42 Dizziness and giddiness: Secondary | ICD-10-CM | POA: Diagnosis not present

## 2015-12-28 DIAGNOSIS — R0602 Shortness of breath: Secondary | ICD-10-CM

## 2016-01-22 ENCOUNTER — Encounter: Payer: Self-pay | Admitting: Internal Medicine

## 2016-02-03 NOTE — Progress Notes (Signed)
Cardiology Office Note   Date:  02/04/2016   ID:  Tasha Ross, DOB 03-16-1976, MRN [id number]  PCP:  Tasha Carney, MD  Cardiologist:   Dietrich Pates, MD   F/U of noncompaction     History of Present Illness: Tasha Ross is a 40 y.o. 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 metoprolol succinate (TOPROL-XL) 25 MG 24 hr tablet Take 12.5 mg by mouth daily.    . mirtazapine (REMERON) 15 MG tablet Take 1 tablet (15 mg total) by mouth at bedtime. 30 tablet 4  . Multiple Vitamins-Minerals (ADULT GUMMY) CHEW Chew 2 tablets by mouth daily.    . SUMAtriptan (IMITREX) 50 MG tablet Take 50 mg by mouth every 2 (two) hours as needed for migraine. Take one tablet by mouth one time at the onset of a headache. May repeat in 2 hours if headache persists or recurs. Do not take more than 3 a week.     No facility-administered medications prior to visit.      Allergies:   Ibuprofen; Amitriptyline; Clindamycin; Sulfa antibiotics; Sulfur; and Sulfamethoxazole   Past Medical History:  Diagnosis Date  . Cardiomyopathy (HCC)    a. Echo 01/2015: EF 45-50% with diffuse HK.  Marland Kitchen History of stress test    a. ETT 11/16:  normal  . Migraine   . Near syncope   . Snoring 04/06/2015  . SVT (supraventricular tachycardia) (HCC)    a. Seen on event monitor 01/2015.  Marland Kitchen Vertigo     Past Surgical History:  Procedure Laterality Date  . CYSTECTOMY     from ovaries  . TUBAL LIGATION Bilateral   . VAGINAL HYSTERECTOMY       Social History:  The patient  reports that she has never smoked. She has never used smokeless tobacco. She reports that she drinks  alcohol. She reports that she does not use drugs.   Family History:  The patient's family history includes Cancer in her father; Heart disease in her maternal aunt.    ROS:  Please see the history of present illness. All other systems are reviewed and  Negative to the above problem except as noted.    PHYSICAL EXAM: VS:  BP 110/64   Pulse 84   Ht 5\' 2"  (1.575 m)   Wt 165 lb (74.8 kg)   SpO2 98%   BMI 30.18 kg/m   GEN: Well nourished, well developed, in no acute distress HEENT: normal Neck: no JVD, carotid bruits, or masses Cardiac: RRR; no murmurs, rubs, or gallops,no edema  Respiratory:  clear to auscultation bilaterally, normal work of breathing GI: soft, nontender, nondistended, + BS  No hepatomegaly  MS: no deformity Moving all extremities   Skin: warm and dry, no rash Neuro:  Strength and sensation are intact Psych: euthymic mood, full affect   EKG:  EKG is not ordered today.   Lipid Panel    Component Value Date/Time   CHOL 147 09/28/2015 1217   TRIG 48 09/28/2015 1217   HDL 61 09/28/2015 1217   CHOLHDL 2.4 09/28/2015 1217  VLDL 10 09/28/2015 1217   LDLCALC 76 09/28/2015 1217      Wt Readings from Last 3 Encounters:  02/04/16 165 lb (74.8 kg)  11/23/15 163 lb (73.9 kg)  11/05/15 160 lb (72.6 kg)      ASSESSMENT AND PLAN: 1  Chronic systolic CHF with noncompaction  Very mild LV dysfunction  Doing good  I discussed pathology with pt at length  Would recomm continuing b blocker  Would add very low dose ARB   Follow up BMET in 2 wks    If dizzy  Stop  I will set to see pt in the spring      Current medicines are reviewed at length with the patient today.  The patient does not have concerns regarding medicines.  Signed, Dietrich PatesPaula Sabella Traore, MD  02/04/2016 9:28 AM    [facility] [address], [place], [place]  [address] Phone: [phone]; Fax: [phone]

## 2016-02-04 ENCOUNTER — Encounter (INDEPENDENT_AMBULATORY_CARE_PROVIDER_SITE_OTHER): Payer: Self-pay

## 2016-02-04 ENCOUNTER — Encounter: Payer: Self-pay | Admitting: Internal Medicine

## 2016-02-04 ENCOUNTER — Ambulatory Visit (INDEPENDENT_AMBULATORY_CARE_PROVIDER_SITE_OTHER): Payer: BLUE CROSS/BLUE SHIELD | Admitting: Internal Medicine

## 2016-02-04 VITALS — BP 110/64 | HR 84 | Ht 62.0 in | Wt 165.0 lb

## 2016-02-04 DIAGNOSIS — I429 Cardiomyopathy, unspecified: Secondary | ICD-10-CM | POA: Diagnosis not present

## 2016-02-04 MED ORDER — METOPROLOL SUCCINATE ER 25 MG PO TB24: 12.5000 mg | ORAL_TABLET | Freq: Every day | ORAL | 3 refills | Status: DC

## 2016-02-04 MED ORDER — LOSARTAN POTASSIUM 25 MG PO TABS: 12.5000 mg | ORAL_TABLET | Freq: Every day | ORAL | 3 refills | Status: DC

## 2016-02-04 NOTE — Patient Instructions (Signed)
Your physician has recommended you make the following change in your medication:  1.) start losartan 12.5 mg once a day  Your physician recommends that you return for lab work in: 2 weeks (BMET)  Your physician wants you to follow-up in: March, 2018 with Dr. Filbert Schilder will receive a reminder letter in the mail two months in advance. If you don't receive a letter, please call our office to schedule the follow-up appointment.

## 2016-02-18 ENCOUNTER — Other Ambulatory Visit: Payer: BLUE CROSS/BLUE SHIELD

## 2016-02-28 ENCOUNTER — Other Ambulatory Visit: Payer: Self-pay | Admitting: Neurology

## 2016-02-29 ENCOUNTER — Other Ambulatory Visit: Payer: Self-pay | Admitting: Neurology

## 2016-02-29 NOTE — Telephone Encounter (Signed)
RX sent to pharmacy  

## 2016-02-29 NOTE — Telephone Encounter (Signed)
Ok to fill until next follow-up, thanks

## 2016-02-29 NOTE — Telephone Encounter (Signed)
Patient seen last 04/19/15. Has follow-up 04/2016. Imitrex 50mg  prescribed by historical provider. Okay to refill?

## 2016-03-17 DIAGNOSIS — Z13220 Encounter for screening for lipoid disorders: Secondary | ICD-10-CM | POA: Diagnosis not present

## 2016-03-17 DIAGNOSIS — R875 Abnormal microbiological findings in specimens from female genital organs: Secondary | ICD-10-CM | POA: Diagnosis not present

## 2016-03-17 DIAGNOSIS — Z23 Encounter for immunization: Secondary | ICD-10-CM | POA: Diagnosis not present

## 2016-03-17 DIAGNOSIS — Z136 Encounter for screening for cardiovascular disorders: Secondary | ICD-10-CM | POA: Diagnosis not present

## 2016-03-17 DIAGNOSIS — E669 Obesity, unspecified: Secondary | ICD-10-CM | POA: Diagnosis not present

## 2016-03-17 DIAGNOSIS — Z0000 Encounter for general adult medical examination without abnormal findings: Secondary | ICD-10-CM | POA: Diagnosis not present

## 2016-03-17 DIAGNOSIS — Z124 Encounter for screening for malignant neoplasm of cervix: Secondary | ICD-10-CM | POA: Diagnosis not present

## 2016-04-23 ENCOUNTER — Ambulatory Visit: Payer: BLUE CROSS/BLUE SHIELD | Admitting: Neurology

## 2016-05-01 ENCOUNTER — Ambulatory Visit (INDEPENDENT_AMBULATORY_CARE_PROVIDER_SITE_OTHER): Payer: BLUE CROSS/BLUE SHIELD | Admitting: Neurology

## 2016-05-01 ENCOUNTER — Encounter: Payer: Self-pay | Admitting: Neurology

## 2016-05-01 VITALS — BP 110/72 | HR 85 | Ht 62.0 in | Wt 172.1 lb

## 2016-05-01 DIAGNOSIS — G4700 Insomnia, unspecified: Secondary | ICD-10-CM | POA: Diagnosis not present

## 2016-05-01 DIAGNOSIS — G43109 Migraine with aura, not intractable, without status migrainosus: Secondary | ICD-10-CM

## 2016-05-01 MED ORDER — FROVATRIPTAN SUCCINATE 2.5 MG PO TABS: ORAL_TABLET | ORAL | 6 refills | Status: DC

## 2016-05-01 MED ORDER — ZONISAMIDE 100 MG PO CAPS: ORAL_CAPSULE | ORAL | 6 refills | Status: DC

## 2016-05-01 NOTE — Patient Instructions (Addendum)
1. Start Zonisamide 100mg : Take 1 capsule at night for 2 weeks, then increase to 2 capsules at night for 2 weeks, then increase to 3 capsules at night 2. Take Frova 2.5mg  1 tablet at onset of migraine. May take second dose after 2 hours. Do not take more than 2-3 a week. 3. Keep a calendar of your headaches 4. Follow-up in 4 months, call for any changes

## 2016-05-01 NOTE — Progress Notes (Signed)
NEUROLOGY FOLLOW UP OFFICE NOTE  Jarome Matinabitha M Mckelvy [id number]  HISTORY OF PRESENT ILLNESS: I had the pleasure of seeing Lujean Amelabitha Vangilder in follow-up in the neurology clinic on 05/01/2016.  The patient was last seen a year ago for vertigo, headaches, and syncope. MRI brain unremarkable. She had an abnormal echo and was referred to Cardiology, who did a holter monitor with note of tachycardia with heart rates up to 180-190 bpm. It was felt that amitriptyline was the potential cause. 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 On her last visit, she reported headaches were not as bad as before. Sleep was her main issue and was started on Remeron. She presents today reporting that she stopped the Remeron 4 months ago because she felt "zoned out," unable to do her daily activities. She was also having a lot of weight gain. She does note that the headaches resolved with good sleep while she was on the Remeron, but now that she has stopped it, headaches are back and occurring on an almost daily basis. She had a really bad headache last week with phonophobia and floaters. No nausea/vomiting. She takes Tylenol, the Imitrex was not helping. She denies any further palpitations, no syncope since July/August. She has tried Trazodone in the past did not help. She reports amitriptyline made her have "crazy thoughts."  HPI 01/17/2015: This is a 40 yo RH woman presenting with vertigo, headaches, and episodes of passing out that started in 2009 or 2010. She had seen neurologist Dr. Shane CrutchStory in 2012 while living in [place], reporting that she is having the exact same symptoms she had, they had gotten under control while she was seeing him, with no episodes for a year, until January 2016 as she was transitioning for [place]. She reports migraines followed a couple of days later by vertigo. She would brace herself,  trying to sit still, but would lose consciousness. She has fallen to the floor at work. Sometimes she can brace herself, other times there is no warning. Symptoms are brief, she would wake up on the floor. She was [facility] for near syncope, she reported right-sided weakness with slurred speech, that improved during transport. Glucose noted to be 72. She had an MRI brain without contrast with no acute intracranial changes. MRA brain reported a 1.337mm bulbous appearance of the distal left PICA branches in the region of the inferior cerebellar vermis. It may represent a vascular loop, however a small aneurysm is not entirely excluded. 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 Records from her previous neurologist were reviewed. He was concerned that dizziness and vertigo were not physiological in nature. He had done an MRI/MRA brain reported as normal as well. TSH, RPR, CBC normal. B12 low normal at 278, she had received replacement treatment. For migraine prophylaxis, she was started on amitriptyline in 2012, then switched to Topamax in 2013. She reports migraines are retro-orbital, sometimes dull or sharp pain, with phonophobia, nausea. She recalls taking amitriptyline and Topamax, and states these were stopped because the episodes stopped. She recalls taking Imitrex, Fioricet, Maxalt which would help initially. She feels the amitriptyline and citalopram in the past helped put her in a calm mode. She has been having more frequent migraines occurring twice a week, lasting 3-4 hours. Ibuprofen does not help. The vertigo occurs a couple of days after a migraine,  with spinning sensation lasting up to a week. She usually gets less than 6 hours of sleep, feeling "real agitated." She reports the last syncopal episode was in June. When asked about stress, she does indicate that she has been working for [employer] since 2014, but had to move to a  different location with her move in January.   PAST MEDICAL HISTORY: Past Medical History:  Diagnosis Date  . Cardiomyopathy (HCC)    a. Echo 01/2015: EF 45-50% with diffuse HK.  Marland Kitchen History of stress test    a. ETT 11/16:  normal  . Migraine   . Near syncope   . Snoring 04/06/2015  . SVT (supraventricular tachycardia) (HCC)    a. Seen on event monitor 01/2015.  Marland Kitchen Vertigo     MEDICATIONS: Current Outpatient Prescriptions on File Prior to Visit  Medication Sig Dispense Refill  . losartan (COZAAR) 25 MG tablet Take 0.5 tablets (12.5 mg total) by mouth daily. 45 tablet 3  . metoprolol succinate (TOPROL-XL) 25 MG 24 hr tablet Take 0.5 tablets (12.5 mg total) by mouth daily. 45 tablet 3  . mirtazapine (REMERON) 15 MG tablet Take 1 tablet (15 mg total) by mouth at bedtime. 30 tablet 4  . Multiple Vitamins-Minerals (ADULT GUMMY) CHEW Chew 2 tablets by mouth daily.    . SUMAtriptan (IMITREX) 50 MG tablet TAKE 1 TABLET BY MOUTH AT ONSET OF HEADACHE,MAY REPEAT IN 2 HOURS IF NEEDED,NO MORE THAN 3TABS/WEEK 10 tablet 0   No current facility-administered medications on file prior to visit.     ALLERGIES: Allergies  Allergen Reactions  . Ibuprofen Other (See Comments) and Rash    Per Neurologist request Per Neurologist request  . Amitriptyline Other (See Comments) and Rash    Causing headaches and head pains Causing headaches and head pains  . Clindamycin Rash  . Sulfa Antibiotics Swelling  . Sulfur Swelling  . Sulfamethoxazole Swelling    FAMILY HISTORY: Family History  Problem Relation Age of Onset  . Cancer Father   . Hypertension    . Lupus    . Diabetes    . Heart disease Maternal Aunt     Patient does not know details  . Stroke Neg Hx   . Fainting Neg Hx   . Migraines Neg Hx   . Seizures Neg Hx     SOCIAL HISTORY: Social History   Social History  . Marital status: Single    Spouse name: N/A  . Number of children: 4  . Years of education: N/A   Occupational  History  . Not on file.   Social History Main Topics  . Smoking status: Never Smoker  . Smokeless tobacco: Never Used  . Alcohol use 0.0 oz/week     Comment: Rare - once or twice a month.  . Drug use: No  . Sexual activity: Not on file   Other Topics Concern  . Not on file   Social History Narrative   Lives with 3 of her sons.  Has 4 sons.  Works for [employer].  Education: some college.       REVIEW OF SYSTEMS: Constitutional: No fevers, chills, or sweats, no generalized fatigue, change in appetite Eyes: No visual changes, double vision, eye pain Ear, nose and throat: No hearing loss, ear pain, nasal congestion, sore throat Cardiovascular: No chest pain, palpitations Respiratory:  No shortness of breath at rest or with exertion, wheezes GastrointestinaI: No nausea, vomiting, diarrhea, abdominal pain, fecal incontinence Genitourinary:  No dysuria,  urinary retention or frequency Musculoskeletal:  No neck pain, +back pain Integumentary: No rash, pruritus, skin lesions Neurological: as above Psychiatric: No depression, +insomnia, no anxiety Endocrine: No palpitations, fatigue, diaphoresis, mood swings, change in appetite, change in weight, increased thirst Hematologic/Lymphatic:  No anemia, purpura, petechiae. Allergic/Immunologic: no itchy/runny eyes, nasal congestion, recent allergic reactions, rashes  PHYSICAL EXAM: Vitals:   05/01/16 1002  BP: 110/72  Pulse: 85   General: No acute distress Head:  Normocephalic/atraumatic Neck: supple, no paraspinal tenderness, full range of motion Heart:  Regular rate and rhythm Lungs:  Clear to auscultation bilaterally Back: No paraspinal tenderness Skin/Extremities: No rash, no edema Neurological Exam: alert and oriented to person, place, and time. No aphasia or dysarthria. Fund of knowledge is appropriate.  Recent and remote memory are intact.  Attention and concentration are normal.    Able to name objects and repeat phrases.  Cranial nerves: Pupils equal, round, reactive to light.  Fundoscopic exam unremarkable, no papilledema. Extraocular movements intact with no nystagmus. Visual fields full. Facial sensation intact. No facial asymmetry. Tongue, uvula, palate midline.  Motor: Bulk and tone normal, muscle strength 5/5 throughout with no pronator drift.  Sensation to light touch intact.  No extinction to double simultaneous stimulation.  Deep tendon reflexes 2+ throughout, toes downgoing.  Finger to nose testing intact.  Gait narrow-based and steady, able to tandem walk adequately.  Romberg negative.  IMPRESSION: This is a 40 yo RH woman with a history of headaches, dizziness, and syncope since 2009 or 2010, symptoms had quieted down, then recurred at the beginning of 2016. On review of records, her previous neurologist was concerned dizziness and vertigo were not physiologic in nature. She did not tolerate amitriptyline for headache prophylaxis, in addition, Cardiology felt this was causing SVT on holter monitor. Amitriptyline discontinued, she is now on Toprol. Her main issue was poor sleep, and on Remeron she got good sleep with resolution of headaches, however had side effects and stopped medication 4 months ago, now with recurrent on near-daily headaches. She reports sleep study was normal. She will start Zonisamide for headache prophylaxis, side effects were discussed, including drowsiness. This may help with sleep difficulties as well. She also reports Imitrex is not helping, and has tried Maxalt and Relpax in the past. A prescription for Frova was given to take at onset of migraine, side effects were discussed. She knows to minimize rescue medication to 2-3 a week to avoid rebound headaches. She will keep a calendar of her headaches and follow-up in 4 months.   Thank you for allowing me to participate in her care.  Please do not hesitate to call for any questions or concerns.  The duration of this appointment visit was 25  minutes of face-to-face time with the patient.  Greater than 50% of this time was spent in counseling, explanation of diagnosis, planning of further management, and coordination of care.   Patrcia Dolly, M.D.   CC: Harrietta Guardian FNP, Dr. Tenny Craw

## 2016-05-07 ENCOUNTER — Other Ambulatory Visit: Payer: Self-pay | Admitting: *Deleted

## 2016-05-07 MED ORDER — METOPROLOL SUCCINATE ER 25 MG PO TB24: 12.5000 mg | ORAL_TABLET | Freq: Every day | ORAL | 2 refills | Status: DC

## 2016-05-11 ENCOUNTER — Encounter: Payer: Self-pay | Admitting: Neurology

## 2016-06-02 ENCOUNTER — Telehealth: Payer: Self-pay

## 2016-06-02 NOTE — Telephone Encounter (Signed)
Patient called [facility] 05/31/16 at 6:05PM. Caller states the med that was sent in is very expensive, would like to have something called.   Tried to return patients call to verify what medication she is talking about.

## 2016-06-04 DIAGNOSIS — J069 Acute upper respiratory infection, unspecified: Secondary | ICD-10-CM | POA: Diagnosis not present

## 2016-06-04 DIAGNOSIS — R05 Cough: Secondary | ICD-10-CM | POA: Diagnosis not present

## 2016-06-04 DIAGNOSIS — R6889 Other general symptoms and signs: Secondary | ICD-10-CM | POA: Diagnosis not present

## 2016-06-04 NOTE — Telephone Encounter (Signed)
Patient called and states the Frova is going to cost her $500 and wants to know if there is something else can be prescribed. Please advise.

## 2016-06-06 ENCOUNTER — Telehealth: Payer: Self-pay

## 2016-06-06 MED ORDER — ZOLMITRIPTAN 2.5 MG PO TABS: ORAL_TABLET | ORAL | 5 refills | Status: DC

## 2016-06-06 NOTE — Addendum Note (Signed)
Addended by: Rosalyn Gess on: 06/06/2016 03:38 PM   Modules accepted: Orders

## 2016-06-06 NOTE — Telephone Encounter (Signed)
If she is not sure, let's give it a try and see if insurance approves this medication. Take Zomig 2.5mg  at onset of migraine. May take second dose after 2 hours. Do not take more than 2-3 a week. Dispense #10 with 5 refills. Thanks

## 2016-06-06 NOTE — Telephone Encounter (Signed)
Notified patient RX sent.

## 2016-06-06 NOTE — Telephone Encounter (Signed)
I have her listed as trying imitrex, Relpax, and Maxalt in the past. Has she been on Zomig in the past? Thanks

## 2016-06-06 NOTE — Telephone Encounter (Signed)
Error

## 2016-06-06 NOTE — Telephone Encounter (Signed)
Patient states she is not sure.

## 2016-06-17 ENCOUNTER — Encounter (HOSPITAL_COMMUNITY): Payer: Self-pay | Admitting: *Deleted

## 2016-07-04 ENCOUNTER — Emergency Department (HOSPITAL_COMMUNITY)
Admission: EM | Admit: 2016-07-04 | Discharge: 2016-07-04 | Disposition: A | Discharge status: Home / Self Care | Payer: Self-pay | Attending: Emergency Medicine | Admitting: Emergency Medicine

## 2016-07-04 ENCOUNTER — Emergency Department (HOSPITAL_COMMUNITY): Payer: Self-pay

## 2016-07-04 ENCOUNTER — Encounter (HOSPITAL_COMMUNITY): Payer: Self-pay | Admitting: Emergency Medicine

## 2016-07-04 DIAGNOSIS — R079 Chest pain, unspecified: Secondary | ICD-10-CM | POA: Diagnosis not present

## 2016-07-04 DIAGNOSIS — R0789 Other chest pain: Secondary | ICD-10-CM | POA: Insufficient documentation

## 2016-07-04 DIAGNOSIS — Z79899 Other long term (current) drug therapy: Secondary | ICD-10-CM | POA: Insufficient documentation

## 2016-07-04 LAB — BASIC METABOLIC PANEL
ANION GAP: 7 (ref 5–15)
BUN: 10 mg/dL (ref 6–20)
CO2: 18 mmol/L — AB (ref 22–32)
Calcium: 8.5 mg/dL — ABNORMAL LOW (ref 8.9–10.3)
Chloride: 111 mmol/L (ref 101–111)
Creatinine, Ser: 0.74 mg/dL (ref 0.44–1.00)
GFR calc Af Amer: >60 mL/min (ref >60)
GFR calc non Af Amer: >60 mL/min (ref >60)
GLUCOSE: 87 mg/dL (ref 65–99)
POTASSIUM: 4.2 mmol/L (ref 3.5–5.1)
Sodium: 136 mmol/L (ref 135–145)

## 2016-07-04 LAB — I-STAT TROPONIN, ED
Troponin i, poc: 0 ng/mL (ref 0.00–0.08)
Troponin i, poc: 0 ng/mL (ref 0.00–0.08)

## 2016-07-04 LAB — D-DIMER, QUANTITATIVE: D-Dimer, Quant: 3.08 ug/mL-FEU — ABNORMAL HIGH (ref 0.00–0.50)

## 2016-07-04 LAB — CBC
HEMATOCRIT: 38.2 % (ref 36.0–46.0)
HEMOGLOBIN: 12.6 g/dL (ref 12.0–15.0)
MCH: 28.3 pg (ref 26.0–34.0)
MCHC: 33 g/dL (ref 30.0–36.0)
MCV: 85.7 fL (ref 78.0–100.0)
Platelets: 255 10*3/uL (ref 150–400)
RBC: 4.46 MIL/uL (ref 3.87–5.11)
RDW: 13.6 % (ref 11.5–15.5)
WBC: 6 10*3/uL (ref 4.0–10.5)

## 2016-07-04 MED ORDER — TRAMADOL HCL 50 MG PO TABS: 50.0000 mg | ORAL_TABLET | Freq: Four times a day (QID) | ORAL | 0 refills | Status: DC | PRN

## 2016-07-04 MED ORDER — TRAMADOL HCL 50 MG PO TABS
50.0000 mg | ORAL_TABLET | Freq: Once | ORAL | Status: AC
  Administered 2016-07-04: 50 mg via ORAL
  Filled 2016-07-04: qty 1

## 2016-07-04 MED ORDER — ACETAMINOPHEN 500 MG PO TABS
1000.0000 mg | ORAL_TABLET | Freq: Once | ORAL | Status: AC
  Administered 2016-07-04: 1000 mg via ORAL
  Filled 2016-07-04: qty 2

## 2016-07-04 MED ORDER — IOPAMIDOL (ISOVUE-370) INJECTION 76%
INTRAVENOUS | Status: AC
  Administered 2016-07-04: 100 mL
  Filled 2016-07-04: qty 100

## 2016-07-04 NOTE — Discharge Instructions (Signed)
Return here as needed.  Follow-up with your primary doctor. °

## 2016-07-04 NOTE — ED Triage Notes (Signed)
Pt arrives from work via [employer] reporting 2 days of mild CP, reports pain got much worse today with weakness, near syncope, SOB. Pt denies N/V, LOC.  EMs reports giving: 324 ASA 4 Zofran 3 NTG with pain relief from 8/10 to 4/10  Resp e/u, skin warm and dry.  NAD noted at this time

## 2016-07-04 NOTE — ED Provider Notes (Signed)
[facility]-EMERGENCY DEPT Provider Note   CSN: [id number] Arrival date & time: 07/04/16  0909     History   Chief Complaint Chief Complaint  Patient presents with  . Chest Pain    HPI Tasha Ross is a 41 y.o. female.  HPI Patient presents to the emergency department with 2 day history of chest pressure.  The patient states that she has had a recent upper respiratory illness that she was diagnosed as pneumonia.  The patient states that she has had cough during this timeframe.  She states that she has some discomfort and pressure in the central portion of her chest.  She states that it has been constant over the last 2 days.  She states nothing seems make the condition better or worse.  She states that he did not take any medications prior to arrival.  She is also complaining of some headacheThe patient denies shortness of breath, headache,blurred vision, neck pain, fever, weakness, numbness, dizziness, anorexia, edema, abdominal pain, nausea, vomiting, diarrhea, rash, back pain, dysuria, hematemesis, bloody stool, near syncope, or syncope. Past Medical History:  Diagnosis Date  . Cardiomyopathy (HCC)    a. Echo 01/2015: EF 45-50% with diffuse HK.  Marland Kitchen History of stress test    a. ETT 11/16:  normal  . Migraine   . Near syncope   . Snoring 04/06/2015  . SVT (supraventricular tachycardia) (HCC)    a. Seen on event monitor 01/2015.  Marland Kitchen Vertigo     Patient Active Problem List   Diagnosis Date Noted  . Insomnia 04/19/2015  . Snoring 04/06/2015  . Cardiomyopathy (HCC)   . Head revolving around 02/06/2015  . Complicated migraine 01/17/2015  . Faintness 01/17/2015    Past Surgical History:  Procedure Laterality Date  . CYSTECTOMY     from ovaries  . TUBAL LIGATION Bilateral   . VAGINAL HYSTERECTOMY      OB History    No data available       Home Medications    Prior to Admission medications   Medication Sig Start Date End Date Taking? Authorizing Provider  azelastine  (ASTELIN) 0.1 % nasal spray Place 2 sprays into both nostrils daily. 05/31/16  Yes Historical Provider, MD  fluticasone (FLONASE) 50 MCG/ACT nasal spray Place 2 sprays into both nostrils daily.   Yes Historical Provider, MD  losartan (COZAAR) 25 MG tablet Take 0.5 tablets (12.5 mg total) by mouth daily. 02/04/16 07/04/16 Yes Pricilla Riffle, MD  metoprolol succinate (TOPROL-XL) 25 MG 24 hr tablet Take 0.5 tablets (12.5 mg total) by mouth daily. 05/07/16  Yes Pricilla Riffle, MD  ZOLMitriptan (ZOMIG) 2.5 MG tablet Take at onset of migraine. May repeat in 2 hours if headache persists or recurs. Do not take more than 2-3 a week. 06/06/16  Yes Van Clines, MD  zonisamide (ZONEGRAN) 100 MG capsule Take 1 capsule at night for 2 weeks, then increase to 2 capsules at night for 2 weeks, then increase to 3 capsules at night 05/01/16  Yes Van Clines, MD  frovatriptan (FROVA) 2.5 MG tablet If recurs, may repeat after 2 hours. Do not take more than 3 a week Patient not taking: Reported on 07/04/2016 05/01/16   Van Clines, MD  SUMAtriptan (IMITREX) 50 MG tablet TAKE 1 TABLET BY MOUTH AT ONSET OF HEADACHE,MAY REPEAT IN 2 HOURS IF NEEDED,NO MORE THAN 3TABS/WEEK Patient not taking: Reported on 05/01/2016 02/29/16   Van Clines, MD  traMADol (ULTRAM) 50 MG tablet Take  1 tablet (50 mg total) by mouth every 6 (six) hours as needed for severe pain. 07/04/16   Charlestine Night, PA-C    Family History Family History  Problem Relation Age of Onset  . Cancer Father   . Hypertension    . Lupus    . Diabetes    . Heart disease Maternal Aunt     Patient does not know details  . Stroke Neg Hx   . Fainting Neg Hx   . Migraines Neg Hx   . Seizures Neg Hx     Social History Social History  Substance Use Topics  . Smoking status: Never Smoker  . Smokeless tobacco: Never Used  . Alcohol use 0.0 oz/week     Comment: Rare - once or twice a month.     Allergies   Ibuprofen; Amitriptyline; Clindamycin; Sulfa  antibiotics; Sulfur; and Sulfamethoxazole   Review of Systems Review of Systems All other systems negative except as documented in the HPI. All pertinent positives and negatives as reviewed in the HPI.  Physical Exam Updated Vital Signs BP 92/66 (BP Location: Right Arm)   Pulse 76   Temp 98.3 F (36.8 C) (Oral)   Resp 17   Ht 5\' 2"  (1.575 m)   Wt 74.8 kg   SpO2 99%   BMI 30.18 kg/m   Physical Exam  Constitutional: She is oriented to person, place, and time. She appears well-developed and well-nourished. No distress.  HENT:  Head: Normocephalic and atraumatic.  Mouth/Throat: Oropharynx is clear and moist.  Eyes: Pupils are equal, round, and reactive to light.  Neck: Normal range of motion. Neck supple.  Cardiovascular: Normal rate, regular rhythm and normal heart sounds.  Exam reveals no gallop and no friction rub.   No murmur heard. Pulmonary/Chest: Effort normal and breath sounds normal. No respiratory distress. She has no wheezes.  Abdominal: Soft. Bowel sounds are normal. She exhibits no distension. There is no tenderness.  Neurological: She is alert and oriented to person, place, and time. She exhibits normal muscle tone. Coordination normal.  Skin: Skin is warm and dry. No rash noted. No erythema.  Psychiatric: She has a normal mood and affect. Her behavior is normal.  Nursing note and vitals reviewed.    ED Treatments / Results  Labs (all labs ordered are listed, but only abnormal results are displayed) Labs Reviewed  BASIC METABOLIC PANEL - Abnormal; Notable for the following:       Result Value   CO2 18 (*)    Calcium 8.5 (*)    All other components within normal limits  D-DIMER, QUANTITATIVE (NOT AT [facility]) - Abnormal; Notable for the following:    D-Dimer, Quant 3.08 (*)    All other components within normal limits  CBC  I-STAT TROPOININ, ED  I-STAT TROPOININ, ED    EKG  EKG Interpretation  Date/Time:  Friday July 04 2016 09:22:45 EST Ventricular  Rate:  72 PR Interval:    QRS Duration: 81 QT Interval:  374 QTC Calculation: 410 R Axis:   40 Text Interpretation:  Sinus rhythm No significant change since last tracing Confirmed by ISAACS MD, CAMERON [phone]) on 07/04/2016 9:44:37 AM       Radiology Dg Chest 2 View  Result Date: 07/04/2016 CLINICAL DATA:  Mid chest pain for 2 days, worsening pain today EXAM: CHEST  2 VIEW COMPARISON:  11/05/2015 FINDINGS: Cardiomediastinal silhouette is stable. No infiltrate or pleural effusion. No pulmonary edema. Bony thorax is unremarkable. IMPRESSION: No active cardiopulmonary  disease. Electronically Signed   By: Natasha Mead M.D.   On: 07/04/2016 10:20   Ct Angio Chest Pe W/cm &/or Wo Cm  Result Date: 07/04/2016 CLINICAL DATA:  Chest pain, shortness of breath, and elevated D-dimer. EXAM: CT ANGIOGRAPHY CHEST WITH CONTRAST TECHNIQUE: Multidetector CT imaging of the chest was performed using the standard protocol during bolus administration of intravenous contrast. Multiplanar CT image reconstructions and MIPs were obtained to evaluate the vascular anatomy. CONTRAST:  100 mL Isovue 370 COMPARISON:  Chest radiographs 07/04/2016 FINDINGS: Cardiovascular: Pulmonary arterial opacification is adequate without evidence of emboli. The thoracic aorta is normal in caliber. The heart is normal in size. No pericardial effusion. Mediastinum/Nodes: No enlarged axillary, mediastinal, or hilar lymph nodes. Lungs/Pleura: No pleural effusion or pneumothorax. Mild respiratory motion artifact with mild dependent atelectasis bilaterally. No mass. Upper Abdomen: 1.7 cm low density left adrenal nodule compatible with an adenoma. Musculoskeletal: No acute osseous abnormality or suspicious osseous lesion. Review of the MIP images confirms the above findings. IMPRESSION: 1. No evidence of pulmonary emboli or other acute abnormality. 2. 1.7 cm left adrenal adenoma. Electronically Signed   By: Sebastian Ache M.D.   On: 07/04/2016 12:44     Procedures Procedures (including critical care time)  Medications Ordered in ED Medications  acetaminophen (TYLENOL) tablet 1,000 mg (1,000 mg Oral Given 07/04/16 1156)  iopamidol (ISOVUE-370) 76 % injection (100 mLs  Contrast Given 07/04/16 1214)  traMADol (ULTRAM) tablet 50 mg (50 mg Oral Given 07/04/16 1528)     Initial Impression / Assessment and Plan / ED Course  I have reviewed the triage vital signs and the nursing notes.  Pertinent labs & imaging results that were available during my care of the patient were reviewed by me and considered in my medical decision making (see chart for details).     I do see that the patient has history of cardiomyopathy but feels this is not related to that at this time.  Patient has chest discomfort that has been fairly constant and seems to be more in line with the recent cough and upper respiratory symptoms that she has experienced advised patient to follow with her primary care Dr. told to return here as needed.  I did advise that this is not excluded her heart but only gives [place] reassurance that there is no acute issue at this time  Final Clinical Impressions(s) / ED Diagnoses   Final diagnoses:  Atypical chest pain    New Prescriptions New Prescriptions   TRAMADOL (ULTRAM) 50 MG TABLET    Take 1 tablet (50 mg total) by mouth every 6 (six) hours as needed for severe pain.     Charlestine Night, PA-C 07/04/16 1609    Shaune Pollack, MD 07/04/16 [phone]

## 2016-07-07 NOTE — Progress Notes (Signed)
CARDIOLOGY OFFICE NOTE  Date:  07/08/2016    Tasha Ross Date of Birth: 10/26/1975 Medical Record #[id number]  PCP:  Dolan Amen, FNP  Cardiologist:  Tenny Craw  Chief Complaint  Patient presents with  . Chest Pain    Work in visit - seen for Dr. Tenny Craw    History of Present Illness: Tasha Ross is a 41 y.o. female who presents today for a work in visit. Seen for Dr. Tenny Craw.   She has a history of SVT and dizziness. She has had an Echo with prior LVEF 45 to 50% Event monitor SVT 180 to 190 - asymptomatic Case reviewed previously with Odessa Fleming at who felt that her "SVT" was actually Sinus tach. Recommended to liberalize salt, wear abdominal binder, etc..  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 She has had a normal GXT back in November of 2016.   I saw her back in July of 2017 - she felt like she could not get enough air - had a multitude of somatic complaints. Her history was very hard for me to follow and I did not feel like I was able to help her - seemed way beyond my scope - referred for CPX - see below.   Last seen by Dr. Tenny Craw back in October. She was doing well at that time. Was to be seen back in the spring.   In the ER last week - 2 days of chest pressure. Had had recent URI. No acute issues noted and negative evaluation. EKG with NSR.   Comes in today. Here alone. Says she still has a "touch of pneumonia". Otherwise feels "unchanged". Tells me she was at [employer] - had acute onset of chest pressure - hard to breath - EMS was called. Treated with aspirin and NTG. Says the NTG helped her. Starting to feel "the same" - says like when she saw her PCP back in January - ?URI.  Still "coughing up cold". Chest is sore. She does not exercise. Notes weight gain but says her diet is good.   Past Medical History:  Diagnosis Date  . Cardiomyopathy (HCC)    a. Echo 01/2015: EF 45-50% with diffuse HK.  Marland Kitchen  History of stress test    a. ETT 11/16:  normal  . Migraine   . Near syncope   . Snoring 04/06/2015  . SVT (supraventricular tachycardia) (HCC)    a. Seen on event monitor 01/2015.  Marland Kitchen Vertigo     Past Surgical History:  Procedure Laterality Date  . CYSTECTOMY     from ovaries  . TUBAL LIGATION Bilateral   . VAGINAL HYSTERECTOMY       Medications: Current Outpatient Prescriptions  Medication Sig Dispense Refill  . azelastine (ASTELIN) 0.1 % nasal spray Place 2 sprays into both nostrils daily.  11  . fluticasone (FLONASE) 50 MCG/ACT nasal spray Place 2 sprays into both nostrils daily.    . metoprolol succinate (TOPROL-XL) 25 MG 24 hr tablet Take 0.5 tablets (12.5 mg total) by mouth daily. 45 tablet 2  . ZOLMitriptan (ZOMIG) 2.5 MG tablet Take at onset of migraine. May repeat in 2 hours if headache persists or recurs. Do not take more than 2-3 a week. 10 tablet 5  . zonisamide (ZONEGRAN) 100 MG capsule Take 1 capsule at night for 2 weeks, then increase to 2 capsules at night for 2 weeks, then increase to 3 capsules at night 90  capsule 6  . losartan (COZAAR) 25 MG tablet Take 0.5 tablets (12.5 mg total) by mouth daily. 45 tablet 3   No current facility-administered medications for this visit.     Allergies: Allergies  Allergen Reactions  . Ibuprofen Other (See Comments) and Rash    Per Neurologist request Per Neurologist request  . Amitriptyline Other (See Comments) and Rash    Causing headaches and head pains Causing headaches and head pains  . Clindamycin Rash  . Sulfa Antibiotics Swelling  . Sulfur Swelling  . Sulfamethoxazole Swelling    Social History: The patient  reports that she has never smoked. She has never used smokeless tobacco. She reports that she drinks alcohol. She reports that she does not use drugs.No smoking noted.    Family History: The patient's family history includes Cancer in her father; Heart disease in her maternal aunt. Mother and father still  alive with no heart issues.   Review of Systems: Please see the history of present illness.   Otherwise, the review of systems is positive for none.   All other systems are reviewed and negative.   Physical Exam: VS:  BP 104/80   Pulse 87   Ht 5\' 2"  (1.575 m)   Wt 172 lb 12.8 oz (78.4 kg)   SpO2 100% Comment: at rest  BMI 31.61 kg/m  .  BMI Body mass index is 31.61 kg/m.  Wt Readings from Last 3 Encounters:  07/08/16 172 lb 12.8 oz (78.4 kg)  07/04/16 165 lb (74.8 kg)  05/01/16 172 lb 1 oz (78 kg)    General:  Obese. She is alert and in no acute distress.  Weight continues to climb.  HEENT: Normal.  Neck: Supple, no JVD, carotid bruits, or masses noted.  Cardiac: Regular rate and rhythm. No murmurs, rubs, or gallops. No edema.  Respiratory:  Lungs are clear to auscultation bilaterally with normal work of breathing.  GI: Soft and nontender.  MS: No deformity or atrophy. Gait and ROM intact.  Skin: Warm and dry. Color is normal.  Neuro:  Strength and sensation are intact and no gross focal deficits noted.  Psych: Alert, appropriate and with normal affect.   LABORATORY DATA:  EKG:  EKG is not ordered today. EKG from the ER noted.   Lab Results  Component Value Date   WBC 6.0 07/04/2016   HGB 12.6 07/04/2016   HCT 38.2 07/04/2016   PLT 255 07/04/2016   GLUCOSE 87 07/04/2016   CHOL 147 09/28/2015   TRIG 48 09/28/2015   HDL 61 09/28/2015   LDLCALC 76 09/28/2015   ALT 11 (L) 01/25/2015   AST 17 01/25/2015   NA 136 07/04/2016   K 4.2 07/04/2016   CL 111 07/04/2016   CREATININE 0.74 07/04/2016   BUN 10 07/04/2016   CO2 18 (L) 07/04/2016   TSH 1.18 11/26/2015   INR 1.1 09/14/2007    BNP (last 3 results)  Recent Labs  11/26/15 1101  BNP 26.7    ProBNP (last 3 results) No results for input(s): PROBNP in the last 8760 hours.   Other Studies Reviewed Today:  CPX 12/2015 Conclusion: Exercise testing with gas exchange demonstrates a mild functional impairment  when compared to matched sedentary norms. Patient appears primarily circulatory limited with elevated VE/VCO2 slope and flat O2 pulse (given HR response was adequate). Patient's body habitus appears to be contributing to her exercise intolerance as well.   Test, report and preliminary impression by: Lesia Hausen, MS, ACSM-RCEP 12/28/2015  3:15 PM  There is a mild to moderate circulatory limitation present due to HF.   Bensimhon, Daniel,MD 10:50 PM  Notes Recorded by Pricilla Riffle, MD on 01/01/2016 at 11:20 AM EDT Spoke to patient about CPX Mild limitation (size, some circulatory) She is still getting dizzy Syncope 2 wks ago Recomm salt, fluid, elevate head of bead, Spanx I would recomm that she get appt with me in a few wks for orthostatics, Labs (BMET, UA) Overbook as needed   Event Monitor Study Highlights 11/2015   Sinus rhythm, sinus tachycardia  Occasional PVC Symptoms did not always correlate with signif elevated HR     Echo Study Conclusions from 01/2015  - Left ventricle: The cavity size was mildly dilated. Systolic  function was mildly reduced. The estimated ejection fraction was  in the range of 45% to 50%. Diffuse hypokinesis. - Atrial septum: No defect or patent foramen ovale was identified.  Cardiac MRI FINDINGS from 03/2015: 1. Mild left ventricular dilatation with normal wall thickness and normal systolic function (LVEF = 55%).  There are no regional wall motion abnormalities.  There are pronounced trabeculations in the apical and mid segments with compacted to non-compacted ratio > 2.3:1 when measured in long axis views.  LVEDD: 55 mm  LVESD: 36 mm  LVEDV: 131 ml  LVESV: 59 ml  SV: 73 ml  CO: 5.8 L/minute  2. Normal right ventricular size, thickness and systolic function (LVEF = 53%) with no regional wall motion abnormalities.  3. Normal biatrial size.  4. Normal size of the aortic root, ascending aorta and  pulmonary artery.  5. Mild mitral and trivial tricuspid regurgitation.  6. No late gadolinium enhancement in the LV and RV myocardium.  7. No pericardial effusion.  IMPRESSION: 1. Mild left ventricular dilatation with normal wall thickness and normal systolic function (LVEF = 55%) with no regional wall motion abnormalities.  There are pronounced trabeculations in the apical and mid segments with compacted to non-compacted ratio > 2.3:1 when measured in long axis views.  2. Normal right ventricular size, thickness and systolic function (LVEF = 53%) with no regional wall motion abnormalities.  3. Mild mitral and trivial tricuspid regurgitation.  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 A standard CHF therapy is recommended.  Tobias Alexander  Electronically Signed  By: Tobias Alexander  On: 03/09/2015 18:19   Assessment/Plan:  1. Chest pain - recent URI - still with cough and congestion - restart Zithromax and have her follow up with her PCP. I do not think she needs further work up this time from our standpoint. No +FH. Does not smoke. Would consider GXT if symptoms continue.   2. 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 On very little medicine due to soft BP and prior syncope.   3. Sinus tach on prior monitor - not an issue at this time.   4. Syncope - not reported today  5. Obesity - did not wish to discuss weight loss recommendations - denies needing to change diet/add exercise.    Current medicines are reviewed with the patient today.  The patient does not have concerns regarding medicines other than what has been noted above.  The following changes have been made:  See above.  Labs/ tests ordered today include:   No orders of the defined types were placed in this encounter.    Disposition:   FU with Dr. Tenny Craw and her team in 6  weeks.    Patient is agreeable to this plan and will call if any problems develop in the interim.   SignedNorma Fredrickson, NP  07/08/2016 8:14 AM  [facility] [address] [place], [place]  [address] Phone: [phone] Fax: [phone]

## 2016-07-08 ENCOUNTER — Other Ambulatory Visit: Payer: Self-pay | Admitting: Internal Medicine

## 2016-07-08 ENCOUNTER — Ambulatory Visit (INDEPENDENT_AMBULATORY_CARE_PROVIDER_SITE_OTHER): Payer: BLUE CROSS/BLUE SHIELD | Admitting: Nurse Practitioner

## 2016-07-08 ENCOUNTER — Encounter: Payer: Self-pay | Admitting: Nurse Practitioner

## 2016-07-08 VITALS — BP 104/80 | HR 87 | Ht 62.0 in | Wt 172.8 lb

## 2016-07-08 DIAGNOSIS — R000 Tachycardia, unspecified: Secondary | ICD-10-CM

## 2016-07-08 DIAGNOSIS — R0789 Other chest pain: Secondary | ICD-10-CM

## 2016-07-08 DIAGNOSIS — I428 Other cardiomyopathies: Secondary | ICD-10-CM

## 2016-07-08 MED ORDER — AZITHROMYCIN 250 MG PO TABS: ORAL_TABLET | ORAL | 0 refills | Status: DC

## 2016-07-08 MED ORDER — LOSARTAN POTASSIUM 25 MG PO TABS: 12.5000 mg | ORAL_TABLET | Freq: Every day | ORAL | 3 refills | Status: DC

## 2016-07-08 NOTE — Patient Instructions (Addendum)
We will be checking the following labs today - NONE   Medication Instructions:    Continue with your current medicines.   I am giving you a Zpak to take as directed. This has been sent to your drug store.     Testing/Procedures To Be Arranged:  N/A  Follow-Up:   See Dr. Tenny Craw in a few months  Call your PCP to arrange follow up     Other Special Instructions:   N/A    If you need a refill on your cardiac medications before your next appointment, please call your pharmacy.   Call the [facility] office at [phone] if you have any questions, problems or concerns.

## 2016-08-04 ENCOUNTER — Other Ambulatory Visit: Payer: Self-pay

## 2016-08-04 DIAGNOSIS — G43109 Migraine with aura, not intractable, without status migrainosus: Secondary | ICD-10-CM

## 2016-08-04 MED ORDER — ZONISAMIDE 100 MG PO CAPS: ORAL_CAPSULE | ORAL | 1 refills | Status: DC

## 2016-08-13 NOTE — Progress Notes (Signed)
Cardiology Office Note   Date:  08/15/2016   ID:  Tasha Ross, Tasha Ross 12-12-1975, MRN [id number]  PCP:  Dolan Amen, FNP  Cardiologist:   Dietrich Pates, MD    F/U of palpitations  Noncompaction, CP   History of Present Illness: Tasha Ross is a 41 y.o. female with a history of dizziness, sinus tach.  LVEF on echo was 53% with evid of noncompaction.  I saw her in Oct 2017  An ARB was added to regimn   MRI LVEF 55%  Normal GXT in Nov 2017   Pt seen in ED for CP in Feb  Seen by Darrel Hoover in March   Since seen she has done well  Breathing is good  No CP  Walking a lot at work  No SOB   No palpitations    Current Meds  Medication Sig  . azelastine (ASTELIN) 0.1 % nasal spray Place 2 sprays into both nostrils daily.  . fluticasone (FLONASE) 50 MCG/ACT nasal spray Place 2 sprays into both nostrils daily.  Marland Kitchen losartan (COZAAR) 25 MG tablet Take 0.5 tablets (12.5 mg total) by mouth daily.  . metoprolol succinate (TOPROL-XL) 25 MG 24 hr tablet Take 0.5 tablets (12.5 mg total) by mouth daily.  Marland Kitchen zonisamide (ZONEGRAN) 100 MG capsule Take 3 capsule at night.     Allergies:   Ibuprofen; Amitriptyline; Clindamycin; Sulfa antibiotics; Sulfur; and Sulfamethoxazole   Past Medical History:  Diagnosis Date  . Cardiomyopathy (HCC)    a. Echo 01/2015: EF 45-50% with diffuse HK.  Marland Kitchen History of stress test    a. ETT 11/16:  normal  . Migraine   . Near syncope   . Snoring 04/06/2015  . SVT (supraventricular tachycardia) (HCC)    a. Seen on event monitor 01/2015.  Marland Kitchen Vertigo     Past Surgical History:  Procedure Laterality Date  . CYSTECTOMY     from ovaries  . TUBAL LIGATION Bilateral   . VAGINAL HYSTERECTOMY       Social History:  The patient  reports that she has never smoked. She has never used smokeless tobacco. She reports that she drinks alcohol. She reports that she does not use drugs.   Family History:  The patient's family history includes Cancer in her father; Heart  disease in her maternal aunt.    ROS:  Please see the history of present illness. All other systems are reviewed and  Negative to the above problem except as noted.    PHYSICAL EXAM: VS:  BP 110/62   Pulse 92   Ht 5\' 2"  (1.575 m)   Wt 171 lb 12.8 oz (77.9 kg)   SpO2 98%   BMI 31.42 kg/m   GEN: Well nourished, well developed, in no acute distress  HEENT: normal  Neck: no JVD, carotid bruits, or masses Cardiac: RRR; no murmurs, rubs, or gallops,no edema  Respiratory:  clear to auscultation bilaterally, normal work of breathing GI: soft, nontender, nondistended, + BS  No hepatomegaly  MS: no deformity Moving all extremities   Skin: warm and dry, no rash Neuro:  Strength and sensation are intact Psych: euthymic mood, full affect   EKG:  EKG is not ordered today.   Lipid Panel    Component Value Date/Time   CHOL 147 09/28/2015 1217   TRIG 48 09/28/2015 1217   HDL 61 09/28/2015 1217   CHOLHDL 2.4 09/28/2015 1217   VLDL 10 09/28/2015 1217   LDLCALC 76 09/28/2015 1217  Wt Readings from Last 3 Encounters:  08/15/16 171 lb 12.8 oz (77.9 kg)  07/08/16 172 lb 12.8 oz (78.4 kg)  07/04/16 165 lb (74.8 kg)      ASSESSMENT AND PLAN:  1  CP  Denies    2  noncompaction cardiomyopathy  LVEF 55%  Currently on Cozaar  Tolerating well  Keep on current regimen   3  HCM  Lipids excellent 10 months ago    4  CT  Evid of possible adrenal adenoma  AWill review with radiology     Current medicines are reviewed at length with the patient today.  The patient does not have concerns regarding medicines.  Signed, Dietrich Pates, MD  08/15/2016 8:14 AM    [facility] [address] [place], [place], [place]  [address] Phone: [phone]; Fax: [phone]

## 2016-08-15 ENCOUNTER — Ambulatory Visit (INDEPENDENT_AMBULATORY_CARE_PROVIDER_SITE_OTHER): Payer: BLUE CROSS/BLUE SHIELD | Admitting: Internal Medicine

## 2016-08-15 ENCOUNTER — Encounter (INDEPENDENT_AMBULATORY_CARE_PROVIDER_SITE_OTHER): Payer: Self-pay

## 2016-08-15 ENCOUNTER — Encounter: Payer: Self-pay | Admitting: Internal Medicine

## 2016-08-15 VITALS — BP 110/62 | HR 92 | Ht 62.0 in | Wt 171.8 lb

## 2016-08-15 DIAGNOSIS — I428 Other cardiomyopathies: Secondary | ICD-10-CM

## 2016-08-15 DIAGNOSIS — R072 Precordial pain: Secondary | ICD-10-CM | POA: Diagnosis not present

## 2016-08-15 NOTE — Patient Instructions (Signed)
Your physician recommends that you continue on your current medications as directed. Please refer to the Current Medication list given to you today. Your physician wants you to follow-up in: 6 months with Dr. Ross.  You will receive a reminder letter in the mail two months in advance. If you don't receive a letter, please call our office to schedule the follow-up appointment.  

## 2016-08-29 ENCOUNTER — Ambulatory Visit (INDEPENDENT_AMBULATORY_CARE_PROVIDER_SITE_OTHER): Payer: BLUE CROSS/BLUE SHIELD | Admitting: Neurology

## 2016-08-29 ENCOUNTER — Encounter: Payer: Self-pay | Admitting: Neurology

## 2016-08-29 VITALS — BP 104/78 | HR 88 | Wt 171.0 lb

## 2016-08-29 DIAGNOSIS — G43109 Migraine with aura, not intractable, without status migrainosus: Secondary | ICD-10-CM

## 2016-08-29 DIAGNOSIS — R55 Syncope and collapse: Secondary | ICD-10-CM | POA: Diagnosis not present

## 2016-08-29 DIAGNOSIS — G4700 Insomnia, unspecified: Secondary | ICD-10-CM | POA: Diagnosis not present

## 2016-08-29 MED ORDER — ZOLMITRIPTAN 2.5 MG PO TBDP: 2.5000 mg | ORAL_TABLET | ORAL | 5 refills | Status: DC | PRN

## 2016-08-29 MED ORDER — ZONISAMIDE 100 MG PO CAPS: ORAL_CAPSULE | ORAL | 6 refills | Status: DC

## 2016-08-29 NOTE — Progress Notes (Signed)
NEUROLOGY FOLLOW UP OFFICE NOTE  Tasha Ross [id number]  HISTORY OF PRESENT ILLNESS: I had the pleasure of seeing Tasha Ross in follow-up in the neurology clinic on 08/29/2016.  The patient was last seen 4 months ago for vertigo, headaches, and syncope. MRI brain unremarkable. She had an abnormal echo and was referred to Cardiology, who did a holter monitor with note of tachycardia with heart rates up to 180-190 bpm. It was felt that amitriptyline was the potential cause. 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 On her last visit, insomnia was her main issue, which she felt was causing a lot of the headaches. She was started on Zonisamide for headache prophylaxis. She states it does cause her to be drowsy to fall asleep, but it does not keep her asleep. She has noticed an improvement in the headaches, she was not having the bad headaches anymore until she had a syncopal episode at work last 4/17 and has had a headache, neck pain, and seeing floaters since then. 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 No convulsive activity seen on video. She came to and recalls feeling sick to her stomach and unable to talk, with her words not coming out clear. She reports another syncopal episode in March on the way to work, she felt a pressure in her chest. She was recently treated with antibiotic for congestion, but has started feeling ill again. She wonders if weather changes are a trigger, she has become more hypersensitive to smells since Spring started.   HPI 01/17/2015: This is a 41 yo RH woman presenting with vertigo, headaches, and episodes of passing out that started in 2009 or 2010. She had seen neurologist Dr. Shane Crutch in 2012 while living in [place], reporting that she is having the exact same symptoms she had, they  had gotten under control while she was seeing him, with no episodes for a year, until January 2016 as she was transitioning for her move to [facility] last March 2016. She reports migraines followed a couple of days later by vertigo. She would brace herself, trying to sit still, but would lose consciousness. She has fallen to the floor at work. Sometimes she can brace herself, other times there is no warning. Symptoms are brief, she would wake up on the floor. She was admitted to [facility] last 10/22/14 for near syncope, she reported right-sided weakness with slurred speech, that improved during transport. Glucose noted to be 72. She had an MRI brain without contrast with no acute intracranial changes. MRA brain reported a 1.22mm bulbous appearance of the distal left PICA branches in the region of the inferior cerebellar vermis. It may represent a vascular loop, however a small aneurysm is not entirely excluded. 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 Records from her previous neurologist were reviewed. He was concerned that dizziness and vertigo were not physiological in nature. He had done an MRI/MRA brain reported as normal as well. TSH, RPR, CBC normal. B12 low normal at 278, she had received replacement treatment. For migraine prophylaxis, she was started on amitriptyline in 2012, then switched to Topamax in 2013. She reports migraines are retro-orbital, sometimes dull or sharp pain, with phonophobia, nausea. She recalls taking amitriptyline and Topamax, and states these were stopped because the episodes stopped. She recalls taking  Imitrex, Fioricet, Maxalt which would help initially. She feels the amitriptyline and citalopram in the past helped put her in a calm mode. She has been having more frequent migraines occurring twice a week, lasting 3-4 hours. Ibuprofen does not help. The vertigo occurs a couple of days after a migraine, with spinning sensation lasting  up to a week. She usually gets less than 6 hours of sleep, feeling "real agitated." She reports the last syncopal episode was in June. When asked about stress, she does indicate that she has been working for [employer] since 2014, but had to move to a different location with her move in January.   PAST MEDICAL HISTORY: Past Medical History:  Diagnosis Date  . Cardiomyopathy (HCC)    a. Echo 01/2015: EF 45-50% with diffuse HK.  Marland Kitchen History of stress test    a. ETT 11/16:  normal  . Migraine   . Near syncope   . Snoring 04/06/2015  . SVT (supraventricular tachycardia) (HCC)    a. Seen on event monitor 01/2015.  Marland Kitchen Vertigo     MEDICATIONS: Current Outpatient Prescriptions on File Prior to Visit  Medication Sig Dispense Refill  . azelastine (ASTELIN) 0.1 % nasal spray Place 2 sprays into both nostrils daily.  11  . fluticasone (FLONASE) 50 MCG/ACT nasal spray Place 2 sprays into both nostrils daily.    Marland Kitchen losartan (COZAAR) 25 MG tablet Take 0.5 tablets (12.5 mg total) by mouth daily. 45 tablet 3  . metoprolol succinate (TOPROL-XL) 25 MG 24 hr tablet Take 0.5 tablets (12.5 mg total) by mouth daily. 45 tablet 2  . zonisamide (ZONEGRAN) 100 MG capsule Take 3 capsule at night. 90 capsule 1   No current facility-administered medications on file prior to visit.     ALLERGIES: Allergies  Allergen Reactions  . Ibuprofen Other (See Comments) and Rash    Per Neurologist request Per Neurologist request  . Amitriptyline Other (See Comments) and Rash    Causing headaches and head pains Causing headaches and head pains  . Clindamycin Rash  . Sulfa Antibiotics Swelling  . Sulfur Swelling  . Sulfamethoxazole Swelling    FAMILY HISTORY: Family History  Problem Relation Age of Onset  . Cancer Father   . Hypertension    . Lupus    . Diabetes    . Heart disease Maternal Aunt     Patient does not know details  . Stroke Neg Hx   . Fainting Neg Hx   . Migraines Neg Hx   . Seizures Neg Hx      SOCIAL HISTORY: Social History   Social History  . Marital status: Single    Spouse name: N/A  . Number of children: 4  . Years of education: N/A   Occupational History  . Not on file.   Social History Main Topics  . Smoking status: Never Smoker  . Smokeless tobacco: Never Used  . Alcohol use 0.0 oz/week     Comment: Rare - once or twice a month.  . Drug use: No  . Sexual activity: Not on file   Other Topics Concern  . Not on file   Social History Narrative   Lives with 3 of her sons.  Has 4 sons.  Works for [employer].  Education: some college.       REVIEW OF SYSTEMS: Constitutional: No fevers, chills, or sweats, no generalized fatigue, change in appetite Eyes: No visual changes, double vision, eye pain Ear, nose and throat: No hearing  loss, ear pain, nasal congestion, sore throat Cardiovascular: No chest pain, palpitations Respiratory:  No shortness of breath at rest or with exertion, wheezes GastrointestinaI: No nausea, vomiting, diarrhea, abdominal pain, fecal incontinence Genitourinary:  No dysuria, urinary retention or frequency Musculoskeletal:  No neck pain, +back pain Integumentary: No rash, pruritus, skin lesions Neurological: as above Psychiatric: No depression, +insomnia, no anxiety Endocrine: No palpitations, fatigue, diaphoresis, mood swings, change in appetite, change in weight, increased thirst Hematologic/Lymphatic:  No anemia, purpura, petechiae. Allergic/Immunologic: no itchy/runny eyes, nasal congestion, recent allergic reactions, rashes  PHYSICAL EXAM: Vitals:   08/29/16 1419  BP: 104/78  Pulse: 88   General: No acute distress Head:  Normocephalic/atraumatic Neck: supple, no paraspinal tenderness, full range of motion Heart:  Regular rate and rhythm Lungs:  Clear to auscultation bilaterally Back: No paraspinal tenderness Skin/Extremities: No rash, no edema Neurological Exam: alert and oriented to person, place, and time. No aphasia  or dysarthria. Fund of knowledge is appropriate.  Recent and remote memory are intact.  Attention and concentration are normal.    Able to name objects and repeat phrases. Cranial nerves: Pupils equal, round, reactive to light.  Fundoscopic exam unremarkable, no papilledema. Extraocular movements intact with no nystagmus. Visual fields full. Facial sensation intact. No facial asymmetry. Tongue, uvula, palate midline.  Motor: Bulk and tone normal, muscle strength 5/5 throughout with no pronator drift.  Sensation to light touch intact.  No extinction to double simultaneous stimulation.  Deep tendon reflexes 2+ throughout, toes downgoing.  Finger to nose testing intact.  Gait narrow-based and steady, able to tandem walk adequately.  Romberg negative.  IMPRESSION: This is a 41 yo RH woman with a history of headaches, dizziness, and syncope since 2009 or 2010, symptoms had quieted down, then recurred at the beginning of 2016. On review of records, her previous neurologist was concerned dizziness and vertigo were not physiologic in nature. She did not tolerate amitriptyline for headache prophylaxis, in addition, Cardiology felt this was causing SVT on holter monitor. Amitriptyline discontinued, she is now on Toprol for SVT. She was started on Zonisamide for headache prophylaxis, and reports bad headaches improved until she had another syncopal episode this month and hit her head. Headaches have recurred, she will increase Zonisamide dose to 400mg  qhs. It has not caused her enough drowsiness to maintain sleep, she will discuss insomnia with her PCP. She was given a prescription for prn Zomig for migraine rescue. Imitrex, Relpax, and Maxalt were ineffective in the past. She was instructed to let her cardiologist know about the recent syncopal episode. She knows to minimize rescue medication to 2-3 a week to avoid rebound headaches. She will keep a calendar of her headaches and follow-up in 4 months.   Thank you for  allowing me to participate in her care.  Please do not hesitate to call for any questions or concerns.  The duration of this appointment visit was 25 minutes of face-to-face time with the patient.  Greater than 50% of this time was spent in counseling, explanation of diagnosis, planning of further management, and coordination of care.   Patrcia Dolly, M.D.   CC: Harrietta Guardian FNP, Dr. Tenny Craw

## 2016-08-29 NOTE — Patient Instructions (Addendum)
1. Increase Zonisamide 100mg : take 4 capsules at night 2. Take Zomig 2.5mg  at onset of migraine. May take second dose after 2 hours. Do not take more than 2-3 a week. 3. Talk to your PCP about other medications for sleep 4. Call Dr. Tenny Craw about recent passing out episode 5. As per [place] driving laws, no driving until 6 months event-free 6. Follow-up in 4 months, call for any changes

## 2016-09-03 ENCOUNTER — Encounter: Payer: Self-pay | Admitting: Neurology

## 2016-09-22 ENCOUNTER — Telehealth: Payer: Self-pay | Admitting: Neurology

## 2016-09-22 NOTE — Telephone Encounter (Signed)
Called pt to relay message below.  Pt was not happy with the advice of increasing Zomig to 5mg .  States the instructions say not to take more that 3 tablets in a weeks time and then asked why should she take 2, if 1 doesn't help.  I told her that she may need a higher dosage.  She states that the medication is rather expensive and she "doesn't want to pay this much for something that doesn't work" and she "will be out of medication soon enough and won't be able to refill it".  I let her know that if she were to start taking 5mg , instead of 2.5mg  and saw that the higher dosage helps, we could adjust the prescriptions accordingly.  Pt was irate and kept repeating that Dr. Rosalyn Gess advice was contradicting to the medications instructions.  I tried again to explain, but she kept talking over me, more so to herself.  She would not let me get another word in edge wise.  I was unable to relay that she should speak with her PCP about her sleeping issues.

## 2016-09-22 NOTE — Telephone Encounter (Signed)
PT called and said she wanted to talk to Dr Aquino's nurse regarding her migraine

## 2016-09-22 NOTE — Telephone Encounter (Signed)
Returned pt call.  She states that she is now experiencing 1-2 migraines per week.  She takes Zomig 2.5mg  as soon as her migraine hits but says it is not working like she was expecting it to.  She is taking all other medications as directed.   She states she is trying to get better sleep, however she seems to only average 3 hours of sleep at a time.  She will wake up for 1-1.5 hours between each stretch of 3 hours of sleep and is "fully awake" after second stretch.  Let pt know that I would relay message to provider for advice.

## 2016-09-22 NOTE — Telephone Encounter (Signed)
Let's try increasing the Zomig dose to 5mg , same instructions. Please have her call her PCP about the sleep problem. Thanks

## 2016-12-07 NOTE — Progress Notes (Signed)
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

## 2016-12-08 DIAGNOSIS — N898 Other specified noninflammatory disorders of vagina: Secondary | ICD-10-CM | POA: Diagnosis not present

## 2016-12-08 DIAGNOSIS — B9689 Other specified bacterial agents as the cause of diseases classified elsewhere: Secondary | ICD-10-CM | POA: Diagnosis not present

## 2016-12-08 DIAGNOSIS — M7989 Other specified soft tissue disorders: Secondary | ICD-10-CM | POA: Diagnosis not present

## 2016-12-08 DIAGNOSIS — N760 Acute vaginitis: Secondary | ICD-10-CM | POA: Diagnosis not present

## 2016-12-08 DIAGNOSIS — Z8679 Personal history of other diseases of the circulatory system: Secondary | ICD-10-CM | POA: Diagnosis not present

## 2016-12-08 DIAGNOSIS — R252 Cramp and spasm: Secondary | ICD-10-CM | POA: Diagnosis not present

## 2017-01-01 ENCOUNTER — Ambulatory Visit: Payer: BLUE CROSS/BLUE SHIELD | Admitting: Neurology

## 2017-01-07 ENCOUNTER — Encounter: Payer: Self-pay | Admitting: Neurology

## 2017-01-07 ENCOUNTER — Ambulatory Visit (INDEPENDENT_AMBULATORY_CARE_PROVIDER_SITE_OTHER): Payer: BLUE CROSS/BLUE SHIELD | Admitting: Neurology

## 2017-01-07 VITALS — BP 104/72 | HR 70 | Ht 62.0 in | Wt 168.0 lb

## 2017-01-07 DIAGNOSIS — G4700 Insomnia, unspecified: Secondary | ICD-10-CM

## 2017-01-07 DIAGNOSIS — G43109 Migraine with aura, not intractable, without status migrainosus: Secondary | ICD-10-CM

## 2017-01-07 DIAGNOSIS — R55 Syncope and collapse: Secondary | ICD-10-CM | POA: Diagnosis not present

## 2017-01-07 MED ORDER — ZONISAMIDE 100 MG PO CAPS: ORAL_CAPSULE | ORAL | 3 refills | Status: DC

## 2017-01-07 MED ORDER — VENLAFAXINE HCL ER 37.5 MG PO CP24: ORAL_CAPSULE | ORAL | 6 refills | Status: DC

## 2017-01-07 MED ORDER — TRAMADOL HCL 50 MG PO TABS: ORAL_TABLET | ORAL | 5 refills | Status: DC

## 2017-01-07 NOTE — Patient Instructions (Signed)
1. Start Venlafaxine ER (Effexor XR) 37.5mg : Take 1 capsule at night for 1 month, then increase to 2 capsules at night 2. Take Tramadol 50mg : start with 1/2 tablet for severe headaches. Do not take more than 2-3 times a week 3. Keep a calendar of headaches, follow-up in 4 months, call for any changes

## 2017-01-07 NOTE — Progress Notes (Signed)
NEUROLOGY FOLLOW UP OFFICE NOTE  Tasha Ross [id number]  HISTORY OF PRESENT ILLNESS: I had the pleasure of seeing Tasha Ross in follow-up in the neurology clinic on 01/07/2017.  The patient was last seen 4 months ago for vertigo, headaches, and syncope. MRI brain unremarkable. She had an abnormal echo and was referred to Cardiology, who did a holter monitor with note of tachycardia with heart rates up to 180-190 bpm. It was felt that amitriptyline was the potential cause. 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 She denies any further syncopal episodes since April 2018, she still feels lightheaded, and would usually take a small packet of salt and gatorade and feel better. She continues to deal with insomnia and will be seeing her new PCP next week. She tried Trazodone in the past. She gets only 4 hours of sleep, she has difficulties with sleep initiation and maintenance, she has been up today since 3AM. This is affecting her ability to focus as well. She continues to report migraines on Zonisamide 400mg  qhs. She has headaches around once a week (around 6 headaches a month), with associated nausea and sensitivity to sounds. She had to take off work for 2 days this week. Zomig did not help and was cost-prohibitive.   HPI 01/17/2015: This is a 41 yo RH woman presenting with vertigo, headaches, and episodes of passing out that started in 2009 or 2010. She had seen neurologist Dr. Shane Ross in 2012 while living in [place], reporting that she is having the exact same symptoms she had, they had gotten under control while she was seeing him, with no episodes for a year, until January 2016 as she was transitioning for her move to [facility] last March 2016. She reports migraines followed a couple of days later by vertigo. She would brace herself, trying to sit still, but would lose consciousness. She has  fallen to the floor at work. Sometimes she can brace herself, other times there is no warning. Symptoms are brief, she would wake up on the floor. She was admitted to [facility] last 10/22/14 for near syncope, she reported right-sided weakness with slurred speech, that improved during transport. Glucose noted to be 72. She had an MRI brain without contrast with no acute intracranial changes. MRA brain reported a 1.55mm bulbous appearance of the distal left PICA branches in the region of the inferior cerebellar vermis. It may represent a vascular loop, however a small aneurysm is not entirely excluded. 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 Records from her previous neurologist were reviewed. He was concerned that dizziness and vertigo were not physiological in nature. He had done an MRI/MRA brain reported as normal as well. TSH, RPR, CBC normal. B12 low normal at 278, she had received replacement treatment. For migraine prophylaxis, she was started on amitriptyline in 2012, then switched to Topamax in 2013. She reports migraines are retro-orbital, sometimes dull or sharp pain, with phonophobia, nausea. She recalls taking amitriptyline and Topamax, and states these were stopped because the episodes stopped. She recalls taking Imitrex, Fioricet, Maxalt which would help initially. She feels the amitriptyline and citalopram in the past helped put her in a calm mode. She has been having more frequent migraines occurring twice a week, lasting 3-4 hours. Ibuprofen does not help. The vertigo occurs a couple of days after a migraine, with spinning sensation lasting up to a week. She  usually gets less than 6 hours of sleep, feeling "real agitated." She reports the last syncopal episode was in June. When asked about stress, she does indicate that she has been working for [employer] since 2014, but had to move to a different location with her move in January.   She had a  syncopal episode at work last 08/19/16 and has had a headache, neck pain, and seeing floaters since then. 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 No convulsive activity seen on video. She came to and recalls feeling sick to her stomach and unable to talk, with her words not coming out clear. She reports another syncopal episode in March on the way to work, she felt a pressure in her chest. She was recently treated with antibiotic for congestion, but has started feeling ill again. She wonders if weather changes are a trigger, she has become more hypersensitive to smells since Spring started.   Prior preventative medications: amitriptyline, topamax, Toprol, zonisamide, citalopram Prior rescue medications: imitrex, relpax, maxalt, zomig, fioricet  PAST MEDICAL HISTORY: Past Medical History:  Diagnosis Date  . Cardiomyopathy (HCC)    a. Echo 01/2015: EF 45-50% with diffuse HK.  Marland Kitchen History of stress test    a. ETT 11/16:  normal  . Migraine   . Near syncope   . Snoring 04/06/2015  . SVT (supraventricular tachycardia) (HCC)    a. Seen on event monitor 01/2015.  Marland Kitchen Vertigo     MEDICATIONS: Current Outpatient Prescriptions on File Prior to Visit  Medication Sig Dispense Refill  . metoprolol succinate (TOPROL-XL) 25 MG 24 hr tablet Take 0.5 tablets (12.5 mg total) by mouth daily. 45 tablet 2  . zolmitriptan (ZOMIG-ZMT) 2.5 MG disintegrating tablet Take 1 tablet (2.5 mg total) by mouth as needed for migraine. Do not take more than 2-3 a week 10 tablet 5  . zonisamide (ZONEGRAN) 100 MG capsule Take 4 capsule at night. 120 capsule 6  . azelastine (ASTELIN) 0.1 % nasal spray Place 2 sprays into both nostrils daily.  11  . fluticasone (FLONASE) 50 MCG/ACT nasal spray Place 2 sprays into both nostrils daily.     No current facility-administered medications on file prior to visit.     ALLERGIES: Allergies    Allergen Reactions  . Ibuprofen Other (See Comments) and Rash    Per Neurologist request Per Neurologist request  . Amitriptyline Other (See Comments) and Rash    Causing headaches and head pains Causing headaches and head pains  . Clindamycin Rash  . Sulfa Antibiotics Swelling  . Sulfur Swelling  . Sulfamethoxazole Swelling    FAMILY HISTORY: Family History  Problem Relation Age of Onset  . Cancer Father   . Hypertension Unknown   . Lupus Unknown   . Diabetes Unknown   . Heart disease Maternal Aunt        Patient does not know details  . Stroke Neg Hx   . Fainting Neg Hx   . Migraines Neg Hx   . Seizures Neg Hx     SOCIAL HISTORY: Social History   Social History  . Marital status: Single    Spouse name: N/A  . Number of children: 4  . Years of education: N/A   Occupational History  . Not on file.   Social History Main Topics  . Smoking status: Never Smoker  . Smokeless tobacco: Never Used  .  Alcohol use 0.0 oz/week     Comment: Rare - once or twice a month.  . Drug use: No  . Sexual activity: Not on file   Other Topics Concern  . Not on file   Social History Narrative   Lives with 3 of her sons.  Has 4 sons.  Works for [employer].  Education: some college.       REVIEW OF SYSTEMS: Constitutional: No fevers, chills, or sweats, no generalized fatigue, change in appetite Eyes: No visual changes, double vision, eye pain Ear, nose and throat: No hearing loss, ear pain, nasal congestion, sore throat Cardiovascular: No chest pain, palpitations Respiratory:  No shortness of breath at rest or with exertion, wheezes GastrointestinaI: No nausea, vomiting, diarrhea, abdominal pain, fecal incontinence Genitourinary:  No dysuria, urinary retention or frequency Musculoskeletal:  No neck pain, +back pain Integumentary: No rash, pruritus, skin lesions Neurological: as above Psychiatric: No depression, +insomnia, no anxiety Endocrine: No palpitations, fatigue,  diaphoresis, mood swings, change in appetite, change in weight, increased thirst Hematologic/Lymphatic:  No anemia, purpura, petechiae. Allergic/Immunologic: no itchy/runny eyes, nasal congestion, recent allergic reactions, rashes  PHYSICAL EXAM: Vitals:   01/07/17 1019  BP: 104/72  Pulse: 70  SpO2: 99%   General: No acute distress Head:  Normocephalic/atraumatic Neck: supple, no paraspinal tenderness, full range of motion Heart:  Regular rate and rhythm Lungs:  Clear to auscultation bilaterally Back: No paraspinal tenderness Skin/Extremities: No rash, no edema Neurological Exam: alert and oriented to person, place, and time. No aphasia or dysarthria. Fund of knowledge is appropriate.  Recent and remote memory are intact.  Attention and concentration are normal.    Able to name objects and repeat phrases. Cranial nerves: Pupils equal, round, reactive to light.  Fundoscopic exam unremarkable, no papilledema. Extraocular movements intact with no nystagmus. Visual fields full. Facial sensation intact. No facial asymmetry. Tongue, uvula, palate midline.  Motor: Bulk and tone normal, muscle strength 5/5 throughout with no pronator drift.  Sensation to light touch intact.  No extinction to double simultaneous stimulation.  Deep tendon reflexes 2+ throughout, toes downgoing.  Finger to nose testing intact.  Gait narrow-based and steady, able to tandem walk adequately.  Romberg negative.  IMPRESSION: This is a 40 yo RH woman with a history of headaches, dizziness, and syncope since 2009 or 2010, symptoms had quieted down, then recurred at the beginning of 2016. On review of records, her previous neurologist was concerned dizziness and vertigo were not physiologic in nature. She did not tolerate amitriptyline for headache prophylaxis, in addition, Cardiology felt this was causing SVT on holter monitor. Amitriptyline discontinued, she is now on Toprol for SVT. She continues to report around 6 headaches a  month on Zonisamide 400mg  qhs. We will add on Venlafaxine ER 37.5mg  qhs x 1 month, then increase to 2 caps qhs for headache prophylaxis. Side effects were discussed. She has tried multiple triptans with no effect, and was given a prescription for Tramadol 50mg  prn for severe headaches, take only 2-3 times a week, to avoid rebound headaches. Side effects discussed. She will keep a calendar of her headaches and follow-up in 4 months.   Thank you for allowing me to participate in her care.  Please do not hesitate to call for any questions or concerns.  The duration of this appointment visit was 25 minutes of face-to-face time with the patient.  Greater than 50% of this time was spent in counseling, explanation of diagnosis, planning of further management, and coordination  of care.   Patrcia Dolly, M.D.   CC: Harrietta Guardian FNP, Dr. Tenny Craw

## 2017-01-12 ENCOUNTER — Telehealth: Payer: Self-pay | Admitting: Internal Medicine

## 2017-01-12 ENCOUNTER — Other Ambulatory Visit: Payer: Self-pay

## 2017-01-12 ENCOUNTER — Emergency Department (HOSPITAL_COMMUNITY): Payer: BLUE CROSS/BLUE SHIELD

## 2017-01-12 ENCOUNTER — Emergency Department (HOSPITAL_COMMUNITY)
Admission: EM | Admit: 2017-01-12 | Discharge: 2017-01-12 | Disposition: A | Discharge status: Home / Self Care | Payer: BLUE CROSS/BLUE SHIELD | Attending: Emergency Medicine | Admitting: Emergency Medicine

## 2017-01-12 ENCOUNTER — Encounter (HOSPITAL_COMMUNITY): Payer: Self-pay | Admitting: Emergency Medicine

## 2017-01-12 DIAGNOSIS — Z79899 Other long term (current) drug therapy: Secondary | ICD-10-CM | POA: Insufficient documentation

## 2017-01-12 DIAGNOSIS — R42 Dizziness and giddiness: Secondary | ICD-10-CM | POA: Diagnosis not present

## 2017-01-12 DIAGNOSIS — R079 Chest pain, unspecified: Secondary | ICD-10-CM | POA: Diagnosis not present

## 2017-01-12 DIAGNOSIS — R110 Nausea: Secondary | ICD-10-CM | POA: Diagnosis not present

## 2017-01-12 LAB — CBC
HCT: 36.4 % (ref 36.0–46.0)
Hemoglobin: 11.8 g/dL — ABNORMAL LOW (ref 12.0–15.0)
MCH: 27.3 pg (ref 26.0–34.0)
MCHC: 32.4 g/dL (ref 30.0–36.0)
MCV: 84.1 fL (ref 78.0–100.0)
Platelets: 316 10*3/uL (ref 150–400)
RBC: 4.33 MIL/uL (ref 3.87–5.11)
RDW: 13 % (ref 11.5–15.5)
WBC: 6.2 10*3/uL (ref 4.0–10.5)

## 2017-01-12 LAB — BASIC METABOLIC PANEL
Anion gap: 7 (ref 5–15)
BUN: <5 mg/dL — ABNORMAL LOW (ref 6–20)
CO2: 25 mmol/L (ref 22–32)
Calcium: 8.5 mg/dL — ABNORMAL LOW (ref 8.9–10.3)
Chloride: 107 mmol/L (ref 101–111)
Creatinine, Ser: 0.65 mg/dL (ref 0.44–1.00)
GFR calc Af Amer: >60 mL/min (ref >60)
GFR calc non Af Amer: >60 mL/min (ref >60)
Glucose, Bld: 89 mg/dL (ref 65–99)
Potassium: 3.1 mmol/L — ABNORMAL LOW (ref 3.5–5.1)
Sodium: 139 mmol/L (ref 135–145)

## 2017-01-12 LAB — I-STAT TROPONIN, ED: Troponin i, poc: 0 ng/mL (ref 0.00–0.08)

## 2017-01-12 LAB — TROPONIN I: Troponin I: <0.03 ng/mL (ref <0.03)

## 2017-01-12 MED ORDER — OXYCODONE-ACETAMINOPHEN 5-325 MG PO TABS
1.0000 | ORAL_TABLET | Freq: Once | ORAL | Status: AC
  Administered 2017-01-12: 1 via ORAL
  Filled 2017-01-12: qty 1

## 2017-01-12 MED ORDER — HYDROCODONE-ACETAMINOPHEN 5-325 MG PO TABS: 1.0000 | ORAL_TABLET | ORAL | 0 refills | Status: DC | PRN

## 2017-01-12 NOTE — Telephone Encounter (Signed)
New message    Pt c/o of Chest Pain: STAT if CP now or developed within 24 hours  1. Are you having CP right now? Yes, since 630.  2. Are you experiencing any other symptoms (ex. SOB, nausea, vomiting, sweating)? Pt states she almost passed out yesterday, and couldn't talk for 5 minutes.   3. How long have you been experiencing CP? Since yesterday  4. Is your CP continuous or coming and going? Coming and going  5. Have you taken Nitroglycerin? No  ?

## 2017-01-12 NOTE — ED Notes (Signed)
Pt made aware of bed status

## 2017-01-12 NOTE — ED Triage Notes (Signed)
Pt reports yesterday while at work began not feeling well. Was dizzy, nauseated, near syncopal. Left work was able to eat some dinner. Woke this morning with substernal chest pain. Speaking in full sentences. VSS. NAD at present.

## 2017-01-12 NOTE — ED Provider Notes (Signed)
[facility]EMER[facility]ENCY DEPT Provider Note   CSN: [id number] Arrival date & time: 01/12/17  1024     History   Chief Complaint Chief Complaint  Patient presents with  . Chest Pain  . Near Syncope    HPI Tasha Ross is a 41 y.o. female.  HPI   41 year old female multiple complaints. She reports that yesterday at work she was not feeling well and suddenly "everything locked up." She states that she was unable to talk. She says that she just could not form any words. She was able to write down what she wanted to say though at that time. Any acute pain. She did feel dizzy and nauseated though. She felt as if she may pass out but did not actually do so. She felt better after leaving work yesterday evening and was able to eat some dinner. Around 6:30 this morning she woke up with substernal chest pain which has been persistent since then. No dyspnea. No cough. No fevers or chills. No palpitations.  Past Medical History:  Diagnosis Date  . Cardiomyopathy (HCC)    a. Echo 01/2015: EF 45-50% with diffuse HK.  Marland Kitchen History of stress test    a. ETT 11/16:  normal  . Migraine   . Near syncope   . Snoring 04/06/2015  . SVT (supraventricular tachycardia) (HCC)    a. Seen on event monitor 01/2015.  Marland Kitchen Vertigo     Patient Active Problem List   Diagnosis Date Noted  . Insomnia 04/19/2015  . Snoring 04/06/2015  . Cardiomyopathy (HCC)   . Head revolving around 02/06/2015  . Complicated migraine 01/17/2015  . Faintness 01/17/2015    Past Surgical History:  Procedure Laterality Date  . CYSTECTOMY     from ovaries  . TUBAL LIGATION Bilateral   . VAGINAL HYSTERECTOMY      OB History    No data available       Home Medications    Prior to Admission medications   Medication Sig Start Date End Date Taking? Authorizing Provider  azelastine (ASTELIN) 0.1 % nasal spray Place 2 sprays into both nostrils daily. 05/31/16   [provider]  fluticasone (FLONASE) 50 MCG/ACT nasal spray  Place 2 sprays into both nostrils daily.    [provider]  metoprolol succinate (TOPROL-XL) 25 MG 24 hr tablet Take 0.5 tablets (12.5 mg total) by mouth daily. 05/07/16   Pricilla Riffle, MD  traMADol [facility]) 50 MG tablet Take 1/2 to 1 tablet as needed for severe headache. Do not take more than 2-3 times a week 01/07/17   Van Clines, MD  venlafaxine XR (EFFEXOR-XR) 37.5 MG 24 hr capsule Take 1 capsule at night for 1 month, then increase to 2 capsules at night 01/07/17   Van Clines, MD  zonisamide [facility]) 100 MG capsule Take 4 capsule at night. 01/07/17   Van Clines, MD    Family History Family History  Problem Relation Age of Onset  . Cancer Father   . Hypertension Unknown   . Lupus Unknown   . Diabetes Unknown   . Heart disease Maternal Aunt        Patient does not know details  . Stroke Neg Hx   . Fainting Neg Hx   . Migraines Neg Hx   . Seizures Neg Hx     Social History Social History  Substance Use Topics  . Smoking status: Never Smoker  . Smokeless tobacco: Never Used  . Alcohol use 0.0 oz/week  Comment: Rare - once or twice a month.     Allergies   Ibuprofen; Amitriptyline; Clindamycin; Sulfa antibiotics; Sulfur; and Sulfamethoxazole   Review of Systems Review of Systems  All systems reviewed and negative, other than as noted in HPI.  Physical Exam Updated Vital Signs BP 122/75 (BP Location: Left Arm)   Pulse 94   Temp 98.2 F (36.8 C) (Oral)   Resp 20   Ht  (1.575 m)   Wt 76.2 kg (168 lb)   SpO2 99%   BMI 30.73 kg/m   Physical Exam  Constitutional: She appears well-developed and well-nourished. No distress.  HENT:  Head: Normocephalic and atraumatic.  Eyes: Conjunctivae are normal. Right eye exhibits no discharge. Left eye exhibits no discharge.  Neck: Neck supple.  Cardiovascular: Normal rate, regular rhythm and normal heart sounds.  Exam reveals no gallop and no friction rub.   No murmur heard. Pulmonary/Chest: Effort  normal and breath sounds normal. No respiratory distress.  Abdominal: Soft. She exhibits no distension. There is no tenderness.  Musculoskeletal: She exhibits no edema or tenderness.  Lower extremities symmetric as compared to each other. No calf tenderness. Negative Homan's. No palpable cords.   Neurological: She is alert.  Skin: Skin is warm and dry.  Psychiatric: She has a normal mood and affect. Her behavior is normal. Thought content normal.  Nursing note and vitals reviewed.    ED Treatments / Results  Labs (all labs ordered are listed, but only abnormal results are displayed) Labs Reviewed  BASIC METABOLIC PANEL - Abnormal; Notable for the following:       Result Value   Potassium 3.1 (*)    BUN <5 (*)    Calcium 8.5 (*)    All other components within normal limits  CBC - Abnormal; Notable for the following:    Hemoglobin 11.8 (*)    All other components within normal limits  I-STAT TROPONIN, ED    EKG  EKG Interpretation  Date/Time:  Monday January 12 2017 18:11:13 EDT Ventricular Rate:  58 PR Interval:  140 QRS Duration: 78 QT Interval:  424 QTC Calculation: 416 R Axis:   72 Text Interpretation:  Sinus bradycardia with sinus arrhythmia Otherwise normal ECG No significant change since last tracing Confirmed by Richardean Canal [id number]) on 01/14/2017 11:04:28 AM       Radiology Dg Chest 2 View  Result Date: 01/12/2017 CLINICAL DATA:  Became dizzy, nauseated and near syncopal at work yesterday, substernal chest pain this morning, history of cardiomyopathy EXAM: CHEST  2 VIEW COMPARISON:  07/04/2016 FINDINGS: Normal heart size, mediastinal contours, and pulmonary vascularity. Lungs clear. No pleural effusion or pneumothorax. Bones unremarkable. IMPRESSION: Normal exam. Electronically Signed   By: Ulyses Southward M.D.   On: 01/12/2017 11:33    Procedures Procedures (including critical care time)  Medications Ordered in ED Medications - No data to display   Initial  Impression / Assessment and Plan / ED Course  I have reviewed the triage vital signs and the nursing notes.  Pertinent labs & imaging results that were available during my care of the patient were reviewed by me and considered in my medical decision making (see chart for details).     41yF with CP. Atypical for ACS. Repeat troponin is normal with symptoms onset around 0630 today. Doubt PE, dissection or other emergent process.   Final Clinical Impressions(s) / ED Diagnoses   Final diagnoses:  Chest pain, unspecified type    New Prescriptions New  Prescriptions   No medications on file     Raeford Razor, MD 01/30/17 1218

## 2017-01-12 NOTE — Telephone Encounter (Signed)
Patient reports CP "the worst she's ever had" since yesterday. It started at work yesterday. She got terrible chest pressure, weakness and nausea.  Her coworker got her a Gatorade to try to help. The symptoms did not subside. She also had a 5 minute period where she was unable to talk. She left work and went home. The CP subsided and she went to bed.  The chest pressure woke her up at 0630 this AM and has persisted to current time. She denies SOB and radiating pain, but is extremely nauseous.  Nothing makes the CP better or worse and it is an extreme, constant pressure. Instructed patient to proceed to the ED for evaluation.  She was grateful for call and agrees with treatment plan.

## 2017-01-19 ENCOUNTER — Ambulatory Visit (INDEPENDENT_AMBULATORY_CARE_PROVIDER_SITE_OTHER): Payer: BLUE CROSS/BLUE SHIELD | Admitting: Internal Medicine

## 2017-01-19 ENCOUNTER — Encounter: Payer: Self-pay | Admitting: Internal Medicine

## 2017-01-19 VITALS — BP 124/70 | HR 86 | Ht 62.0 in | Wt 162.8 lb

## 2017-01-19 DIAGNOSIS — I951 Orthostatic hypotension: Secondary | ICD-10-CM | POA: Diagnosis not present

## 2017-01-19 DIAGNOSIS — R079 Chest pain, unspecified: Secondary | ICD-10-CM | POA: Diagnosis not present

## 2017-01-19 DIAGNOSIS — I428 Other cardiomyopathies: Secondary | ICD-10-CM

## 2017-01-19 DIAGNOSIS — R000 Tachycardia, unspecified: Secondary | ICD-10-CM | POA: Diagnosis not present

## 2017-01-19 DIAGNOSIS — G90A Postural orthostatic tachycardia syndrome (POTS): Secondary | ICD-10-CM

## 2017-01-19 IMAGING — MR MR CARD MORPHOLOGY WO/W CM
10 of 11 series · 15 of 16 positions shown · IV contrast (20    MH)
Comparison: none

CLINICAL DATA: 39-year-old female with cardiomyopathy of unknown
etiology.

EXAM:
CARDIAC MRI
TECHNIQUE: The patient was scanned on a 1.5 Tesla GE magnet. A dedicated
cardiac coil was used. Functional imaging was done using Fiesta
sequences. [DATE], and 4 chamber views were done to assess for RWMA's.
Modified Selmakie rule using a short axis stack was used to
calculate an ejection fraction on a dedicated work station using
Circle software. The patient received 20 cc of Multihance. After 10
minutes inversion recovery sequences were used to assess for
infiltration and scar tissue.
CONTRAST:  20 cc  of Multihance

[Series 3: bSSFP · sagittal · 8.0mm · 1.25mm/px · 1 of 14 slices shown (1 of 4)]
[im 1/14]
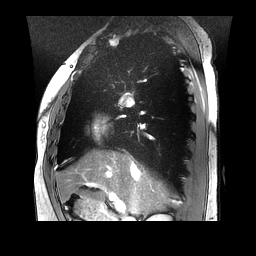

[Series 4: bSSFP · axial · 8.0mm · 1.25mm/px · 1 of 20 slices shown (2 of 4)]
[im 1/20]
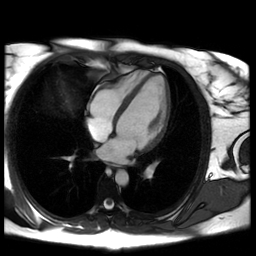

[Series 5: bSSFP · oblique · 8.0mm · 1.37mm/px · 6 of 300 slices shown (3 of 4)]
[im 1/300]
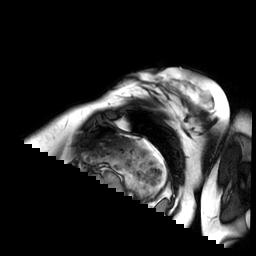
[im 60/300]
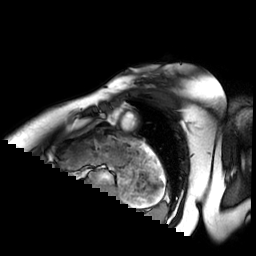
[im 120/300]
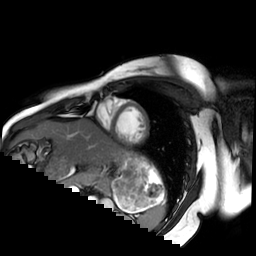
[im 180/300]
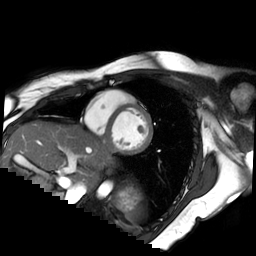
[im 240/300]
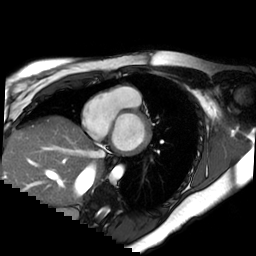
[im 300/300]
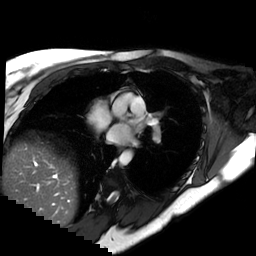

[Series 6: T2 · oblique · 8.0mm · 1.37mm/px · 1 of 60 slices shown (1 of 2)]
[im 1/60]
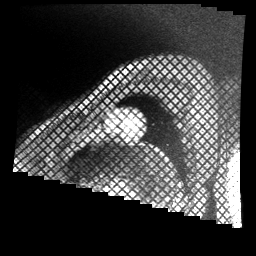

[Series 7: T2 · axial · 8.0mm · 1.37mm/px · 1 of 60 slices shown (2 of 2)]
[im 1/60]
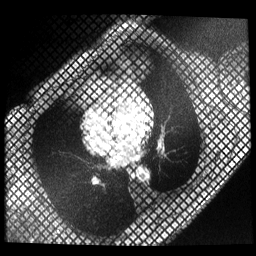

[Series 8: bSSFP · axial · 8.0mm · 1.37mm/px · 1 of 60 slices shown (4 of 4)]
[im 1/60]
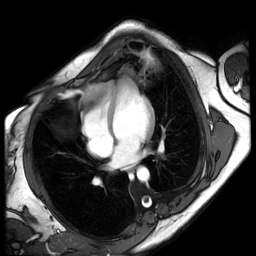

[Series 9: cine ir · oblique · 8.0mm · 1.37mm/px · 1 of 30 slices shown]
[im 1/30]
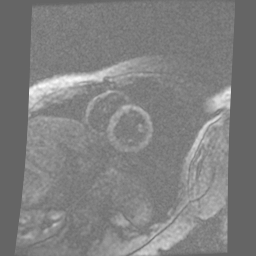

[Series 12: delayed ir prep · oblique · 8.0mm · 1.21mm/px · 1 of 15 slices shown (1 of 2)]
[im 1/15]
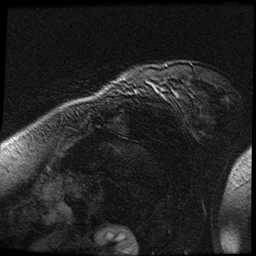

[Series 13: delayed ir prep · oblique · 8.0mm · 1.21mm/px · 1 of 6 slices shown (2 of 2)]
[im 1/6]
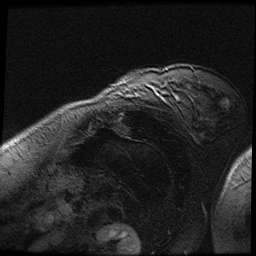

[Series 14: rad delayed ir · axial · 8.0mm · 1.37mm/px · 1 of 3 slices shown]
[im 1/3]
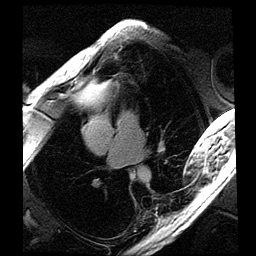

[15 of 16 positions shown; findings below may reference images not displayed]

FINDINGS: 1. Mild left ventricular dilatation with normal wall thickness and
normal systolic function (LVEF = 55%).

There are no regional wall motion abnormalities.

There are pronounced trabeculations in the apical and mid segments
with compacted to non-compacted ratio > [DATE] when measured in long
axis views.

LVEDD:  55 mm

LVESD:  36 mm

LVEDV:  131 ml

LVESV:  59 ml

SV:  73 ml

CO:  5.8 L/minute

2. Normal right ventricular size, thickness and systolic function
(LVEF = 53%) with no regional wall motion abnormalities.

3.  Normal biatrial size.

4. Normal size of the aortic root, ascending aorta and pulmonary
artery.

5.  Mild mitral and trivial tricuspid regurgitation.

6.  No late gadolinium enhancement in the LV and RV myocardium.

7.  No pericardial effusion.
IMPRESSION: 1. Mild left ventricular dilatation with normal wall thickness and
normal systolic function (LVEF = 55%) with no regional wall motion
abnormalities.

There are pronounced trabeculations in the apical and mid segments
with compacted to non-compacted ratio > [DATE] when measured in long
axis views.

2. Normal right ventricular size, thickness and systolic function
(LVEF = 53%) with no regional wall motion abnormalities.

3.  Mild mitral and trivial tricuspid regurgitation.

Collectively, these findings are consistent with non-compaction
cardiomyopathy (a form of non-ischemic cardiomyopathy). A standard
CHF therapy is recommended.

J[place] Nation

## 2017-01-19 NOTE — Progress Notes (Signed)
Cardiology Office Note   Date:  01/19/2017   ID:  ORI EHRESMAN, DOB 08-31-1975, MRN [id number]  PCP:  Dolan Amen, FNP  Cardiologist:   Dietrich Pates, MD    Pt presents for f/u of CP     History of Present Illness: Tasha Ross is a 41 y.o. female with a history of dizziness, sinus tach.  LVEF on echo was 53% with evid of noncompaction.  I saw her in Oct 2017  An ARB was added to regimn   MRI LVEF 55%  Normal GXT in Nov 2017   Pt seen in ED for CP in Feb  Seen by Darrel Hoover in March   I saw her after   She was seen in ED again on 9/10 for feeling sensation of "everything locked uP"  Unable to talk, form words  No Pain  Did feel dizzy  Left work  Ate dinner Woke up at 6:30 with CP   Persistent   Pt says now had CP  Couldn't talk  Lasted 5 min  Then fine  Then couldn't walk  Dizzy  Then Got better  Went home  Woke up with CP  Like punched in chest   Not pleuritic  No acid reflux    Current Meds  Medication Sig  . azelastine (ASTELIN) 0.1 % nasal spray Place 2 sprays into both nostrils daily.  . fluticasone (FLONASE) 50 MCG/ACT nasal spray Place 2 sprays into both nostrils daily.  Marland Kitchen losartan (COZAAR) 25 MG tablet Take 12.5 mg by mouth daily.   . metoprolol succinate (TOPROL-XL) 25 MG 24 hr tablet Take 0.5 tablets (12.5 mg total) by mouth daily.  . traMADol (ULTRAM) 50 MG tablet Take 1/2 to 1 tablet as needed for severe headache. Do not take more than 2-3 times a week  . venlafaxine XR (EFFEXOR-XR) 37.5 MG 24 hr capsule Take 1 capsule at night for 1 month, then increase to 2 capsules at night  . zonisamide (ZONEGRAN) 100 MG capsule Take 4 capsule at night.     Allergies:   Ibuprofen; Amitriptyline; Clindamycin; Sulfa antibiotics; Sulfur; and Sulfamethoxazole   Past Medical History:  Diagnosis Date  . Cardiomyopathy (HCC)    a. Echo 01/2015: EF 45-50% with diffuse HK.  Marland Kitchen History of stress test    a. ETT 11/16:  normal  . Migraine   . Near syncope   . Snoring  04/06/2015  . SVT (supraventricular tachycardia) (HCC)    a. Seen on event monitor 01/2015.  Marland Kitchen Vertigo     Past Surgical History:  Procedure Laterality Date  . CYSTECTOMY     from ovaries  . TUBAL LIGATION Bilateral   . VAGINAL HYSTERECTOMY       Social History:  The patient  reports that she has never smoked. She has never used smokeless tobacco. She reports that she drinks alcohol. She reports that she does not use drugs.   Family History:  The patient's family history includes Cancer in her father; Diabetes in her unknown relative; Heart disease in her maternal aunt; Hypertension in her unknown relative; Lupus in her unknown relative.    ROS:  Please see the history of present illness. All other systems are reviewed and  Negative to the above problem except as noted.    PHYSICAL EXAM: VS:  BP 124/70   Pulse 86   Ht 5\' 2"  (1.575 m)   Wt 162 lb 12.8 oz (73.8 kg)   SpO2 96%  BMI 29.78 kg/m   GEN: Well nourished, well developed, in no acute distress  HEENT: normal  Neck: no JVD, carotid bruits, or masses Cardiac: RRR  No murmurs  rubs, or gallops,no edema  Respiratory:  clear to auscultation bilaterally, normal work of breathing GI: soft, nontender, nondistended, + BS  No hepatomegaly  MS: no deformity Moving all extremities   Skin: warm and dry, no rash Neuro:  Strength and sensation are intact Psych: euthymic mood, full affect   EKG:  EKG is not ordered today.   Lipid Panel    Component Value Date/Time   CHOL 147 09/28/2015 1217   TRIG 48 09/28/2015 1217   HDL 61 09/28/2015 1217   CHOLHDL 2.4 09/28/2015 1217   VLDL 10 09/28/2015 1217   LDLCALC 76 09/28/2015 1217      Wt Readings from Last 3 Encounters:  01/19/17 162 lb 12.8 oz (73.8 kg)  01/12/17 168 lb (76.2 kg)  01/07/17 168 lb (76.2 kg)      ASSESSMENT AND PLAN:  1  CP  Denies   CP atypical  Not cardiac  On exam today she has signif increase in HR  BP stays same, except for increase in diastolic    ? Intercostal pain with POTS   I encouraged her to increase fluids, salt  Will check UA for SG  Check 24 hour urnie NA  Repeat BMET and check cortisol    Stop cozaar 2  noncompaction cardiomyopathy  LVEF 55%  Currently on Cozaar Stop for now   3   CT  Evid of possible adrenal adenoma  Will need to be followed.      Current medicines are reviewed at length with the patient today.  The patient does not have concerns regarding medicines.  Signed, Dietrich Pates, MD  01/19/2017 3:46 PM    [facility] [address] [place], [place], [place]  [address] Phone: [phone]; Fax: [phone]

## 2017-01-19 NOTE — Patient Instructions (Addendum)
Your physician has recommended you make the following change in your medication:  1. Stop Losartan  Your physician recommends that you return for lab work in: today (TSH, CORTISOL, BMET, UA  ----24 HR URINE COLLECTION----OBTAIN COLLECTION CONTAINER FROM [facility] ON FIRST FLOOR  Your physician recommends that you schedule a follow-up appointment in: 6 WEEKS WITH DR. Tenny Craw.   INCREASE FLUIDS AND SALT INTAKE

## 2017-01-20 LAB — URINALYSIS
BILIRUBIN UA: NEGATIVE
GLUCOSE, UA: NEGATIVE
KETONES UA: NEGATIVE
Leukocytes, UA: NEGATIVE
NITRITE UA: NEGATIVE
Protein, UA: NEGATIVE
RBC, UA: NEGATIVE
Specific Gravity, UA: 1.012 (ref 1.005–1.030)
UUROB: 0.2 mg/dL (ref 0.2–1.0)
pH, UA: 5 (ref 5.0–7.5)

## 2017-01-20 LAB — SPECIMEN STATUS REPORT

## 2017-01-21 ENCOUNTER — Telehealth: Payer: Self-pay | Admitting: Internal Medicine

## 2017-01-21 LAB — BASIC METABOLIC PANEL
BUN/Creatinine Ratio: 12 (ref 9–23)
BUN: 9 mg/dL (ref 6–24)
CALCIUM: 9.1 mg/dL (ref 8.7–10.2)
CO2: 19 mmol/L — AB (ref 20–29)
Chloride: 106 mmol/L (ref 96–106)
Creatinine, Ser: 0.77 mg/dL (ref 0.57–1.00)
GFR, EST AFRICAN AMERICAN: 112 mL/min/{1.73_m2} (ref >59)
GFR, EST NON AFRICAN AMERICAN: 97 mL/min/{1.73_m2} (ref >59)
Glucose: 83 mg/dL (ref 65–99)
POTASSIUM: 4.1 mmol/L (ref 3.5–5.2)
Sodium: 138 mmol/L (ref 134–144)

## 2017-01-21 LAB — SPECIMEN STATUS REPORT

## 2017-01-21 LAB — CORTISOL: Cortisol: 4.2 ug/dL

## 2017-01-21 LAB — TSH: TSH: 0.802 u[IU]/mL (ref 0.450–4.500)

## 2017-01-21 NOTE — Telephone Encounter (Signed)
New message    Pt would like to speak with Dr Tenny Craw about following up from Monday appt, how much salt intake do you want her to consume?

## 2017-01-21 NOTE — Telephone Encounter (Signed)
Pt advised to liberalize salt in her diet, drink Gatorade/sports drinks, liberalize food containing salt/ add salt to foods.

## 2017-01-30 ENCOUNTER — Telehealth: Payer: Self-pay | Admitting: Internal Medicine

## 2017-01-30 NOTE — Telephone Encounter (Signed)
New message      Pt had an "episode" at work on sept 9th and went to the ER on sept 10th.   Pt saw Dr Tenny Craw on 01-19-17.  Her supervisor just heard about the episode at work and is now requesting a return to work note in order for pt to come back to work.  She had been working approx 2.5 weeks since the ER visit but now she cannot return until she brings a note from her doctor.  Please call pt when note is ready and she will come pick up the note.

## 2017-02-02 ENCOUNTER — Encounter: Payer: Self-pay | Admitting: *Deleted

## 2017-02-02 NOTE — Telephone Encounter (Signed)
Pt was in ER on 01/12/17. Saw Dr. Tenny Craw 01/19/17.  According to notes below-  worked after ER visit. Was not placed out of work by Dr. Tenny Craw. Since her work found out she was in the ER, they do not permit return without note from cardiology. I placed letter at front desk for pick up. Left voicemail for patient stating letter is available for p/u. Will route to Dr. Tenny Craw for review/recommendations.

## 2017-02-02 NOTE — Telephone Encounter (Signed)
Follow up     Second request to get note to return back to work please call . Thank you

## 2017-02-03 NOTE — Telephone Encounter (Signed)
OK to return to work 

## 2017-02-04 ENCOUNTER — Ambulatory Visit: Payer: BLUE CROSS/BLUE SHIELD | Admitting: Neurology

## 2017-02-20 ENCOUNTER — Encounter: Payer: Self-pay | Admitting: Internal Medicine

## 2017-03-09 ENCOUNTER — Ambulatory Visit (INDEPENDENT_AMBULATORY_CARE_PROVIDER_SITE_OTHER): Payer: BLUE CROSS/BLUE SHIELD | Admitting: Internal Medicine

## 2017-03-09 ENCOUNTER — Encounter: Payer: Self-pay | Admitting: Internal Medicine

## 2017-03-09 VITALS — BP 118/68 | HR 78 | Ht 62.0 in | Wt 157.8 lb

## 2017-03-09 DIAGNOSIS — R0789 Other chest pain: Secondary | ICD-10-CM

## 2017-03-09 DIAGNOSIS — I428 Other cardiomyopathies: Secondary | ICD-10-CM

## 2017-03-09 NOTE — Patient Instructions (Signed)
Your physician recommends that you continue on your current medications as directed. Please refer to the Current Medication list given to you today. Your physician wants you to follow-up in: July, 2019 with Dr. Ross.  You will receive a reminder letter in the mail two months in advance. If you don't receive a letter, please call our office to schedule the follow-up appointment.  

## 2017-03-09 NOTE — Progress Notes (Signed)
Cardiology Office Note   Date:  03/09/2017   ID:  Tasha, Ross 1975-11-19, MRN [id number]  PCP:  Dolan Amen, FNP  Cardiologist:   Dietrich Pates, MD    Pt presents for f/u of CP and noncomplaction     History of Present Illness: Tasha Ross is a 41 y.o. female with a history of dizziness, sinus tach.  LVEF on echo was 53% with evid of noncompaction.  I saw her in Oct 2017  An ARB was added to regimn   MRI LVEF 55%  Normal GXT in Nov 2017  Pt has seen in ED in past for CP  Aypical  I saw her in September  I encouraged her to stay hydreated  Increase salt some  Since seen she says her breathing has been good  She denies dizzienss  No CP   Very active at work    Current Meds  Medication Sig  . azelastine (ASTELIN) 0.1 % nasal spray Place 2 sprays into both nostrils daily.  . fluticasone (FLONASE) 50 MCG/ACT nasal spray Place 2 sprays into both nostrils daily.  . metoprolol succinate (TOPROL-XL) 25 MG 24 hr tablet Take 0.5 tablets (12.5 mg total) by mouth daily.  . traMADol (ULTRAM) 50 MG tablet Take 1/2 to 1 tablet as needed for severe headache. Do not take more than 2-3 times a week  . venlafaxine XR (EFFEXOR-XR) 37.5 MG 24 hr capsule Take 1 capsule at night for 1 month, then increase to 2 capsules at night  . zonisamide (ZONEGRAN) 100 MG capsule Take 4 capsule at night.     Allergies:   Ibuprofen; Amitriptyline; Clindamycin; Sulfa antibiotics; Sulfur; and Sulfamethoxazole   Past Medical History:  Diagnosis Date  . Cardiomyopathy (HCC)    a. Echo 01/2015: EF 45-50% with diffuse HK.  Marland Kitchen History of stress test    a. ETT 11/16:  normal  . Migraine   . Near syncope   . Snoring 04/06/2015  . SVT (supraventricular tachycardia) (HCC)    a. Seen on event monitor 01/2015.  Marland Kitchen Vertigo     Past Surgical History:  Procedure Laterality Date  . CYSTECTOMY     from ovaries  . TUBAL LIGATION Bilateral   . VAGINAL HYSTERECTOMY       Social History:  The patient   reports that  has never smoked. she has never used smokeless tobacco. She reports that she drinks alcohol. She reports that she does not use drugs.   Family History:  The patient's family history includes Cancer in her father; Diabetes in her unknown relative; Heart disease in her maternal aunt; Hypertension in her unknown relative; Lupus in her unknown relative.    ROS:  Please see the history of present illness. All other systems are reviewed and  Negative to the above problem except as noted.    PHYSICAL EXAM: VS:  BP 118/68   Pulse 78   Ht 5\' 2"  (1.575 m)   Wt 157 lb 12.8 oz (71.6 kg)   SpO2 97%   BMI 28.86 kg/m   GEN: Well nourished, well developed, in no acute distress  HEENT: normal  Neck: JVP is normal  , carotid bruits, or masses Cardiac: RRR  No murmurs  rubs, or gallops,no edema  Respiratory:  clear to auscultation bilaterally, normal work of breathing GI: soft, nontender, nondistended, + BS  No hepatomegaly  MS: no deformity Moving all extremities   Skin: warm and dry, no rash Neuro:  Strength  and sensation are intact Psych: euthymic mood, full affect   EKG:  EKG is not ordered today.   Lipid Panel    Component Value Date/Time   CHOL 147 09/28/2015 1217   TRIG 48 09/28/2015 1217   HDL 61 09/28/2015 1217   CHOLHDL 2.4 09/28/2015 1217   VLDL 10 09/28/2015 1217   LDLCALC 76 09/28/2015 1217      Wt Readings from Last 3 Encounters:  03/09/17 157 lb 12.8 oz (71.6 kg)  01/19/17 162 lb 12.8 oz (73.8 kg)  01/12/17 168 lb (76.2 kg)      ASSESSMENT AND PLAN:  1  CP Pt denies  It has been atypical in past  ? If related to low BP  II had her stop ARB  Stay hydreated  Stay active  Would continue to follow    2  noncompaction cardiomyopathy  LVEF 55% Only on low dose b blocker  Had to pull off of ARB due to BP    3   CT  Evid of possible adrenal adenoma  Will need to be followed.     F/U in July 2019    Current medicines are reviewed at length with the patient  today.  The patient does not have concerns regarding medicines.  Signed, Dietrich PatesPaula Zali Kamaka, MD  03/09/2017 4:18 PM    [facility] [address], [place], [place]  [address] Phone: [phone]; Fax: [phone]

## 2017-04-10 ENCOUNTER — Other Ambulatory Visit: Payer: Self-pay | Admitting: Internal Medicine

## 2017-05-01 ENCOUNTER — Telehealth: Payer: Self-pay

## 2017-05-01 ENCOUNTER — Other Ambulatory Visit: Payer: Self-pay | Admitting: Neurology

## 2017-05-01 NOTE — Telephone Encounter (Signed)
Rx faxed to pt's verified preferred pharmacy

## 2017-05-01 NOTE — Telephone Encounter (Signed)
Pt LM on my voice mail stating that she contacted [facility] and they let her know that she has no refills remaining.  Last RX was sent on 01/07/17 #10 with 5 refills.  OK to send new Rx? Please advise.

## 2017-05-01 NOTE — Telephone Encounter (Signed)
Pt called and stated that she needs a refill of her Tramadol.  I let her know that she should still have some available refills.  She expressed that her pharmacy never sent her a refill text and that that usually means that she has no more available.  I let her know that I would call her pharmacy and see if she has some refills remaining. Pt stated that she would call pharmacy and return call to me.

## 2017-05-01 NOTE — Telephone Encounter (Signed)
Ok to send new Rx,#10 tablets a month. Thanks

## 2017-05-13 ENCOUNTER — Ambulatory Visit: Payer: BLUE CROSS/BLUE SHIELD | Admitting: Neurology

## 2017-06-17 ENCOUNTER — Emergency Department (HOSPITAL_COMMUNITY): Payer: BLUE CROSS/BLUE SHIELD

## 2017-06-17 ENCOUNTER — Encounter (HOSPITAL_COMMUNITY): Payer: Self-pay

## 2017-06-17 ENCOUNTER — Other Ambulatory Visit: Payer: Self-pay

## 2017-06-17 DIAGNOSIS — R0789 Other chest pain: Secondary | ICD-10-CM | POA: Insufficient documentation

## 2017-06-17 DIAGNOSIS — Z79899 Other long term (current) drug therapy: Secondary | ICD-10-CM | POA: Insufficient documentation

## 2017-06-17 DIAGNOSIS — R0602 Shortness of breath: Secondary | ICD-10-CM | POA: Insufficient documentation

## 2017-06-17 DIAGNOSIS — Z8679 Personal history of other diseases of the circulatory system: Secondary | ICD-10-CM | POA: Diagnosis not present

## 2017-06-17 DIAGNOSIS — R110 Nausea: Secondary | ICD-10-CM | POA: Diagnosis not present

## 2017-06-17 DIAGNOSIS — Z882 Allergy status to sulfonamides status: Secondary | ICD-10-CM | POA: Insufficient documentation

## 2017-06-17 LAB — BASIC METABOLIC PANEL
ANION GAP: 9 (ref 5–15)
BUN: 8 mg/dL (ref 6–20)
CO2: 24 mmol/L (ref 22–32)
Calcium: 8.9 mg/dL (ref 8.9–10.3)
Chloride: 105 mmol/L (ref 101–111)
Creatinine, Ser: 0.69 mg/dL (ref 0.44–1.00)
GFR calc Af Amer: >60 mL/min (ref >60)
Glucose, Bld: 98 mg/dL (ref 65–99)
Potassium: 3.6 mmol/L (ref 3.5–5.1)
SODIUM: 138 mmol/L (ref 135–145)

## 2017-06-17 LAB — CBC
HCT: 36.7 % (ref 36.0–46.0)
HEMOGLOBIN: 12.1 g/dL (ref 12.0–15.0)
MCH: 29.4 pg (ref 26.0–34.0)
MCHC: 33 g/dL (ref 30.0–36.0)
MCV: 89.1 fL (ref 78.0–100.0)
Platelets: 328 10*3/uL (ref 150–400)
RBC: 4.12 MIL/uL (ref 3.87–5.11)
RDW: 13.1 % (ref 11.5–15.5)
WBC: 7.9 10*3/uL (ref 4.0–10.5)

## 2017-06-17 LAB — I-STAT TROPONIN, ED: Troponin i, poc: 0 ng/mL (ref 0.00–0.08)

## 2017-06-17 LAB — I-STAT BETA HCG BLOOD, ED (MC, WL, AP ONLY)

## 2017-06-17 NOTE — ED Triage Notes (Signed)
Per Pt, Pt reports that she started to have chest pain yesterday that worsened today along with a headache. Describes it as central chest pain with some SOB, nausea. Denies V/D. Pain to the left shoulder to the left elbow.

## 2017-06-18 ENCOUNTER — Telehealth: Payer: Self-pay | Admitting: Internal Medicine

## 2017-06-18 ENCOUNTER — Emergency Department (HOSPITAL_COMMUNITY)
Admission: EM | Admit: 2017-06-18 | Discharge: 2017-06-18 | Disposition: A | Discharge status: Home / Self Care | Payer: BLUE CROSS/BLUE SHIELD | Attending: Emergency Medicine | Admitting: Emergency Medicine

## 2017-06-18 DIAGNOSIS — I429 Cardiomyopathy, unspecified: Secondary | ICD-10-CM

## 2017-06-18 DIAGNOSIS — R0789 Other chest pain: Secondary | ICD-10-CM

## 2017-06-18 MED ORDER — HYDROCODONE-ACETAMINOPHEN 5-325 MG PO TABS: 1.0000 | ORAL_TABLET | ORAL | 0 refills | Status: DC | PRN

## 2017-06-18 NOTE — Telephone Encounter (Signed)
New message  Patient calling with concerns about chest throbbing. Patient seen in ED, still having pain. Please call  Pt c/o of Chest Pain: STAT if CP now or developed within 24 hours  1. Are you having CP right now? Throbbing pain in middle of chest  2. Are you experiencing any other symptoms (ex. SOB, nausea, vomiting, sweating)? Nausea, vomiting, SOB  3. How long have you been experiencing CP? 3 days  4. Is your CP continuous or coming and going? continuous  5. Have you taken Nitroglycerin? NO ?

## 2017-06-18 NOTE — ED Provider Notes (Signed)
[facility] EMERGENCY DEPARTMENT Provider Note   CSN: [id number] Arrival date & time: 06/17/17  1913     History   Chief Complaint Chief Complaint  Patient presents with  . Chest Pain    HPI Rosena LATAYSHA VOHRA is a 42 y.o. female.  Patient presents with chest pain for the past 3 days that has been constant, worse with reclining, otherwise no modifying factors. No fever, cough, or congestion. She reports mild SOB and nausea without vomiting. No diaphoresis. The discomfort radiates to her back. Today the pain became a squeezing type pain, but remains constant. She has a history of cardiomyopathy which is followed by Dr. Tenny Craw.     Chest Pain   Associated symptoms include nausea and shortness of breath. Pertinent negatives include no diaphoresis, no fever and no vomiting.    Past Medical History:  Diagnosis Date  . Cardiomyopathy (HCC)    a. Echo 01/2015: EF 45-50% with diffuse HK.  Marland Kitchen History of stress test    a. ETT 11/16:  normal  . Migraine   . Near syncope   . Snoring 04/06/2015  . SVT (supraventricular tachycardia) (HCC)    a. Seen on event monitor 01/2015.  Marland Kitchen Vertigo     Patient Active Problem List   Diagnosis Date Noted  . Insomnia 04/19/2015  . Snoring 04/06/2015  . Cardiomyopathy (HCC)   . Head revolving around 02/06/2015  . Complicated migraine 01/17/2015  . Faintness 01/17/2015    Past Surgical History:  Procedure Laterality Date  . CYSTECTOMY     from ovaries  . TUBAL LIGATION Bilateral   . VAGINAL HYSTERECTOMY      OB History    No data available       Home Medications    Prior to Admission medications   Medication Sig Start Date End Date Taking? Authorizing Provider  azelastine (ASTELIN) 0.1 % nasal spray Place 2 sprays into both nostrils daily. 05/31/16   [provider]  fluticasone (FLONASE) 50 MCG/ACT nasal spray Place 2 sprays into both nostrils daily.    [provider]  metoprolol succinate  (TOPROL-XL) 25 MG 24 hr tablet TAKE ONE-HALF TABLET BY  MOUTH DAILY 04/10/17   Pricilla Riffle, MD  traMADol (ULTRAM) 50 MG tablet TAKE 1/2-1 TABLET AS NEEDED FOR SEVERE HEADACHE NO MORE THAN 2-3 TIMES PER WEEK 05/01/17   Van Clines, MD  venlafaxine XR (EFFEXOR-XR) 37.5 MG 24 hr capsule Take 1 capsule at night for 1 month, then increase to 2 capsules at night 01/07/17   Van Clines, MD  zonisamide [facility]) 100 MG capsule Take 4 capsule at night. 01/07/17   Van Clines, MD    Family History Family History  Problem Relation Age of Onset  . Cancer Father   . Hypertension Unknown   . Lupus Unknown   . Diabetes Unknown   . Heart disease Maternal Aunt        Patient does not know details  . Stroke Neg Hx   . Fainting Neg Hx   . Migraines Neg Hx   . Seizures Neg Hx     Social History Social History   Tobacco Use  . Smoking status: Never Smoker  . Smokeless tobacco: Never Used  Substance Use Topics  . Alcohol use: Yes    Alcohol/week: 0.0 oz    Comment: Rare - once or twice a month.  . Drug use: No     Allergies   Ibuprofen; Amitriptyline; Clindamycin;  Sulfa antibiotics; Sulfur; and Sulfamethoxazole   Review of Systems Review of Systems  Constitutional: Negative for chills, diaphoresis and fever.  HENT: Negative.   Respiratory: Positive for shortness of breath.   Cardiovascular: Positive for chest pain.  Gastrointestinal: Positive for nausea. Negative for vomiting.  Musculoskeletal: Negative.   Skin: Negative.   Neurological: Negative.      Physical Exam Updated Vital Signs BP 111/73 (BP Location: Left Arm)   Pulse 62   Temp 98.5 F (36.9 C) (Oral)   Resp 16   Ht 5\' 2"  (1.575 m)   Wt 65.8 kg (145 lb)   SpO2 98%   BMI 26.52 kg/m   Physical Exam  Constitutional: She appears well-developed and well-nourished.  HENT:  Head: Normocephalic.  Neck: Normal range of motion. Neck supple.  Cardiovascular: Normal rate and regular rhythm.  No carotid bruits.    Pulmonary/Chest: Effort normal and breath sounds normal. She has no wheezes. She has no rales.  Abdominal: Soft. Bowel sounds are normal. There is no tenderness. There is no rebound and no guarding.  Musculoskeletal: Normal range of motion.       Right lower leg: She exhibits no edema.  Neurological: She is alert. No cranial nerve deficit.  Skin: Skin is warm and dry.  Psychiatric: She has a normal mood and affect.  Nursing note and vitals reviewed.    ED Treatments / Results  Labs (all labs ordered are listed, but only abnormal results are displayed) Labs Reviewed  BASIC METABOLIC PANEL  CBC  I-STAT TROPONIN, ED  I-STAT BETA HCG BLOOD, ED ([facility], [facility], [facility] ONLY)    EKG  EKG Interpretation  Date/Time:  Wednesday June 17 2017 19:16:56 EST Ventricular Rate:  74 PR Interval:  134 QRS Duration: 78 QT Interval:  370 QTC Calculation: 410 R Axis:   52 Text Interpretation:  Normal sinus rhythm Normal ECG No significant change since last tracing Confirmed by Rochele Raring [phone]) on 06/18/2017 1:23:13 AM       Radiology Dg Chest 2 View  Result Date: 06/17/2017 CLINICAL DATA:  42 year old female with history of central chest pain and shortness of breath with nausea for the past 3 days. History of cardiomyopathy with supraventricular tachycardia. EXAM: CHEST  2 VIEW COMPARISON:  Chest x-ray 01/12/2017. FINDINGS: Lung volumes are normal. No consolidative airspace disease. No pleural effusions. No pneumothorax. No pulmonary nodule or mass noted. Pulmonary vasculature and the cardiomediastinal silhouette are within normal limits. IMPRESSION: No radiographic evidence of acute cardiopulmonary disease. Electronically Signed   By: Trudie Reed M.D.   On: 06/17/2017 19:54    Procedures Procedures (including critical care time)  Medications Ordered in ED Medications - No data to display   Initial Impression / Assessment and Plan / ED Course  I have reviewed the triage vital signs and  the nursing notes.  Pertinent labs & imaging results that were available during my care of the patient were reviewed by me and considered in my medical decision making (see chart for details).     Patient here with chest pain, atypical for ACS - constant, radiates to back, worse with reclining.   EKG shows no concerning changes. Labs, including troponin, are negative. CXR clear.   Chart reviewed. Per note by Dr. Tenny Craw on office visit 11/18, Echo 2016 with EF 45-50%, mild hypokinesis; stress test 2016 - normal. H/o SVT, none today.  Reviewed with Dr. Elesa Massed. Feel the patient is appropriate for discharge home and follow up with Dr. Tenny Craw in  outpatient setting.   Final Clinical Impressions(s) / ED Diagnoses   Final diagnoses:  None   1. Nonspecific chest pain  ED Discharge Orders    None       Elpidio Anis, PA-C 06/18/17 0139    Ward, Layla Maw, DO 06/18/17 [phone]

## 2017-06-18 NOTE — ED Notes (Signed)
ED Provider at bedside. 

## 2017-06-18 NOTE — Telephone Encounter (Signed)
Per Dr. Tenny Craw patient should have echocardiogram and office visit. Called patient and informed Dr. Tenny Craw will see her tomorrow.

## 2017-06-18 NOTE — Telephone Encounter (Signed)
Will route to Dr. Tenny Craw for review of ER notes from yesterday and recommendation.

## 2017-06-19 ENCOUNTER — Other Ambulatory Visit: Payer: Self-pay

## 2017-06-19 ENCOUNTER — Ambulatory Visit (HOSPITAL_COMMUNITY): Payer: BLUE CROSS/BLUE SHIELD | Attending: Cardiology

## 2017-06-19 ENCOUNTER — Encounter: Payer: Self-pay | Admitting: Internal Medicine

## 2017-06-19 ENCOUNTER — Ambulatory Visit (INDEPENDENT_AMBULATORY_CARE_PROVIDER_SITE_OTHER): Payer: BLUE CROSS/BLUE SHIELD | Admitting: Internal Medicine

## 2017-06-19 VITALS — BP 104/74 | HR 74 | Ht 62.0 in | Wt 151.0 lb

## 2017-06-19 DIAGNOSIS — G43909 Migraine, unspecified, not intractable, without status migrainosus: Secondary | ICD-10-CM | POA: Insufficient documentation

## 2017-06-19 DIAGNOSIS — I429 Cardiomyopathy, unspecified: Secondary | ICD-10-CM | POA: Insufficient documentation

## 2017-06-19 DIAGNOSIS — I428 Other cardiomyopathies: Secondary | ICD-10-CM | POA: Diagnosis not present

## 2017-06-19 DIAGNOSIS — I471 Supraventricular tachycardia: Secondary | ICD-10-CM | POA: Insufficient documentation

## 2017-06-19 DIAGNOSIS — R072 Precordial pain: Secondary | ICD-10-CM

## 2017-06-19 MED ORDER — RANITIDINE HCL 75 MG PO TABS: 75.0000 mg | ORAL_TABLET | Freq: Two times a day (BID) | ORAL | Status: DC

## 2017-06-19 NOTE — Progress Notes (Signed)
Cardiology Office Note   Date:  06/19/2017   ID:  Tanita, Cann 31-Dec-1975, MRN [id number]  PCP:  Dolan Amen, FNP  Cardiologist:   Dietrich Pates, MD    Pt presents for f/u of CP     History of Present Illness: Tasha Ross is a 42 y.o. 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  LVEF on echo was 53% with evid of noncompaction.  I saw her in Oct 2017  An ARB was added to regimn  Stope because concern for low BP at times   MRI LVEF 55%  Normal GXT in Nov 2017  Pt has seen in ED in past for CP  Aypical  I saw her in November  CP was atypical   She went to the ED yesteday  Complained of constant chest pain  Worse with laying back  No SOB   CP was not pleuritic  Sats in ED normal  Trop 0  Sent home   The pt says today that the discomfort is still there though better   No SOB   No Cough  No F/C     Current Meds  Medication Sig  . azelastine (ASTELIN) 0.1 % nasal spray Place 2 sprays into both nostrils daily.  . fluticasone (FLONASE) 50 MCG/ACT nasal spray Place 2 sprays into both nostrils daily.  . metoprolol succinate (TOPROL-XL) 25 MG 24 hr tablet TAKE ONE-HALF TABLET BY  MOUTH DAILY  . traMADol (ULTRAM) 50 MG tablet TAKE 1/2-1 TABLET AS NEEDED FOR SEVERE HEADACHE NO MORE THAN 2-3 TIMES PER WEEK  . venlafaxine XR (EFFEXOR-XR) 37.5 MG 24 hr capsule Take 1 capsule at night for 1 month, then increase to 2 capsules at night  . zonisamide (ZONEGRAN) 100 MG capsule Take 4 capsule at night.     Allergies:   Ibuprofen; Amitriptyline; Clindamycin; Sulfa antibiotics; Sulfur; and Sulfamethoxazole   Past Medical History:  Diagnosis Date  . Cardiomyopathy (HCC)    a. Echo 01/2015: EF 45-50% with diffuse HK.  Marland Kitchen History of stress test    a. ETT 11/16:  normal  . Migraine   . Near syncope   . Snoring 04/06/2015  . SVT (supraventricular tachycardia) (HCC)    a. Seen on event monitor 01/2015.  Marland Kitchen Vertigo     Past Surgical History:  Procedure Laterality Date   . CYSTECTOMY     from ovaries  . TUBAL LIGATION Bilateral   . VAGINAL HYSTERECTOMY       Social History:  The patient  reports that  has never smoked. she has never used smokeless tobacco. She reports that she drinks alcohol. She reports that she does not use drugs.   Family History:  The patient's family history includes Cancer in her father; Diabetes in her unknown relative; Heart disease in her maternal aunt; Hypertension in her unknown relative; Lupus in her unknown relative.    ROS:  Please see the history of present illness. All other systems are reviewed and  Negative to the above problem except as noted.    PHYSICAL EXAM: VS:  BP 104/74 (BP Location: Left Arm, Patient Position: Sitting, Cuff Size: Normal)   Pulse 74   Ht 5\' 2"  (1.575 m)   Wt 151 lb (68.5 kg)   SpO2 98%   BMI 27.62 kg/m   GEN: Well nourished, well developed, in no acute distress  HEENT: normal  Neck: JVP is normal  , carotid bruits, or masses Cardiac: RRR  No murmurs  rubs, or gallops,no edema  Respiratory:  clear to auscultation bilaterally, normal work of breathing GI: soft, nontender, nondistended, + BS  No hepatomegaly  MS: no deformity Moving all extremities   Skin: warm and dry, no rash Neuro:  Strength and sensation are intact Psych: euthymic mood, full affect   EKG:  EKG is not ordered today.   Lipid Panel    Component Value Date/Time   CHOL 147 09/28/2015 1217   TRIG 48 09/28/2015 1217   HDL 61 09/28/2015 1217   CHOLHDL 2.4 09/28/2015 1217   VLDL 10 09/28/2015 1217   LDLCALC 76 09/28/2015 1217      Wt Readings from Last 3 Encounters:  06/19/17 151 lb (68.5 kg)  06/17/17 145 lb (65.8 kg)  03/09/17 157 lb 12.8 oz (71.6 kg)      ASSESSMENT AND PLAN:  1  CP I do not think cardiac   ? 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     F/U in July 2019    Current medicines are reviewed at length with the patient today.  The patient does not have concerns regarding medicines.  Signed, Dietrich Pates, MD  06/19/2017 2:09 PM    [facility] [address] [place], [place], [place]  [address] Phone: [phone]; Fax: [phone]

## 2017-06-19 NOTE — Patient Instructions (Signed)
Your physician has recommended you make the following change in your medication:  1.) take zantac 75 mg 1-2 times per day

## 2017-08-07 DIAGNOSIS — F5101 Primary insomnia: Secondary | ICD-10-CM | POA: Diagnosis not present

## 2017-08-07 DIAGNOSIS — Z0001 Encounter for general adult medical examination with abnormal findings: Secondary | ICD-10-CM | POA: Diagnosis not present

## 2017-08-07 DIAGNOSIS — Z1231 Encounter for screening mammogram for malignant neoplasm of breast: Secondary | ICD-10-CM | POA: Diagnosis not present

## 2017-08-07 DIAGNOSIS — Z0000 Encounter for general adult medical examination without abnormal findings: Secondary | ICD-10-CM | POA: Diagnosis not present

## 2017-08-07 DIAGNOSIS — Z23 Encounter for immunization: Secondary | ICD-10-CM | POA: Diagnosis not present

## 2017-08-07 DIAGNOSIS — Z13220 Encounter for screening for lipoid disorders: Secondary | ICD-10-CM | POA: Diagnosis not present

## 2017-08-07 DIAGNOSIS — I10 Essential (primary) hypertension: Secondary | ICD-10-CM | POA: Diagnosis not present

## 2017-08-07 DIAGNOSIS — Z136 Encounter for screening for cardiovascular disorders: Secondary | ICD-10-CM | POA: Diagnosis not present

## 2017-08-07 DIAGNOSIS — N898 Other specified noninflammatory disorders of vagina: Secondary | ICD-10-CM | POA: Diagnosis not present

## 2017-08-14 ENCOUNTER — Telehealth: Payer: Self-pay | Admitting: Internal Medicine

## 2017-08-14 NOTE — Telephone Encounter (Signed)
Dr. Tenny Craw and Nani Skillern, this pts Provider with [facility] is calling to request that you advise on the cardiovascular portion of this pts Complete Physical, so that she may be accepted and attend the [employer].  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  Simona Huh Nurse is calling requesting that you complete the cardiovascular portion of this physical, for the NP does not feel comfortable doing so, given she is followed by Cards.  Dr Tenny Craw, you can see their progress note on this pt, from 08/07/17, in care everywhere, request updates, then click on [facility] Hyacinth Meeker NP with [facility].  Per Karen Chafe RN, they will be faxing over the cardiovascular portion of the physical paperwork, where you will need to advise on, and fax back to their office ASAP.  Both Jessi and NP are aware that you and Michalene are out of the office today, and you will not be back until mid next week.  Informed Brayton Caves that Nani Skillern may be able to provide you with a short synopsis of what the cardiovascular piece of the paperwork is asking for.  If you agree to clear, possibly you could write a short supporting letter stating she is cleared from a cardiac perspective, to attend the police academy.  Brayton Caves states that the pt needs this done in a pretty timely manner. Gave them our fax number at [phone] ATTN: Michalene RN and Dr Tenny Craw.  Brayton Caves is aware that we will follow-up with their office, once this completed.

## 2017-08-14 NOTE — Telephone Encounter (Signed)
New Message   Brayton Caves is calling from [facility] on behalf of patient. 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 They are not comfortable with signing the paper work unless Dr. Tenny Craw says its okay. Please call to discuss.

## 2017-09-03 DIAGNOSIS — Z1231 Encounter for screening mammogram for malignant neoplasm of breast: Secondary | ICD-10-CM | POA: Diagnosis not present

## 2017-10-09 ENCOUNTER — Telehealth: Payer: Self-pay | Admitting: Internal Medicine

## 2017-10-09 NOTE — Telephone Encounter (Signed)
Pt needs cardiac clearance note  so she  may participate with a training class for [employer] . Will forward to Dr Tenny Craw for review and call pt when ready so she can pick it up /cy

## 2017-10-09 NOTE — Telephone Encounter (Signed)
Pt calling   Pt calling to see status of form that she said was faxed over from her employer  concerning a statement that she can take her training class for work. They are stating that she has high blood pressure and pt need to be cleared. Please call pt to advise. Pt stated she need the form now ASAP

## 2017-10-09 NOTE — Telephone Encounter (Signed)
Patient is cleared from cardiac perspecit to participate in training program.   I would like to review what program entails Have them fax program for review

## 2017-10-12 NOTE — Telephone Encounter (Signed)
Follow up    Patient is calling in reference to a letter that she is requesting so that she can participate in training program.

## 2017-10-12 NOTE — Telephone Encounter (Signed)
Patient will pick up letter tomorrow and bring a brochure for Dr. Tenny Craw to review.

## 2017-10-13 ENCOUNTER — Encounter: Payer: Self-pay | Admitting: *Deleted

## 2017-10-23 ENCOUNTER — Encounter (HOSPITAL_COMMUNITY): Payer: Self-pay

## 2017-10-23 ENCOUNTER — Ambulatory Visit (HOSPITAL_COMMUNITY)
Admission: EM | Admit: 2017-10-23 | Discharge: 2017-10-23 | Disposition: A | Discharge status: Home / Self Care | Payer: BLUE CROSS/BLUE SHIELD | Attending: Family Medicine | Admitting: Family Medicine

## 2017-10-23 DIAGNOSIS — N898 Other specified noninflammatory disorders of vagina: Secondary | ICD-10-CM

## 2017-10-23 MED ORDER — ACYCLOVIR 800 MG PO TABS: 800.0000 mg | ORAL_TABLET | Freq: Three times a day (TID) | ORAL | 6 refills | Status: DC

## 2017-10-23 MED ORDER — ACYCLOVIR 5 % EX OINT: 1.0000 "application " | TOPICAL_OINTMENT | CUTANEOUS | 1 refills | Status: DC

## 2017-10-23 NOTE — ED Provider Notes (Signed)
[facility]    CSN: [id number] Arrival date & time: 10/23/17  1649     History   Chief Complaint Chief Complaint  Patient presents with  . Vaginal Itching    HPI Tasha Ross is a 42 y.o. female.   HPI  Patient states that she had one outbreak of herpes 17 years ago.  She has not had any since.  Yesterday she noticed a small irritation at her hairline.  She looked at it with a mirror and it is an ulcer about 1/4 inch across.  It is not draining.  It is tender, irritating to touch.  She thinks it is a herpetic lesion.  She is here for medication.  She does not wish additional testing.  She does not wish testing for sexually transmitted disease.  She states that she is not currently sexually active and has not been for years.  She has no abdominal pain.  She had a hysterectomy.  She has no vaginal discharge.  Past Medical History:  Diagnosis Date  . Cardiomyopathy (HCC)    a. Echo 01/2015: EF 45-50% with diffuse HK.  Marland Kitchen History of stress test    a. ETT 11/16:  normal  . Migraine   . Near syncope   . Snoring 04/06/2015  . SVT (supraventricular tachycardia) (HCC)    a. Seen on event monitor 01/2015.  Marland Kitchen Vertigo     Patient Active Problem List   Diagnosis Date Noted  . Insomnia 04/19/2015  . Snoring 04/06/2015  . Cardiomyopathy (HCC)   . Head revolving around 02/06/2015  . Complicated migraine 01/17/2015  . Faintness 01/17/2015    Past Surgical History:  Procedure Laterality Date  . CYSTECTOMY     from ovaries  . TUBAL LIGATION Bilateral   . VAGINAL HYSTERECTOMY      OB History   None      Home Medications    Prior to Admission medications   Medication Sig Start Date End Date Taking? Authorizing Provider  azelastine (ASTELIN) 0.1 % nasal spray Place 2 sprays into both nostrils daily. 05/31/16  Yes [provider]  fluticasone (FLONASE) 50 MCG/ACT nasal spray Place 2 sprays into both nostrils daily.   Yes [provider]    metoprolol succinate (TOPROL-XL) 25 MG 24 hr tablet TAKE ONE-HALF TABLET BY  MOUTH DAILY 04/10/17  Yes Pricilla Riffle, MD  venlafaxine XR (EFFEXOR-XR) 37.5 MG 24 hr capsule Take 1 capsule at night for 1 month, then increase to 2 capsules at night 01/07/17  Yes Van Clines, MD  zonisamide [facility]) 100 MG capsule Take 4 capsule at night. 01/07/17  Yes Van Clines, MD  acyclovir (ZOVIRAX) 800 MG tablet Take 1 tablet (800 mg total) by mouth 3 (three) times daily. 10/23/17   Eustace Moore, MD  acyclovir ointment (ZOVIRAX) 5 % Apply 1 application topically every 3 (three) hours. 10/23/17   Eustace Moore, MD    Family History Family History  Problem Relation Age of Onset  . Cancer Father   . Hypertension Unknown   . Lupus Unknown   . Diabetes Unknown   . Heart disease Maternal Aunt        Patient does not know details  . Stroke Neg Hx   . Fainting Neg Hx   . Migraines Neg Hx   . Seizures Neg Hx     Social History Social History   Tobacco Use  . Smoking status: Never Smoker  . Smokeless tobacco:  Never Used  Substance Use Topics  . Alcohol use: Yes    Alcohol/week: 0.0 oz    Comment: Rare - once or twice a month.  . Drug use: No     Allergies   Ibuprofen; Amitriptyline; Clindamycin; Sulfa antibiotics; Sulfur; and Sulfamethoxazole   Review of Systems Review of Systems  Constitutional: Negative for chills and fever.  HENT: Negative for ear pain and sore throat.   Eyes: Negative for pain and visual disturbance.  Respiratory: Negative for cough and shortness of breath.   Cardiovascular: Negative for chest pain and palpitations.  Gastrointestinal: Negative for abdominal pain and vomiting.  Genitourinary: Positive for genital sores. Negative for dysuria, hematuria, vaginal bleeding and vaginal discharge.  Musculoskeletal: Negative for arthralgias and back pain.  Skin: Negative for color change and rash.  Neurological: Negative for seizures and syncope.  All other  systems reviewed and are negative.    Physical Exam Triage Vital Signs ED Triage Vitals  Enc Vitals Group     BP 10/23/17 1722 117/80     Pulse Rate 10/23/17 1722 80     Resp 10/23/17 1722 20     Temp 10/23/17 1722 98.5 F (36.9 C)     Temp Source 10/23/17 1722 Oral     SpO2 --      Weight --      Height --      Head Circumference --      Peak Flow --      Pain Score 10/23/17 1720 0     Pain Loc --      Pain Edu? --      Excl. in GC? --    No data found.  Updated Vital Signs BP 117/80 (BP Location: Left Arm)   Pulse 80   Temp 98.5 F (36.9 C) (Oral)   Resp 20   Visual Acuity Right Eye Distance:   Left Eye Distance:   Bilateral Distance:    Right Eye Near:   Left Eye Near:    Bilateral Near:     Physical Exam  Constitutional: She appears well-developed and well-nourished. No distress.  HENT:  Head: Normocephalic and atraumatic.  Mouth/Throat: Oropharynx is clear and moist.  Eyes: Pupils are equal, round, and reactive to light. Conjunctivae are normal.  Neck: Normal range of motion.  Cardiovascular: Normal rate.  Pulmonary/Chest: Effort normal. No respiratory distress.  Abdominal: Soft. She exhibits no distension.  Musculoskeletal: Normal range of motion. She exhibits no edema.  Neurological: She is alert.  Skin: Skin is warm and dry.     UC Treatments / Results  Labs (all labs ordered are listed, but only abnormal results are displayed) Labs Reviewed - No data to display  EKG None  Radiology No results found.  Procedures Procedures (including critical care time)  Medications Ordered in UC Medications - No data to display  Initial Impression / Assessment and Plan / UC Course  I have reviewed the triage vital signs and the nursing notes.  Pertinent labs & imaging results that were available during my care of the patient were reviewed by me and considered in my medical decision making (see chart for details).     Discussed recurrent  herpes.  Using medication immediately upon prodrome or noticing any lesions.  Contagion. Final Clinical Impressions(s) / UC Diagnoses   Final diagnoses:  Itching in the vaginal area     Discharge Instructions     Use the medicine at first sign of irritation   ED Prescriptions  Medication Sig Dispense Auth. Provider   acyclovir (ZOVIRAX) 800 MG tablet Take 1 tablet (800 mg total) by mouth 3 (three) times daily. 9 tablet Eustace Moore, MD   acyclovir ointment (ZOVIRAX) 5 % Apply 1 application topically every 3 (three) hours. 15 g Eustace Moore, MD     Controlled Substance Prescriptions [place] Controlled Substance Registry consulted? Not Applicable   Eustace Moore, MD 10/23/17 [phone]

## 2017-10-23 NOTE — ED Triage Notes (Signed)
Pt states that she has some vaginal irritation that has been going on for a few days.

## 2017-10-23 NOTE — Discharge Instructions (Signed)
Use the medicine at first sign of irritation

## 2017-10-26 ENCOUNTER — Encounter

## 2017-10-26 ENCOUNTER — Ambulatory Visit (INDEPENDENT_AMBULATORY_CARE_PROVIDER_SITE_OTHER): Payer: BLUE CROSS/BLUE SHIELD | Admitting: Neurology

## 2017-10-26 ENCOUNTER — Encounter: Payer: Self-pay | Admitting: Neurology

## 2017-10-26 ENCOUNTER — Other Ambulatory Visit: Payer: Self-pay

## 2017-10-26 VITALS — BP 106/72 | HR 80 | Ht 62.0 in | Wt 151.0 lb

## 2017-10-26 DIAGNOSIS — G43109 Migraine with aura, not intractable, without status migrainosus: Secondary | ICD-10-CM

## 2017-10-26 MED ORDER — SUMATRIPTAN 20 MG/ACT NA SOLN: NASAL | 5 refills | Status: DC

## 2017-10-26 MED ORDER — TRAMADOL HCL 50 MG PO TABS: ORAL_TABLET | ORAL | 5 refills | Status: DC

## 2017-10-26 NOTE — Patient Instructions (Signed)
1. If headaches start becoming more consistent, restart Effexor (venlafaxine)  2. Try sumatriptan nasal spray: spray in one nostril at onset of migraine. May spray in other nostril if headache persists after an hour. Do not use more than 3 times a week 3. Take Tramadol only as needed for severe migraines. Do not use more than 3 times a week 4. Follow-up in 6 months, call for any changes.

## 2017-10-26 NOTE — Progress Notes (Signed)
NEUROLOGY FOLLOW UP OFFICE NOTE  Tasha Ross [id number]  DOB: 16-Feb-1976  HISTORY OF PRESENT ILLNESS: I had the pleasure of seeing Tasha Ross in follow-up in the neurology clinic on 10/26/2017.  The patient was last seen 9 months ago for vertigo, headaches, and syncope. MRI brain unremarkable. She had an abnormal echo and was referred to Cardiology, who did a holter monitor with note of tachycardia with heart rates up to 180-190 bpm. It was felt that amitriptyline was the potential cause. 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 She denies any further syncopal episodes since April 2018. She was reporting sleep issues and headaches on her last visit. She was taking Zonisamide 400mg  qhs and Effexor was added. She reports that she stopped taking Zonisamide and Effexor several months ago because she felt she was taking too much pills. She also states that she has not had any headaches since September and was doing really well until April or May when she had 2 weeks of migraines with nausea and light sensitivity, just wanting to lay down. After the 2 weeks, she felt better and reports doing very well in the past 1-2 months with no migraine recurrence off medication. The Tramadol did help. She also tried Maxalt ODT which helped but caused a bad taste in her mouth. She denies any dizziness, vision changes, focal numbness/tingling/weakness, no falls.  HPI 01/17/2015: This is a 42 yo RH woman presenting with vertigo, headaches, and episodes of passing out that started in 2009 or 2010. She had seen neurologist Dr. Shane Crutch in 2012 while living in [place], reporting that she is having the exact same symptoms she had, they had gotten under control while she was seeing him, with no episodes for a year, until January 2016 as she was transitioning for her move to [facility] last March 2016. She reports migraines  followed a couple of days later by vertigo. She would brace herself, trying to sit still, but would lose consciousness. She has fallen to the floor at work. Sometimes she can brace herself, other times there is no warning. Symptoms are brief, she would wake up on the floor. She was admitted to [facility] 10/22/14 for near syncope, she reported right-sided weakness with slurred speech, that improved during transport. Glucose noted to be 72. She had an MRI brain without contrast with no acute intracranial changes. MRA brain reported a 1.64mm bulbous appearance of the distal left PICA branches in the region of the inferior cerebellar vermis. It may represent a vascular loop, however a small aneurysm is not entirely excluded. 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 Records from her previous neurologist were reviewed. He was concerned that dizziness and vertigo were not physiological in nature. He had done an MRI/MRA brain reported as normal as well. TSH, RPR, CBC normal. B12 low normal at 278, she had received replacement treatment. For migraine prophylaxis, she was started on amitriptyline in 2012, then switched to Topamax in 2013. She reports migraines are retro-orbital, sometimes dull or sharp pain, with phonophobia, nausea. She recalls taking amitriptyline and Topamax, and states these were stopped because the episodes stopped. She recalls taking Imitrex, Fioricet, Maxalt which would help initially. She feels the amitriptyline and citalopram in the past helped put her in a calm mode. She has been having more frequent migraines occurring twice a week, lasting 3-4 hours. Ibuprofen does not help. The  vertigo occurs a couple of days after a migraine, with spinning sensation lasting up to a week. She usually gets less than 6 hours of sleep, feeling "real agitated." She reports the last syncopal episode was in June. When asked about stress, she does indicate that she  has been working for [employer] since 2014, but had to move to a different location with her move in January.   She had a syncopal episode at work last 08/19/16 and has had a headache, neck pain, and seeing floaters since then. 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 No convulsive activity seen on video. She came to and recalls feeling sick to her stomach and unable to talk, with her words not coming out clear. She reports another syncopal episode in March on the way to work, she felt a pressure in her chest. She was recently treated with antibiotic for congestion, but has started feeling ill again. She wonders if weather changes are a trigger, she has become more hypersensitive to smells since Spring started.   Prior preventative medications: amitriptyline, topamax, Toprol, zonisamide, citalopram Prior rescue medications: imitrex, relpax, maxalt, zomig, fioricet  PAST MEDICAL HISTORY: Past Medical History:  Diagnosis Date  . Cardiomyopathy (HCC)    a. Echo 01/2015: EF 45-50% with diffuse HK.  Marland Kitchen History of stress test    a. ETT 11/16:  normal  . Migraine   . Near syncope   . Snoring 04/06/2015  . SVT (supraventricular tachycardia) (HCC)    a. Seen on event monitor 01/2015.  Marland Kitchen Vertigo     MEDICATIONS: Current Outpatient Medications on File Prior to Visit  Medication Sig Dispense Refill  . acyclovir (ZOVIRAX) 800 MG tablet Take 1 tablet (800 mg total) by mouth 3 (three) times daily. 9 tablet 6  . acyclovir ointment (ZOVIRAX) 5 % Apply 1 application topically every 3 (three) hours. 15 g 1  . azelastine (ASTELIN) 0.1 % nasal spray Place 2 sprays into both nostrils daily.  11  . fluticasone (FLONASE) 50 MCG/ACT nasal spray Place 2 sprays into both nostrils daily.    . metoprolol succinate (TOPROL-XL) 25 MG 24 hr tablet TAKE ONE-HALF TABLET BY  MOUTH DAILY 45 tablet 2  . metoprolol tartrate  (LOPRESSOR) 25 MG tablet Take by mouth.    . venlafaxine XR (EFFEXOR-XR) 37.5 MG 24 hr capsule Take 1 capsule at night for 1 month, then increase to 2 capsules at night 60 capsule 6  . zonisamide (ZONEGRAN) 100 MG capsule Take 4 capsule at night. 360 capsule 3   No current facility-administered medications on file prior to visit.     ALLERGIES: Allergies  Allergen Reactions  . Ibuprofen Other (See Comments) and Rash    Per Neurologist request Per Neurologist request  . Amitriptyline Other (See Comments) and Rash    Causing headaches and head pains Causing headaches and head pains  . Clindamycin Rash  . Sulfa Antibiotics Swelling and Rash  . Sulfur Swelling  . Sulfamethoxazole Swelling    FAMILY HISTORY: Family History  Problem Relation Age of Onset  . Cancer Father   . Hypertension Unknown   . Lupus Unknown   . Diabetes Unknown   . Heart disease Maternal Aunt        Patient does not know details  . Stroke Neg Hx   . Fainting Neg Hx   . Migraines Neg Hx   .  Seizures Neg Hx     SOCIAL HISTORY: Social History   Socioeconomic History  . Marital status: Single    Spouse name: Not on file  . Number of children: 4  . Years of education: Not on file  . Highest education level: Not on file  Occupational History  . Not on file  Social Needs  . Financial resource strain: Not on file  . Food insecurity:    Worry: Not on file    Inability: Not on file  . Transportation needs:    Medical: Not on file    Non-medical: Not on file  Tobacco Use  . Smoking status: Never Smoker  . Smokeless tobacco: Never Used  Substance and Sexual Activity  . Alcohol use: Yes    Alcohol/week: 0.0 oz    Comment: Rare - once or twice a month.  . Drug use: No  . Sexual activity: Not on file  Lifestyle  . Physical activity:    Days per week: Not on file    Minutes per session: Not on file  . Stress: Not on file  Relationships  . Social connections:    Talks on phone: Not on file     Gets together: Not on file    Attends religious service: Not on file    Active member of club or organization: Not on file    Attends meetings of clubs or organizations: Not on file    Relationship status: Not on file  . Intimate partner violence:    Fear of current or ex partner: Not on file    Emotionally abused: Not on file    Physically abused: Not on file    Forced sexual activity: Not on file  Other Topics Concern  . Not on file  Social History Narrative   Lives with 3 of her sons.  Has 4 sons.  Works for [employer].  Education: some college.    REVIEW OF SYSTEMS: Constitutional: No fevers, chills, or sweats, no generalized fatigue, change in appetite Eyes: No visual changes, double vision, eye pain Ear, nose and throat: No hearing loss, ear pain, nasal congestion, sore throat Cardiovascular: No chest pain, palpitations Respiratory:  No shortness of breath at rest or with exertion, wheezes GastrointestinaI: No nausea, vomiting, diarrhea, abdominal pain, fecal incontinence Genitourinary:  No dysuria, urinary retention or frequency Musculoskeletal:  No neck pain, +back pain Integumentary: No rash, pruritus, skin lesions Neurological: as above Psychiatric: No depression, insomnia, no anxiety Endocrine: No palpitations, fatigue, diaphoresis, mood swings, change in appetite, change in weight, increased thirst Hematologic/Lymphatic:  No anemia, purpura, petechiae. Allergic/Immunologic: no itchy/runny eyes, nasal congestion, recent allergic reactions, rashes  PHYSICAL EXAM: Vitals:   10/26/17 0838  BP: 106/72  Pulse: 80  SpO2: 99%   General: No acute distress Head:  Normocephalic/atraumatic Neck: supple, no paraspinal tenderness, full range of motion Heart:  Regular rate and rhythm Lungs:  Clear to auscultation bilaterally Back: No paraspinal tenderness Skin/Extremities: No rash, no edema Neurological Exam: alert and oriented to person, place, and time. No aphasia or  dysarthria. Fund of knowledge is appropriate.  Recent and remote memory are intact.  Attention and concentration are normal.    Able to name objects and repeat phrases. Cranial nerves: Pupils equal, round, reactive to light.  Extraocular movements intact with no nystagmus. Visual fields full. Facial sensation intact. No facial asymmetry. Tongue, uvula, palate midline.  Motor: Bulk and tone normal, muscle strength 5/5 throughout with no pronator drift.  Sensation to  light touch intact.  No extinction to double simultaneous stimulation.  Deep tendon reflexes 2+ throughout, toes downgoing.  Finger to nose testing intact.  Gait narrow-based and steady, able to tandem walk adequately.  Romberg negative.  IMPRESSION: This is a 42 yo RH woman with a history of headaches, dizziness, and syncope since 2009 or 2010, symptoms had quieted down, then recurred at the beginning of 2016. On review of records, her previous neurologist was concerned dizziness and vertigo were not physiologic in nature. She did not tolerate amitriptyline for headache prophylaxis, in addition, Cardiology felt this was causing SVT on holter monitor. Amitriptyline discontinued, she is now on Toprol for SVT. No further syncopal episodes since April 2018. She reports being headache-free for the past 10 months except for 2 weeks in April or May. She stopped Zonisamide and Effexor. We discussed that if headaches recur and become more consistent, would restart Effexor. She had side effects on several triptans, but would like to try the sumatriptan nasal spray. She has prn Tramadol for severe migraines. She knows to minimize rescue medications to 2-3 a week to avoid rebound headaches. She will keep a calendar of her headaches and follow-up in 6 months.   Thank you for allowing me to participate in her care.  Please do not hesitate to call for any questions or concerns.  The duration of this appointment visit was 27 minutes of face-to-face time with the  patient.  Greater than 50% of this time was spent in counseling, explanation of diagnosis, planning of further management, and coordination of care.   Tasha Ross, M.D.   CC: Harrietta Guardian FNP

## 2017-11-16 NOTE — Progress Notes (Signed)
Cardiology Office Note:    Date:  11/17/2017   ID:  YER CASTELLO, DOB 09/20/1975, MRN [id number]  PCP:  Dolan Amen, FNP  Cardiologist:  Dietrich Pates, MD  Neurologist:  Patrcia Dolly, MD  Referring MD: Dolan Amen, FNP   Chief Complaint  Patient presents with  . Follow-up    Cardiomyopathy    History of Present Illness:    Tasha Ross is a 42 y.o. female with non-compaction cardiomyopathy (non-ischemic cardiomyopathy).  EF was 45-50 on Echo in 2016.  A cardiac MRI confirmed non-compaction cardiomyopathy.  GXT was low risk in 03/2015.  A cardiopulmonary stress test in 12/2015 demonstrated a mild to moderate circulatory limitation due to CHF.  He EF on MRI was 55 and on last Echo in 2/19 it was 55.  She is not on angiotensin receptor blocker due to low blood pressure.  Last seen by Dr. Tenny Craw in 06/2017.  She had been to the emergency room prior to that with chest pain.  ECG during her visit to the emergency room demonstrated normal sinus rhythm, nonspecific ST-T wave changes.  Tasha Ross returns for follow up on her cardiomyopathy.  She is here alone.  She is doing well.  She denies chest discomfort, shortness of breath, syncope, orthopnea, PND or lower extremity swelling.  Prior CV studies:   The following studies were reviewed today:  Echo 06/19/17 EF 55, no RWMA, normal diastolic function, normal RVSF  CPX 12/28/15 Conclusion: Exercise testing with gas exchange demonstrates a mild functional impairment when compared to matched sedentary norms. Patient appears primarily circulatory limited with elevated VE/VCO2 slope and flat O2 pulse (given HR response was adequate). Patient's body habitus appears to be contributing to her exercise intolerance as well.  There is a mild to moderate circulatory limitation present due to HF.   Event Monitor 11/26/15 Sinus rhythm, sinus tachycardia  Occasional PVC Symptoms did not always correlate with signif elevated HR  GXT 03/23/15 No ST  segment changes with stress  Cardiac MRI 03/09/15 IMPRESSION: 1. Mild left ventricular dilatation with normal wall thickness and normal systolic function (LVEF = 55%) with no regional wall motion abnormalities.  There are pronounced trabeculations in the apical and mid segments with compacted to non-compacted ratio > 2.3:1 when measured in long axis views.  2. Normal right ventricular size, thickness and systolic function (LVEF = 53%) with no regional wall motion abnormalities.  3.  Mild mitral and trivial tricuspid regurgitation.  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 A standard CHF therapy is recommended.  Event monitor 01/25/15  Normal Sinus Rhythm and snius tachcyardia with heart rate ranging from 50 to 121  SVT up to 193 bpm  Rare PVC  Echo 01/19/15 EF 45-50%, diffuse HK  Past Medical History:  Diagnosis Date  . Cardiomyopathy (HCC)    a. Echo 01/2015: EF 45-50% with diffuse HK.  Marland Kitchen History of stress test    a. ETT 11/16:  normal  . Migraine   . Near syncope   . Snoring 04/06/2015  . SVT (supraventricular tachycardia) (HCC)    a. Seen on event monitor 01/2015.  Marland Kitchen Vertigo    Surgical Hx: The patient  has a past surgical history that includes Tubal ligation (Bilateral); Cystectomy; and Vaginal hysterectomy.   Current Medications: Current Meds  Medication Sig  . azelastine (ASTELIN) 0.1 % nasal spray Place 2 sprays into both nostrils daily.  . fluticasone (FLONASE) 50 MCG/ACT nasal spray  Place 2 sprays into both nostrils daily.  . metoprolol succinate (TOPROL-XL) 25 MG 24 hr tablet Take 0.5 tablets (12.5 mg total) by mouth daily.  . SUMAtriptan (IMITREX) 20 MG/ACT nasal spray Spray in one nostril. May repeat in 2 hours if headache persists or recurs. Do not use more than 3 times a week  . venlafaxine XR (EFFEXOR-XR) 37.5 MG 24 hr capsule Take 1 capsule at night for 1 month, then  increase to 2 capsules at night  . [DISCONTINUED] metoprolol succinate (TOPROL-XL) 25 MG 24 hr tablet TAKE ONE-HALF TABLET BY  MOUTH DAILY     Allergies:   Ibuprofen; Amitriptyline; Clindamycin; Sulfa antibiotics; Sulfur; and Sulfamethoxazole   Social History   Tobacco Use  . Smoking status: Never Smoker  . Smokeless tobacco: Never Used  Substance Use Topics  . Alcohol use: Yes    Alcohol/week: 0.0 oz    Comment: Rare - once or twice a month.  . Drug use: No     Family Hx: The patient's family history includes Cancer in her father; Diabetes in her unknown relative; Heart disease in her maternal aunt; Hypertension in her unknown relative; Lupus in her unknown relative. There is no history of Stroke, Fainting, Migraines, or Seizures.  ROS:   Please see the history of present illness.    ROS All other systems reviewed and are negative.   EKGs/Labs/Other Test Reviewed:    EKG:  EKG is not ordered today.    Recent Labs: 01/19/2017: TSH 0.802 06/17/2017: BUN 8; Creatinine, Ser 0.69; Hemoglobin 12.1; Platelets 328; Potassium 3.6; Sodium 138   Recent Lipid Panel Lab Results  Component Value Date/Time   CHOL 147 09/28/2015 12:17 PM   TRIG 48 09/28/2015 12:17 PM   HDL 61 09/28/2015 12:17 PM   CHOLHDL 2.4 09/28/2015 12:17 PM   LDLCALC 76 09/28/2015 12:17 PM    Physical Exam:    VS:  BP 110/80   Pulse 65   Ht 5\' 2"  (1.575 m)   Wt 151 lb 6.4 oz (68.7 kg)   SpO2 98%   BMI 27.69 kg/m     Wt Readings from Last 3 Encounters:  11/17/17 151 lb 6.4 oz (68.7 kg)  10/26/17 151 lb (68.5 kg)  06/19/17 151 lb (68.5 kg)     Physical Exam  Constitutional: She is oriented to person, place, and time. She appears well-developed and well-nourished. No distress.  HENT:  Head: Normocephalic and atraumatic.  Eyes: No scleral icterus.  Neck: Neck supple. No JVD present.  Cardiovascular: Normal rate, regular rhythm, S1 normal, S2 normal and normal heart sounds.  No murmur  heard. Pulmonary/Chest: Effort normal. She has no rales.  Abdominal: Soft. There is no hepatomegaly.  Musculoskeletal: She exhibits no edema.  Neurological: She is alert and oriented to person, place, and time.  Skin: Skin is warm and dry.  Psychiatric: She has a normal mood and affect.    ASSESSMENT & PLAN:    Noncompaction cardiomyopathy (HCC) MRI in 2016 was consistent with non-compaction cardiomyopathy with EF 55%.  Most recent echocardiogram in February 2019 demonstrates EF 55%.  She is intolerant of ACE inhibitors secondary to low blood pressure.  She is tolerating low-dose beta-blocker therapy with metoprolol succinate.  She does not demonstrate any evidence of volume excess or worsening symptoms.  Continue current management and follow-up with Dr. Tenny Craw in 6 months.   Dispo:  No follow-ups on file.   Medication Adjustments/Labs and Tests Ordered: Current medicines are reviewed at length with  the patient today.  Concerns regarding medicines are outlined above.  Tests Ordered: No orders of the defined types were placed in this encounter.  Medication Changes: Meds ordered this encounter  Medications  . metoprolol succinate (TOPROL-XL) 25 MG 24 hr tablet    Sig: Take 0.5 tablets (12.5 mg total) by mouth daily.    Dispense:  45 tablet    Refill:  3    Signed, Tereso Newcomer, PA-C  11/17/2017 10:05 AM    [facility] [address] [place], [place], [place]  [address] Phone: [phone]; Fax: [phone]

## 2017-11-17 ENCOUNTER — Encounter: Payer: Self-pay | Admitting: Physician Assistant

## 2017-11-17 ENCOUNTER — Ambulatory Visit (INDEPENDENT_AMBULATORY_CARE_PROVIDER_SITE_OTHER): Payer: BLUE CROSS/BLUE SHIELD | Admitting: Physician Assistant

## 2017-11-17 VITALS — BP 110/80 | HR 65 | Ht 62.0 in | Wt 151.4 lb

## 2017-11-17 DIAGNOSIS — I428 Other cardiomyopathies: Secondary | ICD-10-CM | POA: Diagnosis not present

## 2017-11-17 MED ORDER — METOPROLOL SUCCINATE ER 25 MG PO TB24: 12.5000 mg | ORAL_TABLET | Freq: Every day | ORAL | 3 refills | Status: DC

## 2017-11-17 NOTE — Patient Instructions (Signed)
Medication Instructions: Your physician recommends that you continue on your current medications as directed. Please refer to the Current Medication list given to you today.   Labwork: None Ordered  Procedures/Testing: None Ordered  Follow-Up: Your physician recommends that you schedule a follow-up appointment in: 6 months with Dr. Tenny Craw    Any Additional Special Instructions Will Be Listed Below (If Applicable).     If you need a refill on your cardiac medications before your next appointment, please call your pharmacy.

## 2018-05-06 ENCOUNTER — Encounter: Payer: Self-pay | Admitting: Physician Assistant

## 2018-06-01 NOTE — Progress Notes (Signed)
Cardiology Office Note:    Date:  06/02/2018   ID:  Tasha Ross, DOB 01/29/1976, MRN [id number]  PCP:  Dolan AmenBailey, Sarah M, FNP  Cardiologist:  Dietrich PatesPaula Ross, MD   Electrophysiologist:  None  Neurologist:  Patrcia DollyKaren Aquino, MD  Referring MD: Dolan AmenBailey, Sarah M, FNP   Chief Complaint  Patient presents with  . Follow-up    cardiomyopathy     History of Present Illness:    Tasha Ross is a 43 y.o. female with non-compaction cardiomyopathy (non-ischemic cardiomyopathy).  EF was 45-50 on Echo in 2016.  A cardiac MRI confirmed non-compaction cardiomyopathy.  GXT was low risk in 03/2015.  A cardiopulmonary stress test in 12/2015 demonstrated a mild to moderate circulatory limitation due to CHF.  Her EF on MRI was 55 and on last Echo in 2/19 it was 55.  She is not on angiotensin receptor blocker due to low blood pressure. She was last seen in 11/2017.     Ms. Tasha Ross returns for follow up.  She is here alone. She has not been short of breath or had chest pain.  She denies syncope. But, she has had some palpitations recently and did feel dizzy on one occasion.  This seemed to be assoc with a fast heartbeat.  She denies paroxysmal nocturnal dyspnea, leg swelling.    Prior CV studies:   The following studies were reviewed today:  Echo 06/19/17 EF 55, no RWMA, normal diastolic function, normal RVSF  CPX 12/28/15 Conclusion: Exercise testing with gas exchange demonstrates a mild functional impairment when compared to matched sedentary norms. Patient appears primarily circulatory limited with elevated VE/VCO2 slope and flat O2 pulse (given HR response was adequate). Patient's body habitus appears to be contributing to her exercise intolerance as well.  There is a mild to moderate circulatory limitation present due to HF.   Event Monitor 11/26/15 Sinus rhythm, sinus tachycardia Occasional PVC Symptoms did not always correlate with signif elevated HR  GXT 03/23/15 No ST segment changes with  stress  Cardiac MRI 03/09/15 IMPRESSION: 1. Mild left ventricular dilatation with normal wall thickness and normal systolic function (LVEF = 55%) with no regional wall motion abnormalities.  There are pronounced trabeculations in the apical and mid segments with compacted to non-compacted ratio > 2.3:1 when measured in long axis views.  2. Normal right ventricular size, thickness and systolic function (LVEF = 53%) with no regional wall motion abnormalities.  3. Mild mitral and trivial tricuspid regurgitation.  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 A standard CHF therapy is recommended.  Event monitor 01/25/15  Normal Sinus Rhythm and snius tachcyardia with heart rate ranging from 50 to 121  SVT up to 193 bpm  Rare PVC  Echo 01/19/15 EF 45-50%, diffuse HK  Past Medical History:  Diagnosis Date  . Cardiomyopathy (HCC)    a. Echo 01/2015: EF 45-50% with diffuse HK.  Marland Kitchen. History of stress test    a. ETT 11/16:  normal  . Migraine   . Near syncope   . Snoring 04/06/2015  . SVT (supraventricular tachycardia) (HCC)    a. Seen on event monitor 01/2015.  Marland Kitchen. Vertigo    Surgical Hx: The patient  has a past surgical history that includes Tubal ligation (Bilateral); Cystectomy; and Vaginal hysterectomy.   Current Medications: Current Meds  Medication Sig  . azelastine (ASTELIN) 0.1 % nasal spray Place 2 sprays into both nostrils daily.  . fluticasone (FLONASE) 50 MCG/ACT nasal spray  Place 2 sprays into both nostrils daily.  . SUMAtriptan (IMITREX) 20 MG/ACT nasal spray Spray in one nostril. May repeat in 2 hours if headache persists or recurs. Do not use more than 3 times a week  . traMADol (ULTRAM) 50 MG tablet Take 1 tablet as needed for severe migraine. Do not take more than 3 a week  . venlafaxine XR (EFFEXOR-XR) 37.5 MG 24 hr capsule Take 1 capsule at night for 1 month, then increase to 2  capsules at night  . [DISCONTINUED] acyclovir (ZOVIRAX) 800 MG tablet Take 1 tablet (800 mg total) by mouth 3 (three) times daily.  . [DISCONTINUED] acyclovir ointment (ZOVIRAX) 5 % Apply 1 application topically every 3 (three) hours.  . [DISCONTINUED] metoprolol succinate (TOPROL-XL) 25 MG 24 hr tablet Take 0.5 tablets (12.5 mg total) by mouth daily.     Allergies:   Ibuprofen; Amitriptyline; Clindamycin; Sulfa antibiotics; Sulfur; and Sulfamethoxazole   Social History   Tobacco Use  . Smoking status: Never Smoker  . Smokeless tobacco: Never Used  Substance Use Topics  . Alcohol use: Yes    Alcohol/week: 0.0 standard drinks    Comment: Rare - once or twice a month.  . Drug use: No     Family Hx: The patient's family history includes Cancer in her father; Diabetes in an other family member; Heart disease in her maternal aunt; Hypertension in an other family member; Lupus in an other family member. There is no history of Stroke, Fainting, Migraines, or Seizures.  ROS:   Please see the history of present illness.    ROS All other systems reviewed and are negative.   EKGs/Labs/Other Test Reviewed:    EKG:  EKG is  ordered today.  The ekg ordered today demonstrates normal sinus rhythm, HR 84, normal axis, QTc 420  Recent Labs: 06/17/2017: Hemoglobin 12.1; Platelets 328 06/02/2018: BUN 14; Creatinine, Ser 0.65; Potassium 4.6; Sodium 139; TSH 1.480   Recent Lipid Panel Lab Results  Component Value Date/Time   CHOL 147 09/28/2015 12:17 PM   TRIG 48 09/28/2015 12:17 PM   HDL 61 09/28/2015 12:17 PM   CHOLHDL 2.4 09/28/2015 12:17 PM   LDLCALC 76 09/28/2015 12:17 PM     Physical Exam:    VS:  BP 122/78   Ht 5\' 2"  (1.575 m)   Wt 154 lb (69.9 kg)   SpO2 99%   BMI 28.17 kg/m     Wt Readings from Last 3 Encounters:  06/02/18 154 lb (69.9 kg)  11/17/17 151 lb 6.4 oz (68.7 kg)  10/26/17 151 lb (68.5 kg)     Physical Exam  Constitutional: She is oriented to person, place,  and time. She appears well-developed and well-nourished. No distress.  HENT:  Head: Normocephalic and atraumatic.  Eyes: No scleral icterus.  Neck: Neck supple. No JVD present. Carotid bruit is not present. No thyromegaly present.  Cardiovascular: Normal rate, regular rhythm, S1 normal, S2 normal and normal heart sounds.  No murmur heard. Pulmonary/Chest: Effort normal. She has no rales.  Abdominal: Soft. There is no hepatomegaly.  Musculoskeletal:        General: No edema.  Lymphadenopathy:    She has no cervical adenopathy.  Neurological: She is alert and oriented to person, place, and time.  Skin: Skin is warm and dry.  Psychiatric: She has a normal mood and affect.    ASSESSMENT & PLAN:    Noncompaction cardiomyopathy (HCC) MRI in 2016 was consistent with non-compaction cardiomyopathy with EF 55%.  Most recent echocardiogram in February 2019 demonstrates EF 55%.  She is on a low dose beta-blocker and has not been able to tolerate angiotensin receptor blocker due to low blood pressure. She has had some palpitations recently.  I have suggested we update her echocardiogram and get a heart monitor to rule out NSVT.  I think she should be able to tolerate Toprol XL 25 mg QD.  -Obtain echocardiogram   -Obtain Long Term Monitor 3-14 days  -BMET, TSH today  -Increase Toprol XL to 25 mg QD   -FU with Dr. Tenny Craw in 3 mos  Palpitations Obtain labs, echo and monitor as noted. Increase beta-blocker as noted.   Migraine HAs Continue follow up with neurology.   Dispo:  Return in about 3 months (around 09/01/2018) for Routine Follow Up, w/ Dr. Tenny Craw.   Medication Adjustments/Labs and Tests Ordered: Current medicines are reviewed at length with the patient today.  Concerns regarding medicines are outlined above.  Tests Ordered: Orders Placed This Encounter  Procedures  . Basic metabolic panel  . TSH  . LONG TERM MONITOR (3-14 DAYS)  . EKG 12-Lead  . ECHOCARDIOGRAM COMPLETE   Medication  Changes: Meds ordered this encounter  Medications  . metoprolol succinate (TOPROL XL) 25 MG 24 hr tablet    Sig: Take 1 tablet (25 mg total) by mouth daily.    Dispense:  30 tablet    Refill:  [address], Tereso Newcomer, PA-C  06/02/2018 10:10 PM    [facility] [address] [place], [place], [place]  [address] Phone: [phone]; Fax: [phone]

## 2018-06-02 ENCOUNTER — Encounter: Payer: Self-pay | Admitting: Physician Assistant

## 2018-06-02 ENCOUNTER — Ambulatory Visit (INDEPENDENT_AMBULATORY_CARE_PROVIDER_SITE_OTHER): Payer: BLUE CROSS/BLUE SHIELD | Admitting: Physician Assistant

## 2018-06-02 VITALS — BP 122/78 | Ht 62.0 in | Wt 154.0 lb

## 2018-06-02 DIAGNOSIS — I428 Other cardiomyopathies: Secondary | ICD-10-CM

## 2018-06-02 DIAGNOSIS — R002 Palpitations: Secondary | ICD-10-CM | POA: Diagnosis not present

## 2018-06-02 DIAGNOSIS — Z8669 Personal history of other diseases of the nervous system and sense organs: Secondary | ICD-10-CM | POA: Diagnosis not present

## 2018-06-02 LAB — BASIC METABOLIC PANEL
BUN/Creatinine Ratio: 22 (ref 9–23)
BUN: 14 mg/dL (ref 6–24)
CO2: 21 mmol/L (ref 20–29)
CREATININE: 0.65 mg/dL (ref 0.57–1.00)
Calcium: 9.1 mg/dL (ref 8.7–10.2)
Chloride: 103 mmol/L (ref 96–106)
GFR calc Af Amer: 128 mL/min/{1.73_m2} (ref >59)
GFR calc non Af Amer: 111 mL/min/{1.73_m2} (ref >59)
Glucose: 89 mg/dL (ref 65–99)
Potassium: 4.6 mmol/L (ref 3.5–5.2)
SODIUM: 139 mmol/L (ref 134–144)

## 2018-06-02 LAB — TSH: TSH: 1.48 u[IU]/mL (ref 0.450–4.500)

## 2018-06-02 MED ORDER — METOPROLOL SUCCINATE ER 25 MG PO TB24: 25.0000 mg | ORAL_TABLET | Freq: Every day | ORAL | 11 refills | Status: DC

## 2018-06-02 NOTE — Patient Instructions (Signed)
Medication Instructions:  Your physician has recommended you make the following change in your medication: 1. INCREASE TOPROL XL TO 25 MG DAILY.   If you need a refill on your cardiac medications before your next appointment, please call your pharmacy.   Lab work: TODAY: BMET, TSH  If you have labs (blood work) drawn today and your tests are completely normal, you will receive your results only by: Marland Kitchen MyChart Message (if you have MyChart) OR . A paper copy in the mail If you have any lab test that is abnormal or we need to change your treatment, we will call you to review the results.  Testing/Procedures: Your physician has requested that you have an echocardiogram. Echocardiography is a painless test that uses sound waves to create images of your heart. It provides your doctor with information about the size and shape of your heart and how well your heart's chambers and valves are working. This procedure takes approximately one hour. There are no restrictions for this procedure.  Your physician has recommended that you wear an LONG TERM event monitor. Event monitors are medical devices that record the heart's electrical activity. Doctors most often [place] these monitors to diagnose arrhythmias. Arrhythmias are problems with the speed or rhythm of the heartbeat. The monitor is a small, portable device. You can wear one while you do your normal daily activities. This is usually used to diagnose what is causing palpitations/syncope (passing out).   Follow-Up: At [facility], you and your health needs are our priority.  As part of our continuing mission to provide you with exceptional heart care, we have created designated Provider Care Teams.  These Care Teams include your primary Cardiologist (physician) and Advanced Practice Providers (APPs -  Physician Assistants and Nurse Practitioners) who all work together to provide you with the care you need, when you need it. . You will need a follow up  appointment in:  3 months. You may see Dietrich Pates, MD or one of the following Advanced Practice Providers on your designated Care Team: . Tereso Newcomer, PA-C . Vin Bhagat, PA-C . Berton Bon, NP  Any Other Special Instructions Will Be Listed Below (If Applicable).

## 2018-06-09 ENCOUNTER — Other Ambulatory Visit: Payer: Self-pay

## 2018-06-09 ENCOUNTER — Encounter: Payer: Self-pay | Admitting: Neurology

## 2018-06-09 ENCOUNTER — Ambulatory Visit (HOSPITAL_COMMUNITY): Payer: BLUE CROSS/BLUE SHIELD

## 2018-06-09 ENCOUNTER — Ambulatory Visit: Payer: BLUE CROSS/BLUE SHIELD

## 2018-06-09 ENCOUNTER — Ambulatory Visit (INDEPENDENT_AMBULATORY_CARE_PROVIDER_SITE_OTHER): Payer: BLUE CROSS/BLUE SHIELD | Admitting: Neurology

## 2018-06-09 VITALS — BP 108/66 | HR 82 | Ht 62.0 in | Wt 153.0 lb

## 2018-06-09 DIAGNOSIS — G43109 Migraine with aura, not intractable, without status migrainosus: Secondary | ICD-10-CM | POA: Diagnosis not present

## 2018-06-09 MED ORDER — TRAMADOL HCL 50 MG PO TABS: ORAL_TABLET | ORAL | 5 refills | Status: DC

## 2018-06-09 MED ORDER — VENLAFAXINE HCL ER 37.5 MG PO CP24: ORAL_CAPSULE | ORAL | 11 refills | Status: DC

## 2018-06-09 NOTE — Patient Instructions (Signed)
1. See how you feel on the 1 capsule of Effexor, if still a lot of headaches, go back to 2 capsules every night  2. Take Tramadol as needed for severe headaches, do not take more than 2-3 a week  3. Follow-up in 6 months, call for any changes

## 2018-06-09 NOTE — Progress Notes (Signed)
NEUROLOGY FOLLOW UP OFFICE NOTE  Tasha Ross [id number]  DOB: Jan 25, 1976  HISTORY OF PRESENT ILLNESS: I had the pleasure of seeing Terrea Krome in follow-up in the neurology clinic on 06/09/2018.  The patient was last seen 7 months ago for vertigo, headaches, and syncope. MRI brain unremarkable. She had an abnormal echo and was referred to Cardiology, who did a holter monitor with note of tachycardia with heart rates up to 180-190 bpm. It was felt that amitriptyline was the potential cause. 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 She denies any further syncopal episodes since April 2018.   On her last visit, she reported stopping Zonisamide and Effexor for headache prophylaxis. We agreed to hold off on medications since she had been doing very well with rare migraines that lasted 2 weeks. She did well migraine-free for 6 months or so, until last week when migraines and dizziness again recurred. She reports feeling lightheaded, no syncopal episodes. Headaches are 6/10 in intensity, no associated nausea/vomiting, vision changes. She restarted Effexor 37.5mg  1 cap qhs. She has not been taking any prn medication, she ran out of Tramadol. She does not a lot of stress recently with getting her career going and her son joining the [employer]. She denies any focal numbness/tingling/weakness, no falls.   HPI 01/17/2015: This is a 43 yo RH woman presenting with vertigo, headaches, and episodes of passing out that started in 2009 or 2010. She had seen neurologist Dr. Shane Crutch in 2012 while living in [place], reporting that she is having the exact same symptoms she had, they had gotten under control while she was seeing him, with no episodes for a year, until January 2016 as she was transitioning for her move to [facility] last March 2016. She reports migraines followed a couple of days later by vertigo. She  would brace herself, trying to sit still, but would lose consciousness. She has fallen to the floor at work. Sometimes she can brace herself, other times there is no warning. Symptoms are brief, she would wake up on the floor. She was admitted to [facility] last 10/22/14 for near syncope, she reported right-sided weakness with slurred speech, that improved during transport. Glucose noted to be 72. She had an MRI brain without contrast with no acute intracranial changes. MRA brain reported a 1.82mm bulbous appearance of the distal left PICA branches in the region of the inferior cerebellar vermis. It may represent a vascular loop, however a small aneurysm is not entirely excluded. 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 Records from her previous neurologist were reviewed. He was concerned that dizziness and vertigo were not physiological in nature. He had done an MRI/MRA brain reported as normal as well. TSH, RPR, CBC normal. B12 low normal at 278, she had received replacement treatment. For migraine prophylaxis, she was started on amitriptyline in 2012, then switched to Topamax in 2013. She reports migraines are retro-orbital, sometimes dull or sharp pain, with phonophobia, nausea. She recalls taking amitriptyline and Topamax, and states these were stopped because the episodes stopped. She recalls taking Imitrex, Fioricet, Maxalt which would help initially. She feels the amitriptyline and citalopram in the past helped put her in a calm mode. She has been having more frequent migraines occurring twice a week, lasting 3-4 hours. Ibuprofen does not help. The vertigo occurs a couple of days after a migraine, with spinning sensation lasting up  to a week. She usually gets less than 6 hours of sleep, feeling "real agitated." She reports the last syncopal episode was in June. When asked about stress, she does indicate that she has been working for [employer] since 2014, but had  to move to a different location with her move in January.   She had a syncopal episode at work last 08/19/16 and has had a headache, neck pain, and seeing floaters since then. 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 No convulsive activity seen on video. She came to and recalls feeling sick to her stomach and unable to talk, with her words not coming out clear. She reports another syncopal episode in March on the way to work, she felt a pressure in her chest. She was recently treated with antibiotic for congestion, but has started feeling ill again. She wonders if weather changes are a trigger, she has become more hypersensitive to smells since Spring started.   Prior preventative medications: amitriptyline, topamax, Toprol, zonisamide, citalopram Prior rescue medications: imitrex, relpax, maxalt, zomig, fioricet  PAST MEDICAL HISTORY: Past Medical History:  Diagnosis Date  . Cardiomyopathy (HCC)    a. Echo 01/2015: EF 45-50% with diffuse HK.  Marland Kitchen. History of stress test    a. ETT 11/16:  normal  . Migraine   . Near syncope   . Snoring 04/06/2015  . SVT (supraventricular tachycardia) (HCC)    a. Seen on event monitor 01/2015.  Marland Kitchen. Vertigo     MEDICATIONS: Current Outpatient Medications on File Prior to Visit  Medication Sig Dispense Refill  . azelastine (ASTELIN) 0.1 % nasal spray Place 2 sprays into both nostrils daily.  11  . fluticasone (FLONASE) 50 MCG/ACT nasal spray Place 2 sprays into both nostrils daily.    . metoprolol succinate (TOPROL XL) 25 MG 24 hr tablet Take 1 tablet (25 mg total) by mouth daily. 30 tablet 11  . SUMAtriptan (IMITREX) 20 MG/ACT nasal spray Spray in one nostril. May repeat in 2 hours if headache persists or recurs. Do not use more than 3 times a week 1 Inhaler 5  . traMADol (ULTRAM) 50 MG tablet Take 1 tablet as needed for severe migraine. Do not take more than 3 a week  10 tablet 5  . venlafaxine XR (EFFEXOR-XR) 37.5 MG 24 hr capsule Take 1 capsule at night for 1 month, then increase to 2 capsules at night 60 capsule 6   No current facility-administered medications on file prior to visit.     ALLERGIES: Allergies  Allergen Reactions  . Ibuprofen Other (See Comments) and Rash    Per Neurologist request Per Neurologist request  . Amitriptyline Other (See Comments) and Rash    Causing headaches and head pains Causing headaches and head pains  . Clindamycin Rash  . Sulfa Antibiotics Swelling and Rash  . Sulfur Swelling  . Sulfamethoxazole Swelling    FAMILY HISTORY: Family History  Problem Relation Age of Onset  . Cancer Father   . Hypertension Other   . Lupus Other   . Diabetes Other   . Heart disease Maternal Aunt        Patient does not know details  . Stroke Neg Hx   . Fainting Neg Hx   . Migraines Neg Hx   . Seizures Neg Hx     SOCIAL HISTORY: Social History   Socioeconomic History  . Marital status:  Single    Spouse name: Not on file  . Number of children: 4  . Years of education: Not on file  . Highest education level: Not on file  Occupational History  . Not on file  Social Needs  . Financial resource strain: Not on file  . Food insecurity:    Worry: Not on file    Inability: Not on file  . Transportation needs:    Medical: Not on file    Non-medical: Not on file  Tobacco Use  . Smoking status: Never Smoker  . Smokeless tobacco: Never Used  Substance and Sexual Activity  . Alcohol use: Yes    Alcohol/week: 0.0 standard drinks    Comment: Rare - once or twice a month.  . Drug use: No  . Sexual activity: Not on file  Lifestyle  . Physical activity:    Days per week: Not on file    Minutes per session: Not on file  . Stress: Not on file  Relationships  . Social connections:    Talks on phone: Not on file    Gets together: Not on file    Attends religious service: Not on file    Active member of club or  organization: Not on file    Attends meetings of clubs or organizations: Not on file    Relationship status: Not on file  . Intimate partner violence:    Fear of current or ex partner: Not on file    Emotionally abused: Not on file    Physically abused: Not on file    Forced sexual activity: Not on file  Other Topics Concern  . Not on file  Social History Narrative   Lives with 3 of her sons.  Has 4 sons.  Works for [employer].  Education: some college.    REVIEW OF SYSTEMS: Constitutional: No fevers, chills, or sweats, no generalized fatigue, change in appetite Eyes: No visual changes, double vision, eye pain Ear, nose and throat: No hearing loss, ear pain, nasal congestion, sore throat Cardiovascular: No chest pain, palpitations Respiratory:  No shortness of breath at rest or with exertion, wheezes GastrointestinaI: No nausea, vomiting, diarrhea, abdominal pain, fecal incontinence Genitourinary:  No dysuria, urinary retention or frequency Musculoskeletal:  No neck pain, +back pain Integumentary: No rash, pruritus, skin lesions Neurological: as above Psychiatric: No depression, insomnia, no anxiety Endocrine: No palpitations, fatigue, diaphoresis, mood swings, change in appetite, change in weight, increased thirst Hematologic/Lymphatic:  No anemia, purpura, petechiae. Allergic/Immunologic: no itchy/runny eyes, nasal congestion, recent allergic reactions, rashes  PHYSICAL EXAM: Vitals:   06/09/18 0825  BP: 108/66  Pulse: 82  SpO2: 100%   General: No acute distress Head:  Normocephalic/atraumatic Neck: supple, no paraspinal tenderness, full range of motion Heart:  Regular rate and rhythm Lungs:  Clear to auscultation bilaterally Back: No paraspinal tenderness Skin/Extremities: No rash, no edema Neurological Exam: alert and oriented to person, place, and time. No aphasia or dysarthria. Fund of knowledge is appropriate.  Recent and remote memory are intact.  Attention and  concentration are normal.    Able to name objects and repeat phrases. Cranial nerves: Pupils equal, round, reactive to light.  Extraocular movements intact with no nystagmus. Visual fields full. Facial sensation intact. No facial asymmetry. Tongue, uvula, palate midline.  Motor: Bulk and tone normal, muscle strength 5/5 throughout with no pronator drift.  Sensation to light touch intact.  No extinction to double simultaneous stimulation.  Deep tendon reflexes 2+ throughout, toes downgoing.  Finger to nose testing intact.  Gait narrow-based and steady, able to tandem walk adequately.  Romberg negative.  IMPRESSION: This is a 43 yo RH woman with a history of headaches, dizziness, and syncope since 2009 or 2010, symptoms had quieted down, then recurred at the beginning of 2016. On review of records, her previous neurologist was concerned dizziness and vertigo were not physiologic in nature. She did not tolerate amitriptyline for headache prophylaxis, in addition, Cardiology felt this was causing SVT on holter monitor. Amitriptyline discontinued, she has been taking Toprol for SVT. No further syncopal episodes since April 2018. She has long periods of being headache-free, did well off medication for more than 6 months until headaches and dizziness recurred last week. She restarted Effexor 37.5mg  qhs, we discussed monitoring symptoms, if no improvement, increase back to 2 caps qhs. Refills for prn Tramadol were sent. She knows to minimize rescue medications to 2-3 a week to avoid rebound headaches. She will continue migraine calendar and follow-up in 6 months.   Thank you for allowing me to participate in her care.  Please do not hesitate to call for any questions or concerns.  The duration of this appointment visit was 25 minutes of face-to-face time with the patient.  Greater than 50% of this time was spent in counseling, explanation of diagnosis, planning of further management, and coordination of  care.   Patrcia Dolly, M.D.   CC: Harrietta Guardian FNP

## 2018-06-17 ENCOUNTER — Ambulatory Visit (HOSPITAL_COMMUNITY): Payer: BLUE CROSS/BLUE SHIELD | Attending: Cardiology

## 2018-06-17 ENCOUNTER — Ambulatory Visit (INDEPENDENT_AMBULATORY_CARE_PROVIDER_SITE_OTHER): Payer: BLUE CROSS/BLUE SHIELD

## 2018-06-17 ENCOUNTER — Encounter: Payer: Self-pay | Admitting: Physician Assistant

## 2018-06-17 DIAGNOSIS — R002 Palpitations: Secondary | ICD-10-CM

## 2018-06-17 DIAGNOSIS — I428 Other cardiomyopathies: Secondary | ICD-10-CM

## 2018-07-21 DIAGNOSIS — R002 Palpitations: Secondary | ICD-10-CM | POA: Diagnosis not present

## 2018-08-04 ENCOUNTER — Telehealth: Payer: Self-pay | Admitting: Physician Assistant

## 2018-08-04 ENCOUNTER — Telehealth: Payer: Self-pay | Admitting: Internal Medicine

## 2018-08-04 NOTE — Telephone Encounter (Signed)
° °  Please call patient with LONG TERM MONITOR results

## 2018-08-04 NOTE — Telephone Encounter (Signed)
I cannot write for hand sanitizer I would recomm using gloves at work I would also recomm she use a mask at work given close person to person contact 

## 2018-08-04 NOTE — Telephone Encounter (Signed)
PRIMARY CARDIOLOGIST MESSAGED ABOUT MATTER

## 2018-08-05 ENCOUNTER — Telehealth: Payer: Self-pay | Admitting: *Deleted

## 2018-08-05 NOTE — Telephone Encounter (Deleted)
I cannot write for hand sanitizer I would recomm using gloves at work I would also recomm she use a mask at work given close person to person contact

## 2018-08-05 NOTE — Telephone Encounter (Signed)
Called patient and informed per Dr. Tenny Craw:  I cannot write for hand sanitizer I would recomm using gloves at work I would also recomm she use a mask at work given close person to person contact  Adv pt to contact primary care physician to see if possible they could provide supplies or prescription.

## 2018-08-05 NOTE — Telephone Encounter (Signed)
-----   Message from Tasha Ross, [place] sent at 08/04/2018 10:09 AM EDT ----- Regarding: PT WOULD LIKE RX FOR HAND SANITIZER DUE TO HER HEALTH CONDITIONS. I SPOKE WITH PT THIS MORNING WHO RAISED CONCERNS WITH CONSTANTLY BEING EXPOSED  OF COVID-19 AT WORK ([employer]).  DUE TO HER HEALTH CONDITIONS,WHICH SHE SAID DR ROSS WOULD BETTER UNDERSTAND SHOULD  HELP HER GET AN RX FOR HAND SANITIZER.FROM PHARMACY.  THERE ARE NONE IN STORES AND ALL SHE HAS IS THE POCKET SIZE ONE. SHE'S CONSTAN LY STATED SHE WANTS TO BE SAFE.  PT WAS TOLD THAT PROVIDER WILL BE REACHED AND CONTACT HER  BACK ONCE A RECOMMENDATION HAS BEEN GIVEN.

## 2018-08-17 ENCOUNTER — Other Ambulatory Visit: Payer: Self-pay | Admitting: Neurology

## 2018-08-20 DIAGNOSIS — Z0000 Encounter for general adult medical examination without abnormal findings: Secondary | ICD-10-CM | POA: Diagnosis not present

## 2018-08-20 DIAGNOSIS — B373 Candidiasis of vulva and vagina: Secondary | ICD-10-CM | POA: Diagnosis not present

## 2018-08-20 DIAGNOSIS — Z1239 Encounter for other screening for malignant neoplasm of breast: Secondary | ICD-10-CM | POA: Diagnosis not present

## 2018-08-20 DIAGNOSIS — G43109 Migraine with aura, not intractable, without status migrainosus: Secondary | ICD-10-CM | POA: Diagnosis not present

## 2018-08-20 DIAGNOSIS — T7840XA Allergy, unspecified, initial encounter: Secondary | ICD-10-CM | POA: Insufficient documentation

## 2018-09-04 ENCOUNTER — Other Ambulatory Visit: Payer: Self-pay

## 2018-09-04 ENCOUNTER — Emergency Department (HOSPITAL_COMMUNITY)
Admission: EM | Admit: 2018-09-04 | Discharge: 2018-09-04 | Disposition: A | Discharge status: Home / Self Care | Payer: BLUE CROSS/BLUE SHIELD | Attending: Emergency Medicine | Admitting: Emergency Medicine

## 2018-09-04 ENCOUNTER — Encounter (HOSPITAL_COMMUNITY): Payer: Self-pay | Admitting: *Deleted

## 2018-09-04 DIAGNOSIS — G43909 Migraine, unspecified, not intractable, without status migrainosus: Secondary | ICD-10-CM

## 2018-09-04 DIAGNOSIS — Z20828 Contact with and (suspected) exposure to other viral communicable diseases: Secondary | ICD-10-CM | POA: Diagnosis not present

## 2018-09-04 DIAGNOSIS — Z79899 Other long term (current) drug therapy: Secondary | ICD-10-CM | POA: Insufficient documentation

## 2018-09-04 DIAGNOSIS — R51 Headache: Secondary | ICD-10-CM | POA: Diagnosis not present

## 2018-09-04 MED ORDER — METOCLOPRAMIDE HCL 5 MG/ML IJ SOLN
10.0000 mg | Freq: Once | INTRAMUSCULAR | Status: AC
  Administered 2018-09-04: 10 mg via INTRAMUSCULAR
  Filled 2018-09-04: qty 2

## 2018-09-04 MED ORDER — DIPHENHYDRAMINE HCL 50 MG/ML IJ SOLN
25.0000 mg | Freq: Once | INTRAMUSCULAR | Status: AC
  Administered 2018-09-04: 25 mg via INTRAMUSCULAR
  Filled 2018-09-04: qty 1

## 2018-09-04 NOTE — ED Triage Notes (Signed)
PT reports Ha yesterday and had to leave work.  Pt took tramadol at home and HA with some relief. Lt side neck pain.

## 2018-09-04 NOTE — ED Provider Notes (Signed)
[facility] EMERGENCY DEPARTMENT Provider Note   CSN: [id number] Arrival date & time: 09/04/18  1201    History   Chief Complaint Chief Complaint  Patient presents with  . Nausea  . Headache    HPI Tasha Ross is a 43 y.o. female.     HPI Reports that she is having a headache that is aching in quality.  Behind her forehead and concentrated somewhat to the left side behind her ear.  She reports she has nausea as well.  She reports it is typical for migraine headache.  Patient had to leave work yesterday because it was really bothering her.  She reports she went home and tried some tramadol and went to sleep.  This temporarily improved but today she still has a headache.  She called her doctor and they advised to come to the emergency department for evaluation.  Patient advises that her doctor was concerned she might need coronavirus testing.  Patient reports she has had a slight amount of diarrhea and some nausea.  She has had no coughing, no shortness of breath, no chest pain.  She reports she has felt a little bit of a hot cold chills but no fever.  No sore throat.  She reports she had a mild amount of nasal drainage that she thought was probably sinuses. Past Medical History:  Diagnosis Date  . Cardiomyopathy (HCC)    a. Echo 01/2015: EF 45-50% with diffuse HK. // Echo 06/2018: EF 55, no RWMA, prominent apical trabeculation  . History of stress test    a. ETT 11/16:  normal  . Migraine   . Near syncope   . Snoring 04/06/2015  . SVT (supraventricular tachycardia) (HCC)    a. Seen on event monitor 01/2015.  Marland Kitchen Vertigo     Patient Active Problem List   Diagnosis Date Noted  . Insomnia 04/19/2015  . Snoring 04/06/2015  . Noncompaction cardiomyopathy (HCC)   . Head revolving around 02/06/2015  . Complicated migraine 01/17/2015  . Faintness 01/17/2015    Past Surgical History:  Procedure Laterality Date  . CYSTECTOMY     from ovaries  . TUBAL LIGATION  Bilateral   . VAGINAL HYSTERECTOMY       OB History   No obstetric history on file.      Home Medications    Prior to Admission medications   Medication Sig Start Date End Date Taking? Authorizing Provider  azelastine (ASTELIN) 0.1 % nasal spray Place 2 sprays into both nostrils daily. 05/31/16   [provider]  fluticasone (FLONASE) 50 MCG/ACT nasal spray Place 2 sprays into both nostrils daily.    [provider]  metoprolol succinate (TOPROL XL) 25 MG 24 hr tablet Take 1 tablet (25 mg total) by mouth daily. 06/02/18   Tereso Newcomer T, PA-C  SUMAtriptan (IMITREX) 20 MG/ACT nasal spray Spray in one nostril. May repeat in 2 hours if headache persists or recurs. Do not use more than 3 times a week 10/26/17   Van Clines, MD  traMADol Janean Sark) 50 MG tablet Take 1 tablet as needed for severe migraine. Do not take more than 3 a week 06/09/18   Van Clines, MD  venlafaxine XR (EFFEXOR-XR) 37.5 MG 24 hr capsule Take 1 capsule at night for 1 month, then increase to 2 capsules at night 06/09/18   Van Clines, MD  zolmitriptan (ZOMIG-ZMT) 2.5 MG disintegrating tablet TAKE ONE TABLET (2.5 MG DOSE) BY MOUTH AS NEEDED FOR  MIGRAINE (HEADACHE ONSET). 08/17/18   Van Clines, MD    Family History Family History  Problem Relation Age of Onset  . Cancer Father   . Hypertension Other   . Lupus Other   . Diabetes Other   . Heart disease Maternal Aunt        Patient does not know details  . Stroke Neg Hx   . Fainting Neg Hx   . Migraines Neg Hx   . Seizures Neg Hx     Social History Social History   Tobacco Use  . Smoking status: Never Smoker  . Smokeless tobacco: Never Used  Substance Use Topics  . Alcohol use: Yes    Alcohol/week: 0.0 standard drinks    Comment: Rare - once or twice a month.  . Drug use: No     Allergies   Ibuprofen; Amitriptyline; Clindamycin; Sulfa antibiotics; Sulfur; and Sulfamethoxazole   Review of Systems Review of Systems 10  Systems reviewed and are negative for acute change except as noted in the HPI.  Physical Exam Updated Vital Signs BP 123/86 (BP Location: Right Arm)   Pulse 93   Temp 98.3 F (36.8 C) (Oral)   Resp 16   Ht 5\' 2"  (1.575 m)   Wt 70.3 kg   SpO2 100%   BMI 28.35 kg/m   Physical Exam Constitutional:      Appearance: Normal appearance.     Comments: Patient is alert and clinically well in appearance.  He shows no signs of distress.  Well-nourished well-developed.  HENT:     Right Ear: Tympanic membrane normal.     Left Ear: Tympanic membrane normal.     Nose: Nose normal.     Mouth/Throat:     Mouth: Mucous membranes are moist.     Pharynx: Oropharynx is clear.     Comments: Dentition is in excellent condition. Eyes:     Extraocular Movements: Extraocular movements intact.     Pupils: Pupils are equal, round, and reactive to light.  Neck:     Musculoskeletal: Normal range of motion and neck supple.  Cardiovascular:     Rate and Rhythm: Normal rate and regular rhythm.  Pulmonary:     Effort: Pulmonary effort is normal.     Breath sounds: Normal breath sounds.  Abdominal:     General: There is no distension.     Palpations: Abdomen is soft.     Tenderness: There is no abdominal tenderness.  Musculoskeletal: Normal range of motion.  Lymphadenopathy:     Cervical: No cervical adenopathy.  Skin:    General: Skin is warm and dry.  Neurological:     General: No focal deficit present.     Mental Status: She is alert and oriented to person, place, and time.     Cranial Nerves: No cranial nerve deficit.     Coordination: Coordination normal.     Gait: Gait normal.      ED Treatments / Results  Labs (all labs ordered are listed, but only abnormal results are displayed) Labs Reviewed - No data to display  EKG None  Radiology No results found.  Procedures Procedures (including critical care time)  Medications Ordered in ED Medications  metoCLOPramide (REGLAN)  injection 10 mg (10 mg Intramuscular Given 09/04/18 1236)  diphenhydrAMINE (BENADRYL) injection 25 mg (25 mg Intramuscular Given 09/04/18 1236)     Initial Impression / Assessment and Plan / ED Course  I have reviewed the triage vital signs and the nursing  notes.  Pertinent labs & imaging results that were available during my care of the patient were reviewed by me and considered in my medical decision making (see chart for details).       Recheck: Patient reports headache is much better.  He feels that symptoms are resolved.  She feels a little drowsy from the medication as expected.  Patient is clinically well in appearance.  She has a well-documented history of migraine headaches.  Today's headache sounds typical for migraine.  Patient does not have associated symptoms that are suspicious at this time for active coronavirus illness.  At this time, patient does not appear to have indications for testing through the emergency department.  Return precautions are reviewed.  Patient discharged in good condition.  Jarome Matinabitha M Witting was evaluated in Emergency Department on 09/04/2018 for the symptoms described in the history of present illness. She was evaluated in the context of the global COVID-19 pandemic, which necessitated consideration that the patient might be at risk for infection with the SARS-CoV-2 virus that causes COVID-19. Institutional protocols and algorithms that pertain to the evaluation of patients at risk for COVID-19 are in a state of rapid change based on information released by regulatory bodies including the CDC and federal and state organizations. These policies and algorithms were followed during the patient's care in the ED.  Final Clinical Impressions(s) / ED Diagnoses   Final diagnoses:  Migraine without status migrainosus, not intractable, unspecified migraine type    ED Discharge Orders    None       Arby BarrettePfeiffer, Ovila Lepage, MD 09/04/18 1342

## 2018-09-04 NOTE — Discharge Instructions (Addendum)
1.  Follow-up with your neurologist as needed. 2.  Return to the emergency department if you have new or worsening symptoms.

## 2018-09-04 NOTE — ED Notes (Signed)
Patient verbalizes understanding of discharge instructions . Opportunity for questions and answers were provided . Armband removed by staff ,Pt discharged from ED. W/C  offered at D/C  and Declined W/C at D/C and was escorted to lobby by RN.  

## 2018-09-06 DIAGNOSIS — G43109 Migraine with aura, not intractable, without status migrainosus: Secondary | ICD-10-CM | POA: Diagnosis not present

## 2018-09-08 ENCOUNTER — Telehealth: Payer: Self-pay

## 2018-09-08 NOTE — Telephone Encounter (Signed)
Moved pts OV to VIDEO 5/22 at 830am     Virtual Visit Pre-Appointment Phone Call  "(Name), I am calling you today to discuss your upcoming appointment. We are currently trying to limit exposure to the virus that causes COVID-19 by seeing patients at home rather than in the office."  1. "What is the BEST phone number to call the day of the visit?" - include this in appointment notes  2. Do you have or have access to (through a family member/friend) a smartphone with video capability that we can use for your visit?" a. If yes - list this number in appt notes as cell (if different from BEST phone #) and list the appointment type as a VIDEO visit in appointment notes b. If no - list the appointment type as a PHONE visit in appointment notes  3. Confirm consent - "In the setting of the current Covid19 crisis, you are scheduled for a (phone or video) visit with your provider on (date) at (time).  Just as we do with many in-office visits, in order for you to participate in this visit, we must obtain consent.  If you'd like, I can send this to your mychart (if signed up) or email for you to review.  Otherwise, I can obtain your verbal consent now.  All virtual visits are billed to your insurance company just like a normal visit would be.  By agreeing to a virtual visit, we'd like you to understand that the technology does not allow for your provider to perform an examination, and thus may limit your provider's ability to fully assess your condition. If your provider identifies any concerns that need to be evaluated in person, we will make arrangements to do so.  Finally, though the technology is pretty good, we cannot assure that it will always work on either your or our end, and in the setting of a video visit, we may have to convert it to a phone-only visit.  In either situation, we cannot ensure that we have a secure connection.  Are you willing to proceed?" STAFF: Did the patient verbally acknowledge  consent to telehealth visit? Document YES/NO here: YES  4. Advise patient to be prepared - "Two hours prior to your appointment, go ahead and check your blood pressure, pulse, oxygen saturation, and your weight (if you have the equipment to check those) and write them all down. When your visit starts, your provider will ask you for this information. If you have an Apple Watch or Kardia device, please plan to have heart rate information ready on the day of your appointment. Please have a pen and paper handy nearby the day of the visit as well."  5. Give patient instructions for MyChart download to smartphone OR [email or web address] as below if video visit (depending on what platform provider is using)  6. Inform patient they will receive a phone call 15 minutes prior to their appointment time (may be from unknown caller ID) so they should be prepared to answer    TELEPHONE CALL NOTE  Hanifa LATAZIA CHAMBER has been deemed a candidate for a follow-up tele-health visit to limit community exposure during the Covid-19 pandemic. I spoke with the patient via phone to ensure availability of phone/video source, confirm preferred email & phone number, and discuss instructions and expectations.  I reminded Tasha Ross to be prepared with any vital sign and/or heart rhythm information that could potentially be obtained via home monitoring, at the time of her visit. I reminded  [name] to expect a phone call prior to her visit.  Leanord Hawking, RN 09/08/2018 3:28 PM

## 2018-09-09 ENCOUNTER — Telehealth: Payer: Self-pay | Admitting: Internal Medicine

## 2018-09-09 DIAGNOSIS — G43011 Migraine without aura, intractable, with status migrainosus: Secondary | ICD-10-CM | POA: Diagnosis not present

## 2018-09-09 DIAGNOSIS — G44209 Tension-type headache, unspecified, not intractable: Secondary | ICD-10-CM | POA: Diagnosis not present

## 2018-09-09 DIAGNOSIS — M953 Acquired deformity of neck: Secondary | ICD-10-CM | POA: Diagnosis not present

## 2018-09-09 DIAGNOSIS — M955 Acquired deformity of pelvis: Secondary | ICD-10-CM | POA: Diagnosis not present

## 2018-09-14 DIAGNOSIS — M955 Acquired deformity of pelvis: Secondary | ICD-10-CM | POA: Diagnosis not present

## 2018-09-14 DIAGNOSIS — M953 Acquired deformity of neck: Secondary | ICD-10-CM | POA: Diagnosis not present

## 2018-09-14 DIAGNOSIS — G44209 Tension-type headache, unspecified, not intractable: Secondary | ICD-10-CM | POA: Diagnosis not present

## 2018-09-14 DIAGNOSIS — G43011 Migraine without aura, intractable, with status migrainosus: Secondary | ICD-10-CM | POA: Diagnosis not present

## 2018-09-16 DIAGNOSIS — G44209 Tension-type headache, unspecified, not intractable: Secondary | ICD-10-CM | POA: Diagnosis not present

## 2018-09-16 DIAGNOSIS — M955 Acquired deformity of pelvis: Secondary | ICD-10-CM | POA: Diagnosis not present

## 2018-09-16 DIAGNOSIS — M953 Acquired deformity of neck: Secondary | ICD-10-CM | POA: Diagnosis not present

## 2018-09-16 DIAGNOSIS — G43011 Migraine without aura, intractable, with status migrainosus: Secondary | ICD-10-CM | POA: Diagnosis not present

## 2018-09-21 DIAGNOSIS — M955 Acquired deformity of pelvis: Secondary | ICD-10-CM | POA: Diagnosis not present

## 2018-09-21 DIAGNOSIS — G43011 Migraine without aura, intractable, with status migrainosus: Secondary | ICD-10-CM | POA: Diagnosis not present

## 2018-09-21 DIAGNOSIS — M953 Acquired deformity of neck: Secondary | ICD-10-CM | POA: Diagnosis not present

## 2018-09-21 DIAGNOSIS — G44209 Tension-type headache, unspecified, not intractable: Secondary | ICD-10-CM | POA: Diagnosis not present

## 2018-09-23 ENCOUNTER — Telehealth: Payer: Self-pay

## 2018-09-23 NOTE — Telephone Encounter (Signed)
Called to get verbal consent to virtual visit tomorrow with Dr Tenny Craw and could not leave message because no voicemail came on. Phone just rings a couple times then hangs up.

## 2018-09-24 ENCOUNTER — Other Ambulatory Visit: Payer: Self-pay

## 2018-09-24 ENCOUNTER — Telehealth: Payer: Self-pay

## 2018-09-24 ENCOUNTER — Telehealth (INDEPENDENT_AMBULATORY_CARE_PROVIDER_SITE_OTHER): Payer: BLUE CROSS/BLUE SHIELD | Admitting: Internal Medicine

## 2018-09-24 ENCOUNTER — Encounter: Payer: Self-pay | Admitting: Internal Medicine

## 2018-09-24 VITALS — Ht 62.0 in | Wt 155.0 lb

## 2018-09-24 DIAGNOSIS — I428 Other cardiomyopathies: Secondary | ICD-10-CM

## 2018-09-24 DIAGNOSIS — R002 Palpitations: Secondary | ICD-10-CM

## 2018-09-24 NOTE — Patient Instructions (Signed)
Medication Instructions:  No changes If you need a refill on your cardiac medications before your next appointment, please call your pharmacy.   Lab work: none If you have labs (blood work) drawn today and your tests are completely normal, you will receive your results only by: . MyChart Message (if you have MyChart) OR . A paper copy in the mail If you have any lab test that is abnormal or we need to change your treatment, we will call you to review the results.  Testing/Procedures: none  Follow-Up: At [facility], you and your health needs are our priority.  As part of our continuing mission to provide you with exceptional heart care, we have created designated Provider Care Teams.  These Care Teams include your primary Cardiologist (physician) and Advanced Practice Providers (APPs -  Physician Assistants and Nurse Practitioners) who all work together to provide you with the care you need, when you need it. You will need a follow up appointment in:  6 months.  Please call our office 2 months in advance to schedule this appointment.  You may see Paula Ross, MD or one of the following Advanced Practice Providers on your designated Care Team: Scott Weaver, PA-C Vin Bhagat, PA-C . Janine Hammond, NP  Any Other Special Instructions Will Be Listed Below (If Applicable).     

## 2018-09-24 NOTE — Progress Notes (Signed)
Virtual Visit via Video Note   This visit type was conducted due to national recommendations for restrictions regarding the COVID-19 Pandemic (e.g. social distancing) in an effort to limit this patient's exposure and mitigate transmission in our community.  Due to her co-morbid illnesses, this patient is at least at moderate risk for complications without adequate follow up.  This format is felt to be most appropriate for this patient at this time.  All issues noted in this document were discussed and addressed.  A limited physical exam was performed with this format.  Please refer to the patient's chart for her consent to telehealth for [facility].   Date:  09/24/2018   ID:  Tasha Ross, DOB December 16, 1975, MRN [id number]  Patient Location: Home Provider Location: Home  PCP:  Dolan Amen, FNP  Cardiologist:  Dietrich Pates, MD  Electrophysiologist:  None   Evaluation Performed:  Follow-Up Visit  Chief Complaint:  F/U of CHF    History of Present Illness:    Tasha Ross is a 43 y.o. female with hx of noncompacton CM.  Echo in 2016 LVEF 45 to 50%   Cardiac MRI confirmed noncompaction LVEF 55%    GXT low risk 03/2015.   CPX 12/2015 mild to mod circulatory limitation due to CHF.    Echo Feb 2019 LVEF 55%    She was last in clinic in Jan 2020   Seen by Wende Mott   Echo in Feb 2020 LVEf wa normal   Montior showed no significant arrhtyhmias.  Since seen the pt has done wll  Breathing is OK  No dizziness  No CP  No edema     The patient {does not  have symptoms concerning for COVID-19 infection (fever, chills, cough, or new shortness of breath).    Past Medical History:  Diagnosis Date  . Cardiomyopathy (HCC)    a. Echo 01/2015: EF 45-50% with diffuse HK. // Echo 06/2018: EF 55, no RWMA, prominent apical trabeculation  . History of stress test    a. ETT 11/16:  normal  . Migraine   . Near syncope   . Snoring 04/06/2015  . SVT (supraventricular tachycardia) (HCC)    a. Seen on  event monitor 01/2015.  Marland Kitchen Vertigo    Past Surgical History:  Procedure Laterality Date  . CYSTECTOMY     from ovaries  . TUBAL LIGATION Bilateral   . VAGINAL HYSTERECTOMY       No outpatient medications have been marked as taking for the 09/24/18 encounter (Appointment) with Pricilla Riffle, MD.     Allergies:   Ibuprofen; Amitriptyline; Clindamycin; Sulfa antibiotics; Sulfur; and Sulfamethoxazole   Social History   Tobacco Use  . Smoking status: Never Smoker  . Smokeless tobacco: Never Used  Substance Use Topics  . Alcohol use: Yes    Alcohol/week: 0.0 standard drinks    Comment: Rare - once or twice a month.  . Drug use: No     Family Hx: The patient's family history includes Cancer in her father; Diabetes in an other family member; Heart disease in her maternal aunt; Hypertension in an other family member; Lupus in an other family member. There is no history of Stroke, Fainting, Migraines, or Seizures.  ROS:   Please see the history of present illness.    All other systems reviewed and are negative.   Prior CV studies:   The following studies were reviewed today: Echo 06/17/18   1. The left ventricle has  a visually estimated ejection fraction of of 55%. The cavity size was normal. Left ventricular diastolic parameters were normal No evidence of left ventricular regional wall motion abnormalities. Diagnosis of noncompaction made  in the past by cardiac MRI. Somewhat prominent trabeculation towards the apex but cannot make definitive diagnosis from this echo.  2. The right ventricle has normal systolic function. The cavity was normal. There is no increase in right ventricular wall thickness.  3. The mitral valve is normal in structure. No evidence of mitral valve stenosis. No regurgitation.  4. The tricuspid valve is normal in structure.  5. The aortic valve is tricuspid. No stenosis.  6. The pulmonic valve was normal in structure.  7. The aortic root and ascending aorta  are normal in size and structure.  8. No evidence of left ventricular regional wall motion abnormalities.  9. Right atrial pressure is estimated at 3 mmHg. 10. No complete TR doppler jet so unable to estimate PA systolic pressure.  FINDINGS  Left Ventricle: The left ventricle has a visually estimated ejection fraction of of 55%. The cavity size was normal. There is no increase in left ventricular wall thickness. Left ventricular diastolic parameters were normal No evidence of left  ventricular regional wall motion abnormalities.. Right Ventricle: The right ventricle has normal systolic function. The cavity was normal. There is no increase in right ventricular wall thickness. Left Atrium: left atrial size was normal in size Right Atrium: right atrial size was normal in size Right atrial pressure is estimated at 3 mmHg. Interatrial Septum: No atrial level shunt detected by color flow Doppler. Pericardium: There is no evidence of pericardial effusion. Mitral Valve: The mitral valve is normal in structure. Mitral valve regurgitation is not visualized by color flow Doppler. No evidence of mitral valve stenosis. Tricuspid Valve: The tricuspid valve is normal in structure. Tricuspid valve regurgitation was not visualized by color flow Doppler. Aortic Valve: The aortic valve is tricuspid Aortic valve regurgitation was not visualized by color flow Doppler. Pulmonic Valve: The pulmonic valve was normal in structure. Pulmonic valve regurgitation is not visualized by color flow Doppler. Aorta: The aortic root and ascending aorta are normal in size and structure. Venous: The inferior vena cava is normal in size with greater than 50% respiratory variability.   LEFT VENTRICLE PLAX 2D (Teich) LV EF:          73.4 %   Diastology LVIDd:          4.50 cm  LV e' lateral:   18.10 cm/s LVIDs:          2.60 cm  LV E/e' lateral: 5.0 LV PW:          0.90 cm  LV e' medial:    12.40 cm/s LV IVS:         1.00 cm  LV  E/e' medial:  7.3 LVOT diam:      2.05 cm LV SV:          68 ml LVOT Area:      3.30 cm  RIGHT VENTRICLE RV Basal diam:  1.10 cm RV S prime:     9.67 cm/s TAPSE (M-mode): 1.3 cm  LEFT ATRIUM             Index       RIGHT ATRIUM          Index LA diam:        3.40 cm 1.99 cm/m  RA Pressure: 3 mmHg LA Vol (A2C):  47.8 ml 28.02 ml/m RA Area:     8.71 cm LA Vol (A4C):   42.5 ml 24.91 ml/m RA Volume:   15.90 ml 9.32 ml/m LA Biplane Vol: 48.9 ml 28.66 ml/m  AORTIC VALVE LVOT Vmax:   108.00 cm/s LVOT Vmean:  78.600 cm/s LVOT VTI:    0.238 m   AORTA Ao Root diam: 2.70 cm  MITRAL VALVE MV Area (PHT): cm MV PHT:        msec MV Decel Time: 169 msec MV E velocity: 90.00 cm/s MV A velocity: 60.00 cm/s MV E/A ratio:  1.50    Marca Anconaalton Mclean MD Electronically signed by Marca Anconaalton Mclean MD Signature Date/Time: 06/17/2018/2:18:06 PM     Labs/Other Tests and Data Reviewed:    EKG:  No ECG reviewed.  Recent Labs: 06/02/2018: BUN 14; Creatinine, Ser 0.65; Potassium 4.6; Sodium 139; TSH 1.480   Recent Lipid Panel Lab Results  Component Value Date/Time   CHOL 147 09/28/2015 12:17 PM   TRIG 48 09/28/2015 12:17 PM   HDL 61 09/28/2015 12:17 PM   CHOLHDL 2.4 09/28/2015 12:17 PM   LDLCALC 76 09/28/2015 12:17 PM    Wt Readings from Last 3 Encounters:  09/04/18 155 lb (70.3 kg)  06/09/18 153 lb (69.4 kg)  06/02/18 154 lb (69.9 kg)     Objective:    Vital Signs:  There were no vitals taken for this visit.   No vitals to review    ASSESSMENT & PLAN:    1. LV noncompaction  Recent echo in 2/20 LVEF was normal    Doing well clinicially   Follow    2  Palptations  Pt denies   Monitor showed no arrhythmias    3   Hx migraines   Followed in neuro  4  COVID-19 Education: The signs and symptoms of COVID-19 were discussed with the patient and how to seek care for testing (follow up with PCP or arrange E-visit).  The importance of social distancing was discussed today.   Time:   Today, I have spent 15   minutes with the patient with telehealth technology discussing the above problems.     Medication Adjustments/Labs and Tests Ordered: Current medicines are reviewed at length with the patient today.  Concerns regarding medicines are outlined above.   Tests Ordered: No orders of the defined types were placed in this encounter.   Medication Changes: No orders of the defined types were placed in this encounter.   Disposition:  Follow up 6 months    Signed, Dietrich PatesPaula Saksham Akkerman, MD  09/24/2018 12:05 AM    [facility]

## 2018-09-24 NOTE — Telephone Encounter (Signed)

## 2018-09-30 DIAGNOSIS — G44209 Tension-type headache, unspecified, not intractable: Secondary | ICD-10-CM | POA: Diagnosis not present

## 2018-09-30 DIAGNOSIS — M953 Acquired deformity of neck: Secondary | ICD-10-CM | POA: Diagnosis not present

## 2018-09-30 DIAGNOSIS — M955 Acquired deformity of pelvis: Secondary | ICD-10-CM | POA: Diagnosis not present

## 2018-09-30 DIAGNOSIS — G43011 Migraine without aura, intractable, with status migrainosus: Secondary | ICD-10-CM | POA: Diagnosis not present

## 2018-10-02 DIAGNOSIS — G43011 Migraine without aura, intractable, with status migrainosus: Secondary | ICD-10-CM | POA: Diagnosis not present

## 2018-10-02 DIAGNOSIS — M955 Acquired deformity of pelvis: Secondary | ICD-10-CM | POA: Diagnosis not present

## 2018-10-02 DIAGNOSIS — M953 Acquired deformity of neck: Secondary | ICD-10-CM | POA: Diagnosis not present

## 2018-10-02 DIAGNOSIS — G44209 Tension-type headache, unspecified, not intractable: Secondary | ICD-10-CM | POA: Diagnosis not present

## 2018-10-05 DIAGNOSIS — G44209 Tension-type headache, unspecified, not intractable: Secondary | ICD-10-CM | POA: Diagnosis not present

## 2018-10-05 DIAGNOSIS — G43011 Migraine without aura, intractable, with status migrainosus: Secondary | ICD-10-CM | POA: Diagnosis not present

## 2018-10-05 DIAGNOSIS — M953 Acquired deformity of neck: Secondary | ICD-10-CM | POA: Diagnosis not present

## 2018-10-05 DIAGNOSIS — M955 Acquired deformity of pelvis: Secondary | ICD-10-CM | POA: Diagnosis not present

## 2018-10-07 DIAGNOSIS — M953 Acquired deformity of neck: Secondary | ICD-10-CM | POA: Diagnosis not present

## 2018-10-07 DIAGNOSIS — G44209 Tension-type headache, unspecified, not intractable: Secondary | ICD-10-CM | POA: Diagnosis not present

## 2018-10-07 DIAGNOSIS — M955 Acquired deformity of pelvis: Secondary | ICD-10-CM | POA: Diagnosis not present

## 2018-10-07 DIAGNOSIS — G43011 Migraine without aura, intractable, with status migrainosus: Secondary | ICD-10-CM | POA: Diagnosis not present

## 2018-10-11 DIAGNOSIS — G43011 Migraine without aura, intractable, with status migrainosus: Secondary | ICD-10-CM | POA: Diagnosis not present

## 2018-10-11 DIAGNOSIS — M953 Acquired deformity of neck: Secondary | ICD-10-CM | POA: Diagnosis not present

## 2018-10-11 DIAGNOSIS — G44209 Tension-type headache, unspecified, not intractable: Secondary | ICD-10-CM | POA: Diagnosis not present

## 2018-10-11 DIAGNOSIS — M955 Acquired deformity of pelvis: Secondary | ICD-10-CM | POA: Diagnosis not present

## 2018-10-19 DIAGNOSIS — G44209 Tension-type headache, unspecified, not intractable: Secondary | ICD-10-CM | POA: Diagnosis not present

## 2018-10-19 DIAGNOSIS — M953 Acquired deformity of neck: Secondary | ICD-10-CM | POA: Diagnosis not present

## 2018-10-19 DIAGNOSIS — G43011 Migraine without aura, intractable, with status migrainosus: Secondary | ICD-10-CM | POA: Diagnosis not present

## 2018-10-19 DIAGNOSIS — M955 Acquired deformity of pelvis: Secondary | ICD-10-CM | POA: Diagnosis not present

## 2018-10-21 DIAGNOSIS — M955 Acquired deformity of pelvis: Secondary | ICD-10-CM | POA: Diagnosis not present

## 2018-10-21 DIAGNOSIS — G44209 Tension-type headache, unspecified, not intractable: Secondary | ICD-10-CM | POA: Diagnosis not present

## 2018-10-21 DIAGNOSIS — M953 Acquired deformity of neck: Secondary | ICD-10-CM | POA: Diagnosis not present

## 2018-10-21 DIAGNOSIS — G43011 Migraine without aura, intractable, with status migrainosus: Secondary | ICD-10-CM | POA: Diagnosis not present

## 2018-10-25 DIAGNOSIS — G43011 Migraine without aura, intractable, with status migrainosus: Secondary | ICD-10-CM | POA: Diagnosis not present

## 2018-10-25 DIAGNOSIS — M955 Acquired deformity of pelvis: Secondary | ICD-10-CM | POA: Diagnosis not present

## 2018-10-25 DIAGNOSIS — G44209 Tension-type headache, unspecified, not intractable: Secondary | ICD-10-CM | POA: Diagnosis not present

## 2018-10-25 DIAGNOSIS — M953 Acquired deformity of neck: Secondary | ICD-10-CM | POA: Diagnosis not present

## 2018-11-04 DIAGNOSIS — G44209 Tension-type headache, unspecified, not intractable: Secondary | ICD-10-CM | POA: Diagnosis not present

## 2018-11-04 DIAGNOSIS — G43011 Migraine without aura, intractable, with status migrainosus: Secondary | ICD-10-CM | POA: Diagnosis not present

## 2018-11-04 DIAGNOSIS — M953 Acquired deformity of neck: Secondary | ICD-10-CM | POA: Diagnosis not present

## 2018-11-04 DIAGNOSIS — M955 Acquired deformity of pelvis: Secondary | ICD-10-CM | POA: Diagnosis not present

## 2018-11-25 IMAGING — DX DG CHEST 2V
2 series · 2 of 2 positions shown · non-contrast
Comparison: 07/04/2016

CLINICAL DATA: Became dizzy, nauseated and near syncopal at work
yesterday, substernal chest pain this morning, history of
cardiomyopathy

EXAM:
CHEST  2 VIEW

[chest pa]
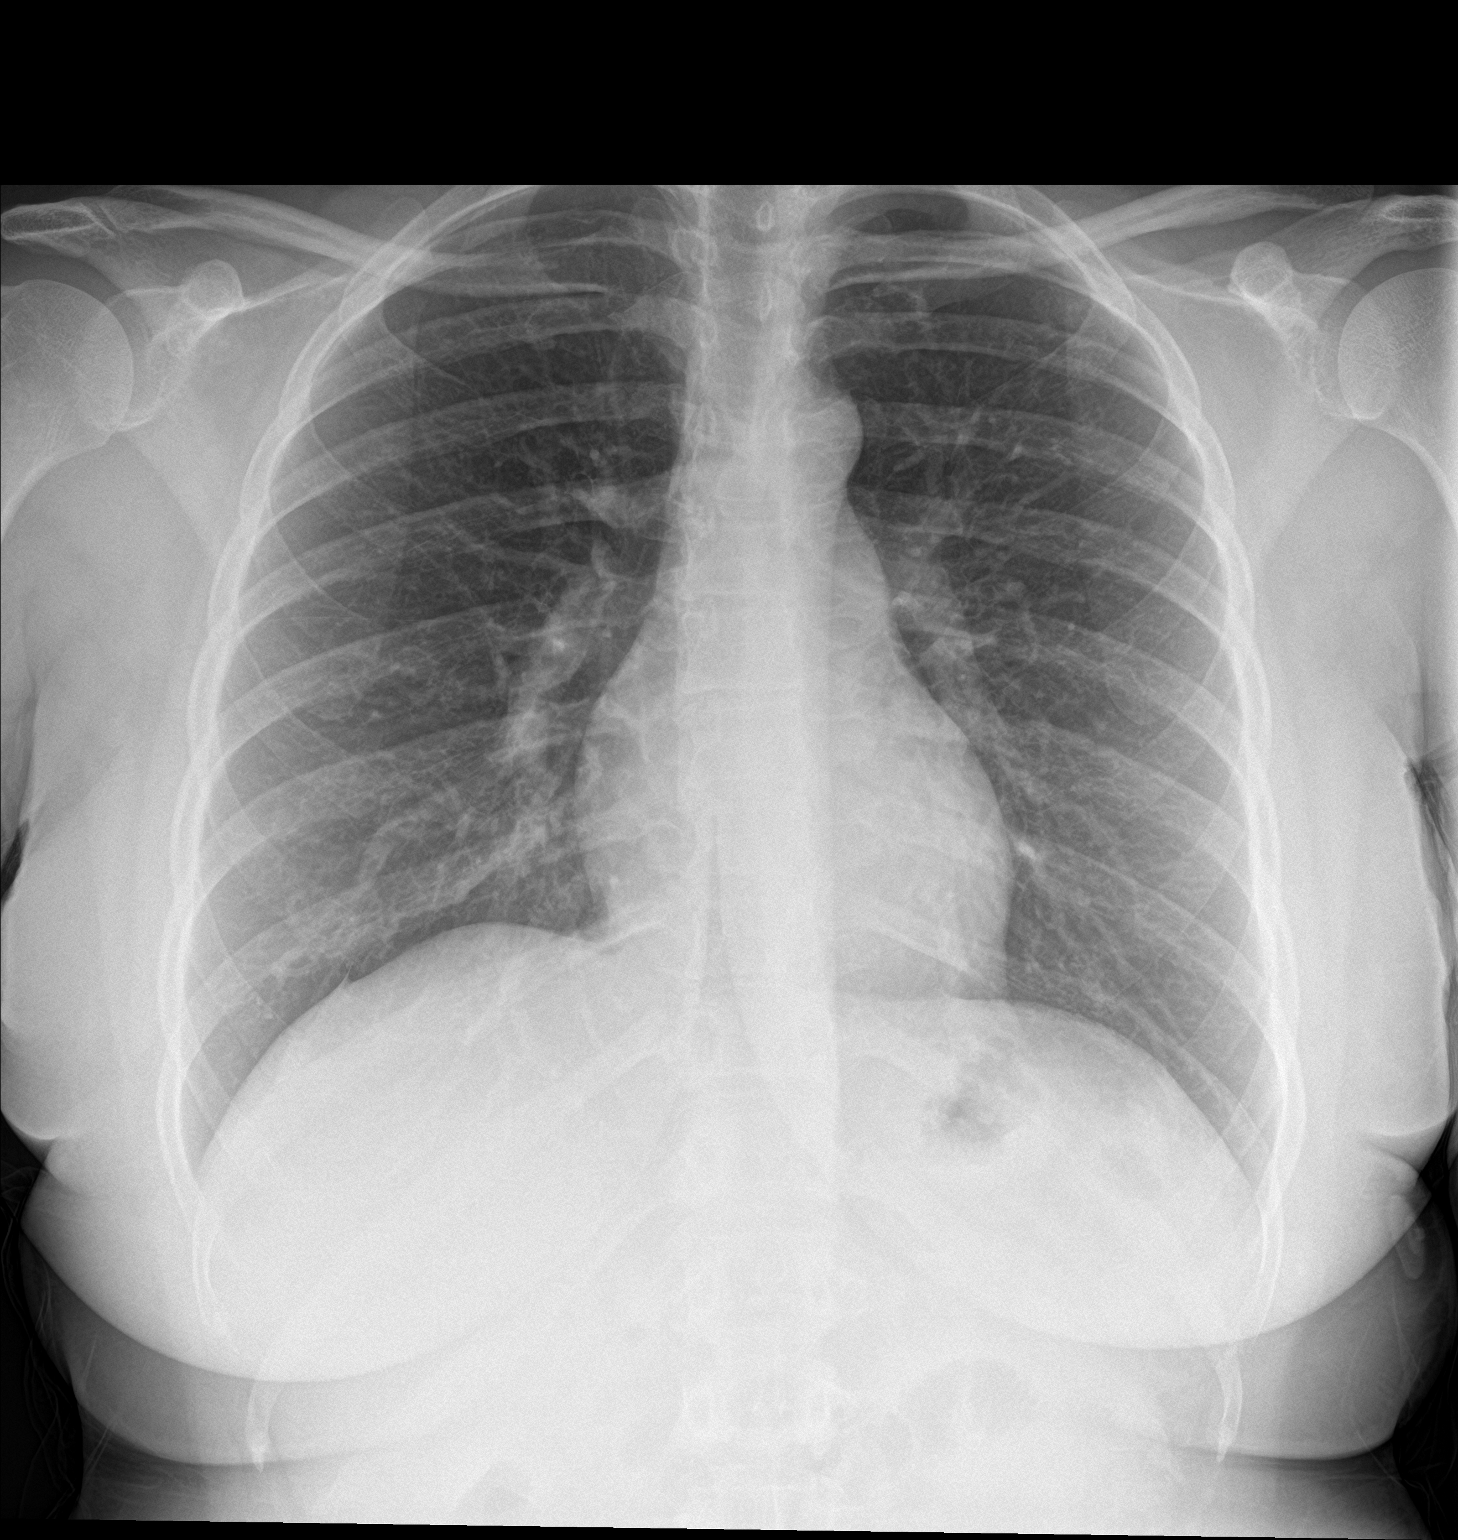

[chest lat]
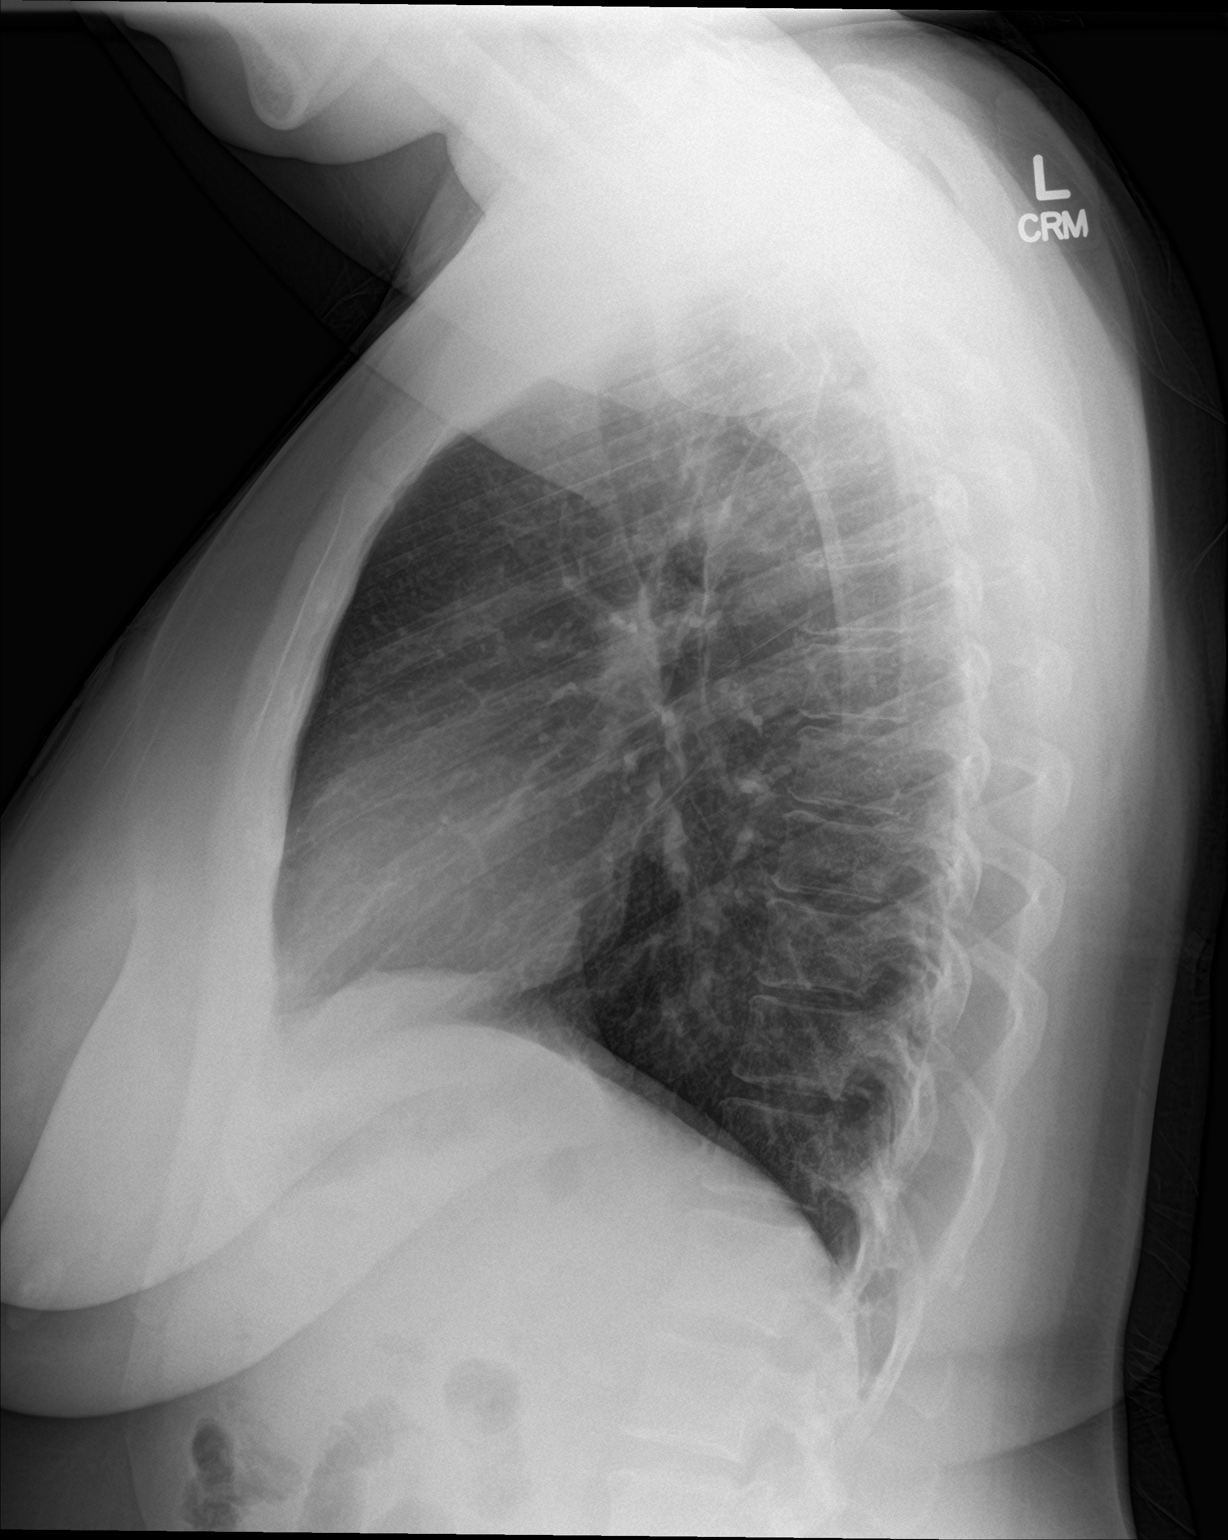

[2 of 2 positions shown; findings below may reference images not displayed]

FINDINGS: Normal heart size, mediastinal contours, and pulmonary vascularity.

Lungs clear.

No pleural effusion or pneumothorax.

Bones unremarkable.
IMPRESSION: Normal exam.

## 2018-11-29 ENCOUNTER — Encounter: Payer: Self-pay | Admitting: Neurology

## 2018-11-30 DIAGNOSIS — M955 Acquired deformity of pelvis: Secondary | ICD-10-CM | POA: Diagnosis not present

## 2018-11-30 DIAGNOSIS — M953 Acquired deformity of neck: Secondary | ICD-10-CM | POA: Diagnosis not present

## 2018-11-30 DIAGNOSIS — G44209 Tension-type headache, unspecified, not intractable: Secondary | ICD-10-CM | POA: Diagnosis not present

## 2018-11-30 DIAGNOSIS — G43011 Migraine without aura, intractable, with status migrainosus: Secondary | ICD-10-CM | POA: Diagnosis not present

## 2018-12-02 DIAGNOSIS — M953 Acquired deformity of neck: Secondary | ICD-10-CM | POA: Diagnosis not present

## 2018-12-02 DIAGNOSIS — G44209 Tension-type headache, unspecified, not intractable: Secondary | ICD-10-CM | POA: Diagnosis not present

## 2018-12-02 DIAGNOSIS — S335XXA Sprain of ligaments of lumbar spine, initial encounter: Secondary | ICD-10-CM | POA: Diagnosis not present

## 2018-12-02 DIAGNOSIS — M955 Acquired deformity of pelvis: Secondary | ICD-10-CM | POA: Diagnosis not present

## 2018-12-07 DIAGNOSIS — M955 Acquired deformity of pelvis: Secondary | ICD-10-CM | POA: Diagnosis not present

## 2018-12-07 DIAGNOSIS — S335XXA Sprain of ligaments of lumbar spine, initial encounter: Secondary | ICD-10-CM | POA: Diagnosis not present

## 2018-12-07 DIAGNOSIS — M953 Acquired deformity of neck: Secondary | ICD-10-CM | POA: Diagnosis not present

## 2018-12-07 DIAGNOSIS — G44209 Tension-type headache, unspecified, not intractable: Secondary | ICD-10-CM | POA: Diagnosis not present

## 2018-12-09 DIAGNOSIS — G44209 Tension-type headache, unspecified, not intractable: Secondary | ICD-10-CM | POA: Diagnosis not present

## 2018-12-09 DIAGNOSIS — M955 Acquired deformity of pelvis: Secondary | ICD-10-CM | POA: Diagnosis not present

## 2018-12-09 DIAGNOSIS — S335XXA Sprain of ligaments of lumbar spine, initial encounter: Secondary | ICD-10-CM | POA: Diagnosis not present

## 2018-12-09 DIAGNOSIS — M953 Acquired deformity of neck: Secondary | ICD-10-CM | POA: Diagnosis not present

## 2018-12-18 DIAGNOSIS — G44209 Tension-type headache, unspecified, not intractable: Secondary | ICD-10-CM | POA: Diagnosis not present

## 2018-12-18 DIAGNOSIS — M955 Acquired deformity of pelvis: Secondary | ICD-10-CM | POA: Diagnosis not present

## 2018-12-18 DIAGNOSIS — M953 Acquired deformity of neck: Secondary | ICD-10-CM | POA: Diagnosis not present

## 2018-12-18 DIAGNOSIS — S335XXA Sprain of ligaments of lumbar spine, initial encounter: Secondary | ICD-10-CM | POA: Diagnosis not present

## 2018-12-20 DIAGNOSIS — G44209 Tension-type headache, unspecified, not intractable: Secondary | ICD-10-CM | POA: Diagnosis not present

## 2018-12-20 DIAGNOSIS — M955 Acquired deformity of pelvis: Secondary | ICD-10-CM | POA: Diagnosis not present

## 2018-12-20 DIAGNOSIS — M953 Acquired deformity of neck: Secondary | ICD-10-CM | POA: Diagnosis not present

## 2018-12-20 DIAGNOSIS — S335XXA Sprain of ligaments of lumbar spine, initial encounter: Secondary | ICD-10-CM | POA: Diagnosis not present

## 2018-12-23 ENCOUNTER — Other Ambulatory Visit: Payer: Self-pay

## 2018-12-23 ENCOUNTER — Telehealth (INDEPENDENT_AMBULATORY_CARE_PROVIDER_SITE_OTHER): Payer: BC Managed Care – PPO | Admitting: Neurology

## 2018-12-23 ENCOUNTER — Encounter: Payer: Self-pay | Admitting: Neurology

## 2018-12-23 VITALS — Ht 62.0 in | Wt 154.0 lb

## 2018-12-23 DIAGNOSIS — R55 Syncope and collapse: Secondary | ICD-10-CM

## 2018-12-23 DIAGNOSIS — G43109 Migraine with aura, not intractable, without status migrainosus: Secondary | ICD-10-CM

## 2018-12-23 MED ORDER — TRAMADOL HCL 50 MG PO TABS: ORAL_TABLET | ORAL | 5 refills | Status: DC

## 2018-12-23 MED ORDER — VENLAFAXINE HCL ER 37.5 MG PO CP24: ORAL_CAPSULE | ORAL | 11 refills | Status: DC

## 2018-12-23 NOTE — Progress Notes (Signed)
Virtual Visit via Video Note The purpose of this virtual visit is to provide medical care while limiting exposure to the novel coronavirus.    Consent was obtained for video visit:  Yes.   Answered questions that patient had about telehealth interaction:  Yes.   I discussed the limitations, risks, security and privacy concerns of performing an evaluation and management service by telemedicine. I also discussed with the patient that there may be a patient responsible charge related to this service. The patient expressed understanding and agreed to proceed.  Pt location: Home Physician Location: office Name of referring provider:  Dolan AmenBailey, Sarah M, FNP I connected with Jarome Matinabitha M Cadden at patients initiation/request on 12/23/2018 at  8:30 AM EDT by video enabled telemedicine application and verified that I am speaking with the correct person using two identifiers. Pt MRN:  [id number] Pt DOB:  10/02/1975 Video Participants:  Jarome Matinabitha M Balla   History of Present Illness:  The patient was seen as a virtual video visit on 12/23/2018. She was last seen 6 months ago for vertigo, headaches, and syncope. MRI brain unremarkable. She had an abnormal echo and was referred to Cardiology, who did a holter monitor with note of tachycardia with heart rates up to 180-190 bpm. It was felt that amitriptyline was the potential cause. 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 She continues to do well with no syncopal episodes since April 2018. On her last visit, she reported doing well for a period of time and stopping Effexor, however headaches had recurred. She was instructed to restart Effexor 37.5mg  qhs which again helped, she was headache-free for several months and again stopped medication. For the past 2-3 days she feels they are starting to come back because she has been having floaters. She would like to restart  medication. She rarely takes the prn Tramadol. She has also been seeing a chiropractor which helps. She denies any dizziness, focal numbness/tingling/weakness, no falls. She has been trying to exercise regularly, walking 1-3 miles 2-3 times a week.   History on Initial Assessment 01/17/2015: This is a 43 yo RH woman presenting with vertigo, headaches, and episodes of passing out that started in 2009 or 2010. She had seen neurologist Dr. Shane CrutchStory in 2012 while living in [place], reporting that she is having the exact same symptoms she had, they had gotten under control while she was seeing him, with no episodes for a year, until January 2016 as she was transitioning [place]. She reports migraines followed a couple of days later by vertigo. She would brace herself, trying to sit still, but would lose consciousness. She has fallen to the floor at work. Sometimes she can brace herself, other times there is no warning. Symptoms are brief, she would wake up on the floor. [facility] was [facility]14 for near syncope, she reported right-sided weakness with slurred speech, that improved during transport. Glucose noted to be 72. She had an MRI brain without contrast with no acute intracranial changes. MRA brain reported a 1.487mm bulbous appearance of the distal left PICA branches in the region of the inferior cerebellar vermis. It may represent a vascular loop, however a small aneurysm is not entirely excluded. 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 Records from her previous neurologist were reviewed. He was concerned that dizziness and vertigo were not physiological  in nature. He had done an MRI/MRA brain reported as normal as well. TSH, RPR, CBC normal. B12 low normal at 278, she had received replacement treatment. For migraine prophylaxis, she was started on amitriptyline in 2012, then switched to Topamax in 2013. She  reports migraines are retro-orbital, sometimes dull or sharp pain, with phonophobia, nausea. She recalls taking amitriptyline and Topamax, and states these were stopped because the episodes stopped. She recalls taking Imitrex, Fioricet, Maxalt which would help initially. She feels the amitriptyline and citalopram in the past helped put her in a calm mode. She has been having more frequent migraines occurring twice a week, lasting 3-4 hours. Ibuprofen does not help. The vertigo occurs a couple of days after a migraine, with spinning sensation lasting up to a week. She usually gets less than 6 hours of sleep, feeling "real agitated." She reports the last syncopal episode was in June. When asked about stress, she does indicate that she has been working for [employer] since 2014, but had to move to a different location with her move in January.   She had a syncopal episode at work last 08/19/16 and has had a headache, neck pain, and seeing floaters since then. 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 No convulsive activity seen on video. She came to and recalls feeling sick to her stomach and unable to talk, with her words not coming out clear. She reports another syncopal episode in March on the way to work, she felt a pressure in her chest. She was recently treated with antibiotic for congestion, but has started feeling ill again. She wonders if weather changes are a trigger, she has become more hypersensitive to smells since Spring started.   Prior preventative medications: amitriptyline, topamax, Toprol, zonisamide, citalopram Prior rescue medications: imitrex, relpax, maxalt, zomig, fioricet  Outpatient Encounter Medications as of 12/23/2018  Medication Sig   azelastine (ASTELIN) 0.1 % nasal spray Place 2 sprays into both nostrils daily.   fluticasone (FLONASE) 50 MCG/ACT nasal spray Place 2 sprays into both  nostrils daily.   metoprolol succinate (TOPROL XL) 25 MG 24 hr tablet Take 1 tablet (25 mg total) by mouth daily. (Patient taking differently: Take 12.5 mg by mouth daily. )   traMADol (ULTRAM) 50 MG tablet Take 1 tablet as needed for severe migraine. Do not take more than 3 a week   venlafaxine XR (EFFEXOR-XR) 37.5 MG 24 hr capsule Take 1 capsule at night           No facility-administered encounter medications on file as of 12/23/2018.     Observations/Objective:   Vitals:   12/23/18 0831  Weight: 154 lb (69.9 kg)  Height: 5\' 2"  (1.575 m)   GEN:  The patient appears stated age and is in NAD.  Neurological examination: Patient is awake, alert, oriented x 3. No aphasia or dysarthria. Intact fluency and comprehension. Remote and recent memory intact. Able to name and repeat. Cranial nerves: Extraocular movements intact with no nystagmus. No facial asymmetry. Motor: moves all extremities symmetrically, at least anti-gravity x 4. No incoordination on finger to nose testing. Gait: narrow-based and steady, able to tandem walk adequately. Negative Romberg test.  Assessment and Plan:   This is a pleasant 43 yo RH woman with a history of headaches, dizziness, and syncope since 2009 or 2010, symptoms had quieted down, then recurred at the beginning of 2016. On  review of records, her previous neurologist was concerned dizziness and vertigo were not physiologic in nature. She did not tolerate amitriptyline for headache prophylaxis, in addition, Cardiology felt this was causing SVT on holter monitor. She is on Toprol for SVT. No further syncopal episodes since April 2018. As before, she has had long periods of being headache-free and would stop Effexor, she feels they are coming back and would like to restart Effexor 37.5mg  qhs. She has prn Tramadol for severe headaches. Follow-up in 8 months, she knows to call for any changes.    Follow Up Instructions:   -I discussed the assessment and treatment  plan with the patient. The patient was provided an opportunity to ask questions and all were answered. The patient agreed with the plan and demonstrated an understanding of the instructions.   The patient was advised to call back or seek an in-person evaluation if the symptoms worsen or if the condition fails to improve as anticipated.    Cameron Sprang, MD

## 2018-12-28 DIAGNOSIS — M953 Acquired deformity of neck: Secondary | ICD-10-CM | POA: Diagnosis not present

## 2018-12-28 DIAGNOSIS — G44209 Tension-type headache, unspecified, not intractable: Secondary | ICD-10-CM | POA: Diagnosis not present

## 2018-12-28 DIAGNOSIS — M955 Acquired deformity of pelvis: Secondary | ICD-10-CM | POA: Diagnosis not present

## 2018-12-28 DIAGNOSIS — S335XXA Sprain of ligaments of lumbar spine, initial encounter: Secondary | ICD-10-CM | POA: Diagnosis not present

## 2019-01-24 DIAGNOSIS — Z23 Encounter for immunization: Secondary | ICD-10-CM | POA: Diagnosis not present

## 2019-01-24 DIAGNOSIS — Z0000 Encounter for general adult medical examination without abnormal findings: Secondary | ICD-10-CM | POA: Diagnosis not present

## 2019-01-24 DIAGNOSIS — Z01419 Encounter for gynecological examination (general) (routine) without abnormal findings: Secondary | ICD-10-CM | POA: Diagnosis not present

## 2019-01-24 DIAGNOSIS — N898 Other specified noninflammatory disorders of vagina: Secondary | ICD-10-CM | POA: Diagnosis not present

## 2019-05-04 DIAGNOSIS — Z20828 Contact with and (suspected) exposure to other viral communicable diseases: Secondary | ICD-10-CM | POA: Diagnosis not present

## 2019-06-09 NOTE — Progress Notes (Signed)
Cardiology Office Note:    Date:  06/10/2019   ID:  Tasha Ross, DOB 07/04/1975, MRN [id number]  PCP:  No primary care provider on file.  Cardiologist:  Dietrich Pates, MD  Referring MD: Dolan Amen, FNP   Chief Complaint  Patient presents with  . Follow-up    cardiomyopathy    History of Present Illness:    Tasha Ross is a 44 y.o. female with a past medical history significant for noncompaction cardiomyopathy.  Echo in 2016 showed LVEF 45-50%.  Cardiac MRI confirmed noncompaction cardiomyopathy with EF 55%.  Stress test in 03/2015 was low risk.  CPX in 12/2015 showed mild to moderate circulatory limitation due to CHF.  Echocardiogram in 06/2017 with EF 55%. Home cardiac monitor in 2017 showed no significant arrhythmias.  Again home cardiac monitoring on 06/2018 showed no significant arrhythmias.  She has not been on angiotensin receptor blocker historically due to low blood pressure.  She was last seen by telemedicine by Dr. Tenny Craw 09/24/2018 and was stable. She is here today for routine follow up. 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 She has 4 sons age 85-15. She walks for about an hour, at least 3 days per week. She denies chest pain/pressure, shortness of breath, orthopnea, PND, edema, palpitations, lightheadedness or syncope.   She uses to pass out, but has not passed out in about 3 years. She has done much better being off of multiple meds including ARB and migraine medications. She tries to liberalize salt to support her BP but she does not like a lot of salt. She eats a heart healthy diet with very little processed or restaurant foods.    Cardiac studies   Long-term cardiac monitor 2-07/2018 SInus rhythm   Very short burst of PAT   No significant arrhythmia  Echo 06/17/2018 EF 55%, no regional wall motion abnormality, noncompaction cardiomyopathy by MRI in the past-somewhat prominent trabeculation towards the apex but cannot make definitive diagnosis from  his echo, normal RV SF.  Echo 06/19/17 EF 55, no RWMA, normal diastolic function, normal RVSF  CPX 12/28/15 Conclusion: Exercise testing with gas exchange demonstrates a mild functional impairment when compared to matched sedentary norms. Patient appears primarily circulatory limited with elevated VE/VCO2 slope and flat O2 pulse (given HR response was adequate). Patient's body habitus appears to be contributing to her exercise intolerance as well.  There is a mild to moderate circulatory limitation present due to HF.  Event Monitor 11/26/15 Sinus rhythm, sinus tachycardia Occasional PVC Symptoms did not always correlate with signif elevated HR  GXT 03/23/15 No ST segment changes with stress  Cardiac MRI 03/09/15 IMPRESSION: 1. Mild left ventricular dilatation with normal wall thickness and normal systolic function (LVEF = 55%) with no regional wall motion abnormalities.  There are pronounced trabeculations in the apical and mid segments with compacted to non-compacted ratio > 2.3:1 when measured in long axis views.  2. Normal right ventricular size, thickness and systolic function (LVEF = 53%) with no regional wall motion abnormalities.  3. Mild mitral and trivial tricuspid regurgitation.  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 A standard CHF therapy is recommended.  Event monitor 01/25/15  Normal Sinus Rhythm and snius tachcyardia with heart rate ranging from 50 to 121  SVT up to 193 bpm  Rare PVC  Echo 01/19/15 EF 45-50%, diffuse HK   Past Medical History:  Diagnosis Date  . Cardiomyopathy (HCC)  a. Echo 01/2015: EF 45-50% with diffuse HK. // Echo 06/2018: EF 55, no RWMA, prominent apical trabeculation  . History of stress test    a. ETT 11/16:  normal  . Migraine   . Near syncope   . Snoring 04/06/2015  . SVT (supraventricular tachycardia) ([place])    a. Seen on event  monitor 01/2015.  Marland Kitchen Vertigo     Past Surgical History:  Procedure Laterality Date  . CYSTECTOMY     from ovaries  . TUBAL LIGATION Bilateral   . VAGINAL HYSTERECTOMY      Current Medications: Current Meds  Medication Sig  . azelastine (ASTELIN) 0.1 % nasal spray Place 2 sprays into both nostrils daily.  . fluticasone (FLONASE) 50 MCG/ACT nasal spray Place 2 sprays into both nostrils daily.  . metoprolol succinate (TOPROL XL) 25 MG 24 hr tablet Take 1 tablet (25 mg total) by mouth daily. (Patient taking differently: Take 12.5 mg by mouth daily. )  . traMADol (ULTRAM) 50 MG tablet Take 1 tablet as needed for severe migraine. Do not take more than 3 a week  . venlafaxine XR (EFFEXOR-XR) 37.5 MG 24 hr capsule Take 1 capsule at night     Allergies:   Ibuprofen, Amitriptyline, Clindamycin, Sulfa antibiotics, Sulfur, and Sulfamethoxazole   Social History   Socioeconomic History  . Marital status: Single    Spouse name: Not on file  . Number of children: 4  . Years of education: Not on file  . Highest education level: Not on file  Occupational History  . Not on file  Tobacco Use  . Smoking status: Never Smoker  . Smokeless tobacco: Never Used  Substance and Sexual Activity  . Alcohol use: Yes    Alcohol/week: 0.0 standard drinks    Comment: Rare - once or twice a month.  . Drug use: No  . Sexual activity: Yes  Other Topics Concern  . Not on file  Social History Narrative   Lives with 3 of her sons.  Has 4 sons.  Works for [employer].  Education: some college.   Social Determinants of Health   Financial Resource Strain:   . Difficulty of Paying Living Expenses: Not on file  Food Insecurity:   . Worried About Charity fundraiser in the Last Year: Not on file  . Ran Out of Food in the Last Year: Not on file  Transportation Needs:   . Lack of Transportation (Medical): Not on file  . Lack of Transportation (Non-Medical): Not on file  Physical Activity:   . Days of Exercise  per Week: Not on file  . Minutes of Exercise per Session: Not on file  Stress:   . Feeling of Stress : Not on file  Social Connections:   . Frequency of Communication with Friends and Family: Not on file  . Frequency of Social Gatherings with Friends and Family: Not on file  . Attends Religious Services: Not on file  . Active Member of Clubs or Organizations: Not on file  . Attends Archivist Meetings: Not on file  . Marital Status: Not on file     Family History: The patient's family history includes Cancer in her father; Diabetes in an other family member; Heart disease in her maternal aunt; Hypertension in an other family member; Lupus in an other family member. There is no history of Stroke, Fainting, Migraines, or Seizures. ROS:   Please see the history of present illness.     All other  systems reviewed and are negative.   EKG:  EKG is ordered today.  The ekg ordered today demonstrates NSR, 79 bpm. No changes from previous.  Recent Labs: No results found for requested labs within last 8760 hours.   Recent Lipid Panel    Component Value Date/Time   CHOL 147 09/28/2015 1217   TRIG 48 09/28/2015 1217   HDL 61 09/28/2015 1217   CHOLHDL 2.4 09/28/2015 1217   VLDL 10 09/28/2015 1217   LDLCALC 76 09/28/2015 1217    Physical Exam:    VS:  BP 110/64   Pulse 79   Ht 5\' 2"  (1.575 m)   Wt 153 lb (69.4 kg)   BMI 27.98 kg/m     Wt Readings from Last 6 Encounters:  06/10/19 153 lb (69.4 kg)  12/23/18 154 lb (69.9 kg)  09/24/18 155 lb (70.3 kg)  09/04/18 155 lb (70.3 kg)  06/09/18 153 lb (69.4 kg)  06/02/18 154 lb (69.9 kg)     Physical Exam  Constitutional: She is oriented to person, place, and time. She appears well-developed and well-nourished. No distress.  HENT:  Head: Normocephalic and atraumatic.  Neck: No JVD present.  Cardiovascular: Normal rate, regular rhythm, normal heart sounds and intact distal pulses. Exam reveals no gallop and no friction rub.   No murmur heard. Pulmonary/Chest: Effort normal and breath sounds normal. No respiratory distress. She has no wheezes. She has no rales.  Abdominal: Soft. Bowel sounds are normal.  Musculoskeletal:        General: No edema. Normal range of motion.     Cervical back: Normal range of motion and neck supple.  Neurological: She is alert and oriented to person, place, and time.  Skin: Skin is warm and dry.  Psychiatric: She has a normal mood and affect. Her behavior is normal. Judgment and thought content normal.  Vitals reviewed.   ASSESSMENT:    1. Noncompaction cardiomyopathy (HCC)   2. Palpitations   3. History of migraine    PLAN:    In order of problems listed above:  Noncompaction cardiomyopathy -Diagnosed by MRI in 2016 with EF 55%.  Most recent echo in 06/2018 with stable LV systolic function. -On low-dose beta-blocker.  Has been unable to tolerate ARB historically due to low blood pressure.  Has not needed diuretics. -She is doing very well and is able to exercise without any exertional symptoms. No HF symptoms. We reviewed what symptoms to watch for and report.  History of palpitations -No arrhythmias noted on home monitor in 07/2018. -No current palpitations.  History of migraines -Followed by neurology    Medication Adjustments/Labs and Tests Ordered: Current medicines are reviewed at length with the patient today.  Concerns regarding medicines are outlined above. Labs and tests ordered and medication changes are outlined in the patient instructions below:  Patient Instructions  Medication Instructions:  Your physician recommends that you continue on your current medications as directed. Please refer to the Current Medication list given to you today.  *If you need a refill on your cardiac medications before your next appointment, please call your pharmacy*  Lab Work: None ordered   If you have labs (blood work) drawn today and your tests are completely normal,  you will receive your results only by: 08/2018 MyChart Message (if you have MyChart) OR . A paper copy in the mail If you have any lab test that is abnormal or we need to change your treatment, we will call you to review the  results.  Testing/Procedures: None ordered   Follow-Up: At [facility], you and your health needs are our priority.  As part of our continuing mission to provide you with exceptional heart care, we have created designated Provider Care Teams.  These Care Teams include your primary Cardiologist (physician) and Advanced Practice Providers (APPs -  Physician Assistants and Nurse Practitioners) who all work together to provide you with the care you need, when you need it.  Your next appointment:   6 month(s)  The format for your next appointment:   In Person  Provider:   You may see Dietrich Pates, MD or one of the following Advanced Practice Providers on your designated Care Team:    Tereso Newcomer, PA-C  [place]in [place], [place]  Berton Bon, NP   Other Instructions  Lifestyle Modifications to Prevent and Treat Heart Disease -Recommend heart healthy/Mediterranean diet, with whole grains, fruits, vegetables, fish, lean meats, nuts, olive oil and avocado oil.  -Limit salt intake to less than 2000 mg per day.  -Recommend moderate walking, starting slowly with a few minutes and working up to 3-5 times/week for 30-50 minutes each session. Aim for at least 150 minutes.week. Goal should be pace of 3 miles/hours, or walking 1.5 miles in 30 minutes -Recommend avoidance of tobacco products. Avoid excess alcohol. -Keep blood pressure well controlled, ideally less than 130/80.      Signed, Berton Bon, NP  06/10/2019 11:46 AM    [facility]

## 2019-06-10 ENCOUNTER — Encounter: Payer: Self-pay | Admitting: Cardiology

## 2019-06-10 ENCOUNTER — Other Ambulatory Visit: Payer: Self-pay

## 2019-06-10 ENCOUNTER — Ambulatory Visit (INDEPENDENT_AMBULATORY_CARE_PROVIDER_SITE_OTHER): Payer: BC Managed Care – PPO | Admitting: Cardiology

## 2019-06-10 VITALS — BP 110/64 | HR 79 | Ht 62.0 in | Wt 153.0 lb

## 2019-06-10 DIAGNOSIS — R002 Palpitations: Secondary | ICD-10-CM

## 2019-06-10 DIAGNOSIS — I428 Other cardiomyopathies: Secondary | ICD-10-CM

## 2019-06-10 DIAGNOSIS — Z8669 Personal history of other diseases of the nervous system and sense organs: Secondary | ICD-10-CM

## 2019-06-10 NOTE — Patient Instructions (Addendum)
Medication Instructions:  Your physician recommends that you continue on your current medications as directed. Please refer to the Current Medication list given to you today.  *If you need a refill on your cardiac medications before your next appointment, please call your pharmacy*  Lab Work: None ordered   If you have labs (blood work) drawn today and your tests are completely normal, you will receive your results only by: Marland Kitchen MyChart Message (if you have MyChart) OR . A paper copy in the mail If you have any lab test that is abnormal or we need to change your treatment, we will call you to review the results.  Testing/Procedures: None ordered   Follow-Up: At [facility], you and your health needs are our priority.  As part of our continuing mission to provide you with exceptional heart care, we have created designated Provider Care Teams.  These Care Teams include your primary Cardiologist (physician) and Advanced Practice Providers (APPs -  Physician Assistants and Nurse Practitioners) who all work together to provide you with the care you need, when you need it.  Your next appointment:   6 month(s)  The format for your next appointment:   In Person  Provider:   You may see Dietrich Pates, MD or one of the following Advanced Practice Providers on your designated Care Team:    Tereso Newcomer, PA-C  Vin [place], [place]  Berton Bon, NP   Other Instructions  Lifestyle Modifications to Prevent and Treat Heart Disease -Recommend heart healthy/Mediterranean diet, with whole grains, fruits, vegetables, fish, lean meats, nuts, olive oil and avocado oil.  -Limit salt intake to less than 2000 mg per day.  -Recommend moderate walking, starting slowly with a few minutes and working up to 3-5 times/week for 30-50 minutes each session. Aim for at least 150 minutes.week. Goal should be pace of 3 miles/hours, or walking 1.5 miles in 30 minutes -Recommend avoidance of tobacco products. Avoid  excess alcohol. -Keep blood pressure well controlled, ideally less than 130/80.

## 2019-06-17 ENCOUNTER — Other Ambulatory Visit: Payer: Self-pay | Admitting: Physician Assistant

## 2019-06-17 ENCOUNTER — Other Ambulatory Visit: Payer: Self-pay | Admitting: Neurology

## 2019-07-18 ENCOUNTER — Other Ambulatory Visit: Payer: Self-pay | Admitting: Neurology

## 2019-08-04 ENCOUNTER — Other Ambulatory Visit: Payer: Self-pay

## 2019-08-04 ENCOUNTER — Telehealth: Payer: BC Managed Care – PPO | Admitting: Neurology

## 2019-08-04 ENCOUNTER — Encounter: Payer: Self-pay | Admitting: Neurology

## 2019-08-04 ENCOUNTER — Telehealth (INDEPENDENT_AMBULATORY_CARE_PROVIDER_SITE_OTHER): Payer: BC Managed Care – PPO | Admitting: Neurology

## 2019-08-04 DIAGNOSIS — G43109 Migraine with aura, not intractable, without status migrainosus: Secondary | ICD-10-CM

## 2019-08-04 DIAGNOSIS — R55 Syncope and collapse: Secondary | ICD-10-CM | POA: Diagnosis not present

## 2019-08-04 MED ORDER — TRAMADOL HCL 50 MG PO TABS: ORAL_TABLET | ORAL | 5 refills | Status: DC

## 2019-08-04 MED ORDER — VENLAFAXINE HCL ER 37.5 MG PO CP24: ORAL_CAPSULE | ORAL | 11 refills | Status: DC

## 2019-08-04 NOTE — Progress Notes (Signed)
Virtual Visit via Video Note The purpose of this virtual visit is to provide medical care while limiting exposure to the novel coronavirus.    Consent was obtained for video visit:  Yes.   Answered questions that patient had about telehealth interaction:  Yes.   I discussed the limitations, risks, security and privacy concerns of performing an evaluation and management service by telemedicine. I also discussed with the patient that there may be a patient responsible charge related to this service. The patient expressed understanding and agreed to proceed.  Pt location: Private vehicle Physician Location: office Name of referring provider:  Dolan Amen, FNP I connected with Tasha Ross at patients initiation/request on 08/04/2019 at  9:30 AM EDT by video enabled telemedicine application and verified that I am speaking with the correct person using two identifiers. Pt MRN:  [id number] Pt DOB:  [birth year] Video Participants:  Tasha Ross   History of Present Illness:  The patient had a virtual video visit on 08/04/2019. She was last seen 7 months ago for vertigo, headaches, and syncope. MRI brain unremarkable. She had an abnormal echo and holter monitor with heart rates up to 180-190 bpm, amitriptyline felt to be potential cause. 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 No further syncopal episodes since April 2018. She has had an increase in headaches, currently on Effexor 37.5mg  1 tab qhs, no side effects. She reports headaches every 2 days and has prn Tramadol that she does not use all the day. She usually gets 5-6 hours of sleep. Ambien made her sluggish so she does not take I regularly. She reports a lot of stress but nothing she cannot work through. She denies any focal numbness/tingling/weakness, no falls. Mood good overall.   History on Initial Assessment 01/17/2015: This is a 44 yo RH woman presenting with  vertigo, headaches, and episodes of passing out that started in 2009 or 2010. She had seen neurologist Dr. Shane Crutch in 2012 while living in [place], reporting that she is having the exact same symptoms she had, they had gotten under control while she was seeing him, with no episodes for a year, until January 2016 as she was transitioning for her move to [facility] last March 2016. She reports migraines followed a couple of days later by vertigo. She would brace herself, trying to sit still, but would lose consciousness. She has fallen to the floor at work. Sometimes she can brace herself, other times there is no warning. Symptoms are brief, she would wake up on the floor. She was admitted to [facility] 10/22/14 for near syncope, she reported right-sided weakness with slurred speech, that improved during transport. Glucose noted to be 72. She had an MRI brain without contrast with no acute intracranial changes. MRA brain reported a 1.27mm bulbous appearance of the distal left PICA branches in the region of the inferior cerebellar vermis. It may represent a vascular loop, however a small aneurysm is not entirely excluded. 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 Records from her previous neurologist were reviewed. He was concerned that dizziness and vertigo were not physiological in nature. He had done an MRI/MRA brain reported as normal as well. TSH, RPR, CBC normal. B12 low normal at 278, she had received replacement treatment. For migraine prophylaxis, she was started on amitriptyline in 2012, then switched to Topamax in 2013. She reports migraines are retro-orbital, sometimes dull or sharp pain, with phonophobia,  nausea. She recalls taking amitriptyline and Topamax, and states these were stopped because the episodes stopped. She recalls taking Imitrex, Fioricet, Maxalt which would help initially. She feels the amitriptyline and citalopram in the past helped put her  in a calm mode. She has been having more frequent migraines occurring twice a week, lasting 3-4 hours. Ibuprofen does not help. The vertigo occurs a couple of days after a migraine, with spinning sensation lasting up to a week. She usually gets less than 6 hours of sleep, feeling "real agitated." She reports the last syncopal episode was in June. When asked about stress, she does indicate that she has been working for [employer] since 2014, but had to move to a different location with her move in January.   She had a syncopal episode at work last 08/19/16 and has had a headache, neck pain, and seeing floaters since then. 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 No convulsive activity seen on video. She came to and recalls feeling sick to her stomach and unable to talk, with her words not coming out clear. She reports another syncopal episode in March on the way to work, she felt a pressure in her chest. She was recently treated with antibiotic for congestion, but has started feeling ill again. She wonders if weather changes are a trigger, she has become more hypersensitive to smells since Spring started.   Prior preventative medications: amitriptyline, topamax, Toprol, zonisamide, citalopram Prior rescue medications: imitrex, relpax, maxalt, zomig, fioricet   Outpatient Encounter Medications as of 08/04/2019  Medication Sig  . azelastine (ASTELIN) 0.1 % nasal spray Place 2 sprays into both nostrils daily.  . fluticasone (FLONASE) 50 MCG/ACT nasal spray Place 2 sprays into both nostrils daily.  . metoprolol succinate (TOPROL-XL) 25 MG 24 hr tablet TAKE 1 TABLET BY MOUTH EVERY DAY  . traMADol (ULTRAM) 50 MG tablet Take 1 tablet as needed for severe migraine. Do not take more than 2-3 a week  . venlafaxine XR (EFFEXOR-XR) 37.5 MG 24 hr capsule TAKE 2 CAPSULES AT NIGHT (Patient taking 1 cap every night)  .  zolpidem (AMBIEN) 5 MG tablet Take 5 mg by mouth at bedtime as needed.  .    .     No facility-administered encounter medications on file as of 08/04/2019.     Observations/Objective:   GEN:  The patient appears stated age and is in NAD. Neurological examination: Patient is awake, alert, oriented x 3. No aphasia or dysarthria. Intact fluency and comprehension. Remote and recent memory intact. Cranial nerves: Extraocular movements intact with no nystagmus. No facial asymmetry. Motor: moves all extremities symmetrically, at least anti-gravity x 4.   Assessment and Plan:   This is a pleasant 44 yo RH woman with a history of headaches, dizziness, and syncope since 2009 or 2010, symptoms had quieted down, then recurred at the beginning of 2016. On review of records, her previous neurologist was concerned dizziness and vertigo were not physiologic in nature. She did not tolerate amitriptyline for headache prophylaxis, in addition, Cardiology felt this was causing SVT on holter monitor. She is on Toprol for SVT with no further syncopal episodes since April 2018. She is having more headaches, increase Effexor 37.5mg  to 2 caps qhs. She has prn Tramadol for severe headaches and knows to minimize rescue medication to 2-3 times a week to avoid rebound headaches. Follow-up in 6-8 months, she  knows to call for any changes.   Follow Up Instructions:   -I discussed the assessment and treatment plan with the patient. The patient was provided an opportunity to ask questions and all were answered. The patient agreed with the plan and demonstrated an understanding of the instructions.   The patient was advised to call back or seek an in-person evaluation if the symptoms worsen or if the condition fails to improve as anticipated.     Van Clines, MD

## 2019-08-23 ENCOUNTER — Ambulatory Visit: Payer: BC Managed Care – PPO | Admitting: Neurology

## 2019-12-19 ENCOUNTER — Telehealth: Payer: Self-pay | Admitting: Neurology

## 2019-12-19 MED ORDER — PREDNISONE 20 MG PO TABS: ORAL_TABLET | ORAL | 0 refills | Status: DC

## 2019-12-19 NOTE — Telephone Encounter (Signed)
Spoke with pt she would like to try the prednisone and have it sent to the [facility] on file in [place]

## 2019-12-19 NOTE — Addendum Note (Signed)
Addended by: Van Clines on: 12/19/2019 10:30 AM   Modules accepted: Orders

## 2019-12-19 NOTE — Telephone Encounter (Signed)
Spoke with pt she is not in [place] is in [place] at a friends house right now, she does not have a ride at this time, unsure of when she may have a ride

## 2019-12-19 NOTE — Telephone Encounter (Signed)
Sent. Pls let her know to take in the mornings with food. Thanks

## 2019-12-19 NOTE — Telephone Encounter (Signed)
Has she been on prednisone in the past with no problems? If none, we can try a 1-week course of prednisone to try to break her headaches.

## 2019-12-19 NOTE — Telephone Encounter (Signed)
Pt called back and was informed  to take in the mornings with food. Pt verbalized understanding

## 2019-12-19 NOTE — Telephone Encounter (Signed)
Can she come to the office for a migraine cocktail? She will need a driver. Thanks

## 2019-12-19 NOTE — Telephone Encounter (Signed)
Patient called in stating she has been having a migraine since yesterday. She can't get it to go away. She would like to see if she can be prescribed something so she doesn't have to go to the emergency room.

## 2020-02-04 DIAGNOSIS — R222 Localized swelling, mass and lump, trunk: Secondary | ICD-10-CM | POA: Insufficient documentation

## 2020-03-07 ENCOUNTER — Ambulatory Visit: Payer: BC Managed Care – PPO | Admitting: Neurology

## 2020-03-07 ENCOUNTER — Encounter: Payer: Self-pay | Admitting: Neurology

## 2020-03-07 DIAGNOSIS — Z029 Encounter for administrative examinations, unspecified: Secondary | ICD-10-CM

## 2020-04-18 DIAGNOSIS — K439 Ventral hernia without obstruction or gangrene: Secondary | ICD-10-CM | POA: Insufficient documentation

## 2020-09-06 DIAGNOSIS — M25571 Pain in right ankle and joints of right foot: Secondary | ICD-10-CM | POA: Insufficient documentation

## 2020-09-07 ENCOUNTER — Encounter: Payer: Self-pay | Admitting: Podiatry

## 2020-09-07 ENCOUNTER — Other Ambulatory Visit: Payer: Self-pay

## 2020-09-07 ENCOUNTER — Ambulatory Visit (INDEPENDENT_AMBULATORY_CARE_PROVIDER_SITE_OTHER): Payer: BC Managed Care – PPO | Admitting: Podiatry

## 2020-09-07 DIAGNOSIS — L600 Ingrowing nail: Secondary | ICD-10-CM

## 2020-09-07 DIAGNOSIS — M79676 Pain in unspecified toe(s): Secondary | ICD-10-CM | POA: Diagnosis not present

## 2020-09-07 NOTE — Progress Notes (Signed)
  Subjective:  Patient ID: Tasha Ross, female    DOB: 1975/07/22,  MRN: [id number]  Chief Complaint  Patient presents with  . Nail Problem    Hallux bilateral - toenail discolored and splitting x few months, tried OTC antifungal  . New Patient (Initial Visit)   45 y.o. female presents with the above complaint. History confirmed with patient.   Objective:  Physical Exam: warm, good capillary refill, no trophic changes or ulcerative lesions, normal DP and PT pulses and normal sensory exam. Bilateral hallux nail distal lysis. Left Foot: normal exam, no swelling, tenderness, instability; ligaments intact, full range of motion of all ankle/foot joints  Right Foot: normal exam, no swelling, tenderness, instability; ligaments intact, full range of motion of all ankle/foot joints  Assessment:   1. Ingrown nail   2. Pain around toenail    Plan:  Patient was evaluated and treated and all questions answered.  Nail dystrophy -Debrided of loose nail. Irritated the leading skin edge to promote acceptance of new nail growth. -F/u should she not be pleased with progress.  No follow-ups on file.

## 2020-10-01 ENCOUNTER — Emergency Department: Payer: BC Managed Care – PPO

## 2020-10-01 ENCOUNTER — Emergency Department
Admission: EM | Admit: 2020-10-01 | Discharge: 2020-10-01 | Disposition: A | Discharge status: Home / Self Care | Payer: BC Managed Care – PPO | Attending: Emergency Medicine | Admitting: Emergency Medicine

## 2020-10-01 ENCOUNTER — Other Ambulatory Visit: Payer: Self-pay

## 2020-10-01 DIAGNOSIS — R1013 Epigastric pain: Secondary | ICD-10-CM | POA: Insufficient documentation

## 2020-10-01 DIAGNOSIS — R0789 Other chest pain: Secondary | ICD-10-CM | POA: Insufficient documentation

## 2020-10-01 LAB — BASIC METABOLIC PANEL
Anion gap: 10 (ref 5–15)
BUN: 17 mg/dL (ref 6–20)
CO2: 20 mmol/L — ABNORMAL LOW (ref 22–32)
Calcium: 8.8 mg/dL — ABNORMAL LOW (ref 8.9–10.3)
Chloride: 107 mmol/L (ref 98–111)
Creatinine, Ser: 0.6 mg/dL (ref 0.44–1.00)
GFR, Estimated: >60 mL/min (ref >60)
Glucose, Bld: 92 mg/dL (ref 70–99)
Potassium: 3.7 mmol/L (ref 3.5–5.1)
Sodium: 137 mmol/L (ref 135–145)

## 2020-10-01 LAB — CBC
HCT: 38.5 % (ref 36.0–46.0)
Hemoglobin: 12.8 g/dL (ref 12.0–15.0)
MCH: 28.4 pg (ref 26.0–34.0)
MCHC: 33.2 g/dL (ref 30.0–36.0)
MCV: 85.4 fL (ref 80.0–100.0)
Platelets: 338 10*3/uL (ref 150–400)
RBC: 4.51 MIL/uL (ref 3.87–5.11)
RDW: 13.1 % (ref 11.5–15.5)
WBC: 7 10*3/uL (ref 4.0–10.5)
nRBC: 0 % (ref 0.0–0.2)

## 2020-10-01 LAB — TROPONIN I (HIGH SENSITIVITY)
Troponin I (High Sensitivity): <2 ng/L (ref <18)
Troponin I (High Sensitivity): <2 ng/L (ref <18)

## 2020-10-01 MED ORDER — LIDOCAINE VISCOUS HCL 2 % MT SOLN
15.0000 mL | Freq: Once | OROMUCOSAL | Status: AC
  Administered 2020-10-01: 15 mL via ORAL
  Filled 2020-10-01: qty 15

## 2020-10-01 MED ORDER — PANTOPRAZOLE SODIUM 20 MG PO TBEC: 20.0000 mg | DELAYED_RELEASE_TABLET | Freq: Every day | ORAL | 1 refills | Status: DC

## 2020-10-01 MED ORDER — ALUM & MAG HYDROXIDE-SIMETH 200-200-20 MG/5ML PO SUSP
30.0000 mL | Freq: Once | ORAL | Status: AC
  Administered 2020-10-01: 30 mL via ORAL
  Filled 2020-10-01: qty 30

## 2020-10-01 NOTE — ED Notes (Signed)
EDP Kinner at bedside.  

## 2020-10-01 NOTE — ED Provider Notes (Signed)
[facility] Emergency Department Provider Note   ____________________________________________    I have reviewed the triage vital signs and the nursing notes.   HISTORY  Chief Complaint Chest Pain     HPI Tasha Ross is a 45 y.o. female with a history of cardiomyopathy who presents with complaints of epigastric pain and chest tightness.  Patient reports she developed epigastric pain yesterday evening after eating, she reports it was burning in intensity.  Overnight she developed tightness in her chest with no radiation.  She reports improvement with sitting up.  Denies fevers chills or cough.  No shortness of breath reported.  Feeling somewhat improved now.  She reports he has had a similar episode in the past.  No pleurisy, no calf pain or swelling.  Past Medical History:  Diagnosis Date  . Cardiomyopathy (HCC)    a. Echo 01/2015: EF 45-50% with diffuse HK. // Echo 06/2018: EF 55, no RWMA, prominent apical trabeculation  . History of stress test    a. ETT 11/16:  normal  . Migraine   . Near syncope   . Snoring 04/06/2015  . SVT (supraventricular tachycardia) (HCC)    a. Seen on event monitor 01/2015.  Marland Kitchen Vertigo     Patient Active Problem List   Diagnosis Date Noted  . Epigastric hernia 04/18/2020  . Abdominal wall mass 02/04/2020  . Allergies 08/20/2018  . Cardiomyopathy (HCC) 12/18/2015  . Insomnia 04/19/2015  . Snoring 04/06/2015  . Head revolving around 02/06/2015  . Complicated migraine 01/17/2015  . Faintness 01/17/2015    Past Surgical History:  Procedure Laterality Date  . CYSTECTOMY     from ovaries  . TUBAL LIGATION Bilateral   . VAGINAL HYSTERECTOMY      Prior to Admission medications   Medication Sig Start Date End Date Taking? Authorizing Provider  pantoprazole (PROTONIX) 20 MG tablet Take 1 tablet (20 mg total) by mouth daily. 10/01/20 10/01/21 Yes Jene Every, MD  azelastine (ASTELIN) 0.1 % nasal spray Place 2  sprays into both nostrils daily. 05/31/16   [provider]  fluticasone (FLONASE) 50 MCG/ACT nasal spray Place 2 sprays into both nostrils daily.    [provider]  metoprolol succinate (TOPROL-XL) 25 MG 24 hr tablet TAKE 1 TABLET BY MOUTH EVERY DAY 06/17/19   Berton Bon, NP  venlafaxine XR (EFFEXOR-XR) 37.5 MG 24 hr capsule TAKE 2 CAPSULES AT NIGHT 08/04/19   Van Clines, MD  zolpidem (AMBIEN) 5 MG tablet Take 5 mg by mouth at bedtime as needed. 07/18/19   [provider]     Allergies Ibuprofen, Amitriptyline, Clindamycin, Elemental sulfur, Sulfa antibiotics, and Sulfamethoxazole  Family History  Problem Relation Age of Onset  . Cancer Father   . Hypertension Other   . Lupus Other   . Diabetes Other   . Heart disease Maternal Aunt        Patient does not know details  . Stroke Neg Hx   . Fainting Neg Hx   . Migraines Neg Hx   . Seizures Neg Hx     Social History Social History   Tobacco Use  . Smoking status: Never Smoker  . Smokeless tobacco: Never Used  Vaping Use  . Vaping Use: Never used  Substance Use Topics  . Alcohol use: Yes    Alcohol/week: 0.0 standard drinks    Comment: Rare - once or twice a month.  . Drug use: No    Review of Systems  Constitutional: No fever/chills Eyes: No visual changes.  ENT: No sore throat. Cardiovascular: As above Respiratory: Denies shortness of breath. Gastrointestinal: As above.   Genitourinary: Negative for dysuria. Musculoskeletal: Negative for back pain. Skin: Negative for rash. Neurological: Negative for headaches or weakness   ____________________________________________   PHYSICAL EXAM:  VITAL SIGNS: ED Triage Vitals  Enc Vitals Group     BP 10/01/20 0645 (!) 142/105     Pulse Rate 10/01/20 0645 100     Resp 10/01/20 0645 18     Temp 10/01/20 0645 98.2 F (36.8 C)     Temp src --      SpO2 10/01/20 0645 98 %     Weight 10/01/20 0644 72.6 kg (160 lb)     Height 10/01/20  0644 1.575 m (5\' 2" )     Head Circumference --      Peak Flow --      Pain Score 10/01/20 0644 10     Pain Loc --      Pain Edu? --      Excl. in GC? --     Constitutional: Alert and oriented. No acute distress. Pleasant and interactive  Nose: No congestion/rhinnorhea. Mouth/Throat: Mucous membranes are moist.    Cardiovascular: Normal rate, regular rhythm. Grossly normal heart sounds.  Good peripheral circulation. Respiratory: Normal respiratory effort.  No retractions. Lungs CTAB. Gastrointestinal: Soft and nontender. No distention.  No CVA tenderness.  Musculoskeletal:   Warm and well perfused Neurologic:  Normal speech and language. No gross focal neurologic deficits are appreciated.  Skin:  Skin is warm, dry and intact. No rash noted. Psychiatric: Mood and affect are normal. Speech and behavior are normal.  ____________________________________________   LABS (all labs ordered are listed, but only abnormal results are displayed)  Labs Reviewed  BASIC METABOLIC PANEL - Abnormal; Notable for the following components:      Result Value   CO2 20 (*)    Calcium 8.8 (*)    All other components within normal limits  CBC  POC URINE PREG, ED  TROPONIN I (HIGH SENSITIVITY)  TROPONIN I (HIGH SENSITIVITY)   ____________________________________________  EKG  ED ECG REPORT I, 10/03/20, the attending physician, personally viewed and interpreted this ECG.  Date: 10/01/2020  Rhythm: Sinus tachycardia QRS Axis: normal Intervals: normal ST/T Wave abnormalities: normal Narrative Interpretation: no evidence of acute ischemia, baseline artifact  ____________________________________________  RADIOLOGY  chest x-ray reviewed by me, no acute abnormality ____________________________________________   PROCEDURES  Procedure(s) performed: No  Procedures   Critical Care performed: No ____________________________________________   INITIAL IMPRESSION / ASSESSMENT AND  PLAN / ED COURSE  Pertinent labs & imaging results that were available during my care of the patient were reviewed by me and considered in my medical decision making (see chart for details).  Patient presents with epigastric discomfort and chest pain as above.  Suspicious for gastritis/GERD.  EKG is not consistent with ACS, high sensitive troponin is undetectable.  Chest x-ray is reassuring, no edema or effusion.  No evidence of anemia normal white blood cell count.  Vital signs are quite reassuring.  Will treat with GI cocktail while pending second troponin  Patient with significant improvement after GI cocktail.  Second troponin undetectable.  She is reassured, appropriate for discharge at this time given resolution of symptoms, delta troponin normal.  EKG reassuring, chest x-ray unremarkable.  Strict return precautions discussed, will start the patient on Protonix for possible gastritis, she will follow-up with her cardiologist closely  ____________________________________________   FINAL CLINICAL IMPRESSION(S) / ED DIAGNOSES  Final diagnoses:  Atypical chest pain        Note:  This document was prepared using Dragon voice recognition software and may include unintentional dictation errors.   Jene Every, MD 10/01/20 [phone]

## 2020-10-01 NOTE — ED Triage Notes (Signed)
Pt states her chest started hurting last night described as a squeezing feeling, pt denies dizzy or light headed. Pt states she does have some sob and a stiff neck. Pt states she has had this pain in the past.

## 2020-10-19 DIAGNOSIS — M47816 Spondylosis without myelopathy or radiculopathy, lumbar region: Secondary | ICD-10-CM | POA: Insufficient documentation

## 2020-10-23 ENCOUNTER — Encounter: Payer: Self-pay | Admitting: Gastroenterology

## 2020-11-22 ENCOUNTER — Ambulatory Visit: Payer: BC Managed Care – PPO | Admitting: Neurology

## 2020-11-30 ENCOUNTER — Encounter: Payer: Self-pay | Admitting: Gastroenterology

## 2020-11-30 ENCOUNTER — Ambulatory Visit (INDEPENDENT_AMBULATORY_CARE_PROVIDER_SITE_OTHER): Payer: BC Managed Care – PPO | Admitting: Gastroenterology

## 2020-11-30 VITALS — BP 112/70 | HR 76 | Ht 61.75 in | Wt 159.4 lb

## 2020-11-30 DIAGNOSIS — G8929 Other chronic pain: Secondary | ICD-10-CM

## 2020-11-30 DIAGNOSIS — Z800 Family history of malignant neoplasm of digestive organs: Secondary | ICD-10-CM | POA: Diagnosis not present

## 2020-11-30 DIAGNOSIS — R1013 Epigastric pain: Secondary | ICD-10-CM | POA: Diagnosis not present

## 2020-11-30 NOTE — Patient Instructions (Signed)
If you are age 44 or older, your body mass index should be between 23-30. Your Body mass index is 29.39 kg/m. If this is out of the aforementioned range listed, please consider follow up with your Primary Care Provider.  If you are age 70 or younger, your body mass index should be between 19-25. Your Body mass index is 29.39 kg/m. If this is out of the aformentioned range listed, please consider follow up with your Primary Care Provider.   You have been scheduled for an endoscopy. Please follow written instructions given to you at your visit today. If you use inhalers (even only as needed), please bring them with you on the day of your procedure.   The [facility] providers would like to encourage you to use [facility] to communicate with providers for non-urgent requests or questions.  Due to long hold times on the telephone, sending your provider a message by [facility] may be a faster and more efficient way to get a response.  Please allow 48 business hours for a response.  Please remember that this is for non-urgent requests.   It was a pleasure to see you today!  Thank you for trusting me with your gastrointestinal care!    Scott E. Tomasa Rand, MD

## 2020-11-30 NOTE — Progress Notes (Signed)
HPI : Tasha Ross is a pleasant 45 year old female referred for chronic epigastric pain.  The patient states that she has had this abdominal pain off and on for about a year now.  She was found to have a ventral hernia which was repaired in January, but the pain did not go away completely.  The pain is located in the epigastrium and supra umbilical region.  It is usually associated with nausea but no vomiting.  It is often sharp in character.  It is not burning in quality.  She denies any typical GERD symptoms such as heartburn or acid regurgitation.  No dysphagia.  Her bowel  movements are regular, usually 2 per day, with formed soft stools.  She denies constipation, diarrhea or blood in the stool.  Her weight has been stable. End of May, the patient went to the emergency room with significant chest pain which was squeezing in nature.  EKG and troponins are unremarkable.  Her symptoms improved with a GI cocktail.  She was prescribed Protonix after this visit, and took this daily for 30 days, but this did not seem to improve her abdominal pain. She does not take NSAIDs (she is prescribed naproxen, but says she does not take it)..  She avoids fried foods. Her father was diagnosed with stomach cancer in his 96s, and treated surgically.  He is still living.  Past Medical History:  Diagnosis Date   Cardiomyopathy [facility])    a. Echo 01/2015: EF 45-50% with diffuse HK. // Echo 06/2018: EF 55, no RWMA, prominent apical trabeculation   History of stress test    a. ETT 11/16:  normal   Migraine    Near syncope    Snoring 04/06/2015   SVT (supraventricular tachycardia) (HCC)    a. Seen on event monitor 01/2015.   Vertigo      Past Surgical History:  Procedure Laterality Date   EPIGASTRIC HERNIA REPAIR     TONSILLECTOMY     TUBAL LIGATION Bilateral    VAGINAL HYSTERECTOMY     Family History  Problem Relation Age of Onset   Stomach cancer Father    Leukemia Maternal Grandmother    Diabetes Maternal  Grandmother    Glaucoma Paternal Grandmother    Dementia Paternal Grandmother    Hypertension Paternal Grandmother    Heart disease Maternal Aunt        Patient does not know details   Ovarian cancer Maternal Aunt    Hypertension Maternal Aunt    Hypertension Other    Diabetes Other    Lupus Paternal Aunt    Lupus Cousin    Throat cancer Paternal Uncle    Stroke Neg Hx    Fainting Neg Hx    Migraines Neg Hx    Seizures Neg Hx    Social History   Tobacco Use   Smoking status: Never   Smokeless tobacco: Never  Vaping Use   Vaping Use: Never used  Substance Use Topics   Alcohol use: Yes    Alcohol/week: 0.0 standard drinks    Comment: Rare - once or twice a month.   Drug use: No   Current Outpatient Medications  Medication Sig Dispense Refill   azelastine (ASTELIN) 0.1 % nasal spray Place 2 sprays into both nostrils daily.  11   fluticasone (FLONASE) 50 MCG/ACT nasal spray Place 2 sprays into both nostrils daily.     metoprolol succinate (TOPROL-XL) 25 MG 24 hr tablet TAKE 1 TABLET BY MOUTH EVERY DAY 30  tablet 11   naproxen (NAPROSYN) 500 MG tablet Take 500 mg by mouth as needed.     rizatriptan (MAXALT) 10 MG tablet Take 1 tablet by mouth as needed.     tiZANidine (ZANAFLEX) 4 MG tablet Take 4 mg by mouth as needed.     venlafaxine XR (EFFEXOR-XR) 37.5 MG 24 hr capsule TAKE 2 CAPSULES AT NIGHT (Patient taking differently: TAKE 1 CAPSULES AT NIGHT) 60 capsule 11   zolpidem (AMBIEN) 5 MG tablet Take 5 mg by mouth at bedtime as needed.     pantoprazole (PROTONIX) 20 MG tablet Take 1 tablet (20 mg total) by mouth daily. (Patient not taking: Reported on 11/30/2020) 30 tablet 1   No current facility-administered medications for this visit.   Allergies  Allergen Reactions   Ibuprofen Other (See Comments) and Rash    Per Neurologist request Per Neurologist request   Amitriptyline Other (See Comments) and Rash    Causing headaches and head pains Causing headaches and head  pains   Clindamycin Rash   Elemental Sulfur Swelling   Sulfa Antibiotics Swelling and Rash   Sulfamethoxazole Swelling     Review of Systems: All systems reviewed and negative except where noted in HPI.    No results found.  Physical Exam: BP 112/70 (BP Location: Left Arm, Patient Position: Sitting, Cuff Size: Normal)   Pulse 76   Ht 5' 1.75" (1.568 m) Comment: height measured without shoes  Wt 159 lb 6 oz (72.3 kg)   BMI 29.39 kg/m  Constitutional: Pleasant,well-developed, African-American female in no acute distress. HEENT: Normocephalic and atraumatic. Conjunctivae are normal. No scleral icterus.  Mallampati 2 Cardiovascular: Normal rate, regular rhythm.  Pulmonary/chest: Effort normal and breath sounds normal. No wheezing, rales or rhonchi. Abdominal: Soft, nondistended, nontender. Bowel sounds active throughout. There are no masses palpable. No hepatomegaly.  Well-healed midline vertical surgical scar in epigastrium Extremities: no edema Neurological: Alert and oriented to person place and time. Skin: Skin is warm and dry. No rashes noted. Psychiatric: Normal mood and affect. Behavior is normal.  CBC    Component Value Date/Time   WBC 7.0 10/01/2020 0648   RBC 4.51 10/01/2020 0648   HGB 12.8 10/01/2020 0648   HCT 38.5 10/01/2020 0648   PLT 338 10/01/2020 0648   MCV 85.4 10/01/2020 0648   MCH 28.4 10/01/2020 0648   MCHC 33.2 10/01/2020 0648   RDW 13.1 10/01/2020 0648   LYMPHSABS 0.8 04/11/2015 1952   MONOABS 1.5 (H) 04/11/2015 1952   EOSABS 0.0 04/11/2015 1952   BASOSABS 0.0 04/11/2015 1952    CMP     Component Value Date/Time   NA 137 10/01/2020 0648   NA 139 06/02/2018 0915   K 3.7 10/01/2020 0648   CL 107 10/01/2020 0648   CO2 20 (L) 10/01/2020 0648   GLUCOSE 92 10/01/2020 0648   BUN 17 10/01/2020 0648   BUN 14 06/02/2018 0915   CREATININE 0.60 10/01/2020 0648   CREATININE 0.63 11/26/2015 1101   CALCIUM 8.8 (L) 10/01/2020 0648   PROT 7.5  01/25/2015 0937   ALBUMIN 3.7 01/25/2015 0937   AST 17 01/25/2015 0937   ALT 11 (L) 01/25/2015 0937   ALKPHOS 42 01/25/2015 0937   BILITOT 0.6 01/25/2015 0937   GFRNONAA >60 10/01/2020 0648   GFRAA 128 06/02/2018 0915     ASSESSMENT AND PLAN: 45 y/o fm with chronic (1 year) of bothersome epigastric pain with nausea.  No reliable relationship to meals/eating.  No red flag symptoms.  She is prescribed naproxen, but she denies taking it.  No lower GI symptoms.  No GERD symptoms.  No improvement with once daily Protonix.  She has a family history of stomach cancer (father, 89s).  We discussed the differential for chronic epigastric pain to include peptic ulcer disease, nonulcer dyspepsia, symptomatic cholelithiasis, chronic pancreatitis, as well as gut brain axis disorders. Given her lack of response to PPI and her family history of stomach cancer, we will proceed with an upper endoscopy to assess for peptic ulcer disease, H. pylori and rule out malignancy.  We will make further recommendations for evaluation and medical therapy after the EGD.  Epigastric pain - Upper endoscopy  The details, risks (including bleeding, perforation, infection, missed lesions, medication reactions and possible hospitalization or surgery if complications occur), benefits, and alternatives to EGD with possible biopsy and possible polypectomy were discussed with the patient and she consents to proceed.   Sissy Goetzke E. Tomasa Rand, MD [facility]

## 2020-12-07 ENCOUNTER — Encounter: Payer: Self-pay | Admitting: Gastroenterology

## 2020-12-14 ENCOUNTER — Encounter: Payer: Self-pay | Admitting: Gastroenterology

## 2020-12-14 ENCOUNTER — Ambulatory Visit (AMBULATORY_SURGERY_CENTER): Payer: BC Managed Care – PPO | Admitting: Gastroenterology

## 2020-12-14 ENCOUNTER — Other Ambulatory Visit: Payer: Self-pay

## 2020-12-14 VITALS — BP 117/83 | HR 74 | Temp 98.0°F | Resp 27 | Ht 61.0 in | Wt 159.0 lb

## 2020-12-14 DIAGNOSIS — R1013 Epigastric pain: Secondary | ICD-10-CM

## 2020-12-14 DIAGNOSIS — K208 Other esophagitis without bleeding: Secondary | ICD-10-CM | POA: Diagnosis not present

## 2020-12-14 DIAGNOSIS — K2950 Unspecified chronic gastritis without bleeding: Secondary | ICD-10-CM | POA: Diagnosis not present

## 2020-12-14 DIAGNOSIS — K2970 Gastritis, unspecified, without bleeding: Secondary | ICD-10-CM

## 2020-12-14 MED ORDER — SODIUM CHLORIDE 0.9 % IV SOLN: 500.0000 mL | Freq: Once | INTRAVENOUS | Status: DC

## 2020-12-14 MED ORDER — PANTOPRAZOLE SODIUM 20 MG PO TBEC: 20.0000 mg | DELAYED_RELEASE_TABLET | Freq: Every day | ORAL | 1 refills | Status: DC

## 2020-12-14 MED ORDER — PANTOPRAZOLE SODIUM 20 MG PO TBEC: 20.0000 mg | DELAYED_RELEASE_TABLET | Freq: Every day | ORAL | 3 refills | Status: DC

## 2020-12-14 NOTE — Progress Notes (Signed)
Vocal Order from Dr. Tiajuana Amass, continue taking Pantoprazole (Protinix) 20 mg daily #90 refill x3.  Rx was sent to pt's pharmacy - [facility].    No problems noted in the recovery room. maw

## 2020-12-14 NOTE — Op Note (Signed)
[facility] Patient Name: Tasha Ross Procedure Date: 12/14/2020 9:41 AM MRN: [id number] Endoscopist: Lorin Picket E. Tomasa Rand , MD Age: 45 Referring MD:  Date of Birth: Jun 17, 1975 Gender: Female Account #: [id number] Procedure:                Upper GI endoscopy Indications:              Epigastric abdominal pain, Nausea Medicines:                Monitored Anesthesia Care Procedure:                Pre-Anesthesia Assessment:                           - Prior to the procedure, a History and Physical                            was performed, and patient medications and                            allergies were reviewed. The patient's tolerance of                            previous anesthesia was also reviewed. The risks                            and benefits of the procedure and the sedation                            options and risks were discussed with the patient.                            All questions were answered, and informed consent                            was obtained. Prior Anticoagulants: The patient has                            taken no previous anticoagulant or antiplatelet                            agents. ASA Grade Assessment: II - A patient with                            mild systemic disease. After reviewing the risks                            and benefits, the patient was deemed in                            satisfactory condition to undergo the procedure.                           After obtaining informed consent, the endoscope was  passed under direct vision. Throughout the                            procedure, the patient's blood pressure, pulse, and                            oxygen saturations were monitored continuously. The                            GIF [id number] #[id number] was introduced through the                            mouth, and advanced to the third part of duodenum.                            The upper GI  endoscopy was accomplished without                            difficulty. The patient tolerated the procedure                            well. Scope In: Scope Out: Findings:                 The examined portions of the nasopharynx,                            oropharynx and larynx were normal.                           A single island of salmon-colored mucosa with an                            irregular Z line was present. No other visible                            abnormalities were present. Biopsies were taken                            with a cold forceps for histology. Estimated blood                            loss was minimal.                           The exam of the esophagus was otherwise normal.                           The gastroesophageal flap valve was visualized                            endoscopically and classified as Hill Grade IV (no                            fold, wide open lumen, hiatal hernia present).  A 2 cm hiatal hernia was found.                           The entire examined stomach was normal. Biopsies                            were taken with a cold forceps for Helicobacter                            pylori testing. Estimated blood loss was minimal.                           The examined duodenum was normal. Complications:            No immediate complications. Estimated Blood Loss:     Estimated blood loss was minimal. Impression:               - The examined portions of the nasopharynx,                            oropharynx and larynx were normal.                           - Salmon-colored mucosa suggestive of Barrett's                            esophagus. Biopsied.                           - Gastroesophageal flap valve classified as Hill                            Grade IV (no fold, wide open lumen, hiatal hernia                            present).                           - 2 cm hiatal hernia.                           -  Normal stomach. Biopsied.                           - Normal examined duodenum.                           - Patient's nausea may be related to reflux/hiatal                            hernia. Recommendation:           - Patient has a contact number available for                            emergencies. The signs and symptoms of potential  delayed complications were discussed with the                            patient. Return to normal activities tomorrow.                            Written discharge instructions were provided to the                            patient.                           - Resume previous diet.                           - Continue present medications.                           - Await pathology results.                           - Follow up as needed in GI clinic for further                            evaluation and management of chronic GI symptoms. Tyleigh Mahn E. Tomasa Rand, MD 12/14/2020 10:05:47 AM This report has been signed electronically.

## 2020-12-14 NOTE — Progress Notes (Signed)
Called to room to assist during endoscopic procedure.  Patient ID and intended procedure confirmed with present staff. Received instructions for my participation in the procedure from the performing physician.  

## 2020-12-14 NOTE — Progress Notes (Signed)
pt tolerated well. VSS. awake and to recovery. Report given to RN. Bite block left insitu to recovery. 

## 2020-12-14 NOTE — Patient Instructions (Addendum)
Handouts were given to your care partner on GERD & a Hiatal Hernia. [facility] a [facility]ill of [facility]. You may resume your other current medications today. Await biopsy results.  May take 1-3 weeks to receive pathology results. Please call if any questions or concerns.    YOU HAD AN ENDOSCOPIC PROCEDURE TODAY AT THE [facility]:   Refer to the procedure report that was given to you for any specific questions about what was found during the examination.  If the procedure report does not answer your questions, please call your gastroenterologist to clarify.  If you requested that your care partner not be given the details of your procedure findings, then the procedure report has been included in a sealed envelope for you to review at your convenience later.  YOU SHOULD EXPECT: Some feelings of bloating in the abdomen. Passage of more gas than usual.  Walking can help get rid of the air that was put into your GI tract during the procedure and reduce the bloating. If you had a lower endoscopy (such as a colonoscopy or flexible sigmoidoscopy) you may notice spotting of blood in your stool or on the toilet paper. If you underwent a bowel prep for your procedure, you may not have a normal bowel movement for a few days.  Please Note:  You might notice some irritation and congestion in your nose or some drainage.  This is from the oxygen used during your procedure.  There is no need for concern and it should clear up in a day or so.  SYMPTOMS TO REPORT IMMEDIATELY:   Following upper endoscopy (EGD)  Vomiting of blood or coffee ground material  New chest pain or pain under the shoulder blades  Painful or persistently difficult swallowing  New shortness of breath  Fever of 100F or higher  Black, tarry-looking stools  For urgent or emergent issues, a gastroenterologist can be reached at any hour by calling [phone]. Do not use MyChart messaging for urgent  concerns.    DIET:  We do recommend a small meal at first, but then you may proceed to your regular diet.  Drink plenty of fluids but you should avoid alcoholic beverages for 24 hours.  ACTIVITY:  You should plan to take it easy for the rest of today and you should NOT DRIVE or use heavy machinery until tomorrow (because of the sedation medicines used during the test).    FOLLOW UP: Our staff will call the number listed on your records 48-72 hours following your procedure to check on you and address any questions or concerns that you may have regarding the information given to you following your procedure. If we do not reach you, we will leave a message.  We will attempt to reach you two times.  During this call, we will ask if you have developed any symptoms of COVID 19. If you develop any symptoms (ie: fever, flu-like symptoms, shortness of breath, cough etc.) before then, please call [phone].  If you test positive for Covid 19 in the 2 weeks post procedure, please call and report this information to [place].    If any biopsies were taken you will be contacted by phone or by letter within the next 1-3 weeks.  Please call us at [phone] if you have not heard about the biopsies in 3 weeks.    SIGNATURES/CONFIDENTIALITY: You and/or your care partner have signed paperwork which will be entered into your electronic medical  record.  These signatures attest to the fact that that the information above on your After Visit Summary has been reviewed and is understood.  Full responsibility of the confidentiality of this discharge information lies with you and/or your care-partner.

## 2020-12-14 NOTE — Progress Notes (Signed)
VS by DT in adm  Pt's states no medical or surgical changes since previsit or office visit.

## 2020-12-14 NOTE — Progress Notes (Signed)
I have interviewed and examined the patient today and there have been no significant changes to her exam or her medical history since our clinical encounter.  Trevis Eden E. Nikira Kushnir, MD [facility]  

## 2020-12-18 ENCOUNTER — Telehealth: Payer: Self-pay

## 2020-12-18 NOTE — Telephone Encounter (Signed)
  Follow up Call-  Call back number 12/14/2020  Post procedure Call Back phone  # [phone]  Permission to leave phone message Yes  Some recent data might be hidden     Patient questions:  Do you have a fever, pain , or abdominal swelling? No. Pain Score  0 *  Have you tolerated food without any problems? Yes.    Have you been able to return to your normal activities? Yes.    Do you have any questions about your discharge instructions: Diet   No. Medications  No. Follow up visit  No.  Do you have questions or concerns about your Care? No.  Actions: * If pain score is 4 or above: No action needed, pain <4.

## 2020-12-24 ENCOUNTER — Other Ambulatory Visit: Payer: Self-pay

## 2020-12-24 ENCOUNTER — Ambulatory Visit (INDEPENDENT_AMBULATORY_CARE_PROVIDER_SITE_OTHER): Payer: BC Managed Care – PPO | Admitting: Neurology

## 2020-12-24 ENCOUNTER — Encounter: Payer: Self-pay | Admitting: Neurology

## 2020-12-24 VITALS — BP 111/79 | HR 74 | Ht 62.0 in | Wt 160.2 lb

## 2020-12-24 DIAGNOSIS — G43109 Migraine with aura, not intractable, without status migrainosus: Secondary | ICD-10-CM

## 2020-12-24 DIAGNOSIS — F5104 Psychophysiologic insomnia: Secondary | ICD-10-CM | POA: Diagnosis not present

## 2020-12-24 MED ORDER — VENLAFAXINE HCL ER 37.5 MG PO CP24: ORAL_CAPSULE | ORAL | 11 refills | Status: DC

## 2020-12-24 MED ORDER — TOPIRAMATE 100 MG PO TABS: 100.0000 mg | ORAL_TABLET | Freq: Every day | ORAL | 11 refills | Status: DC

## 2020-12-24 NOTE — Progress Notes (Signed)
Medication Samples have been provided to the patient.  Drug name: Bernita Raisin       Strength: 100mg         Qty: 4  LOT  Exp.Date: 04/2021  Dosing instructions: take one tablet as needed my repeat in 2 hours no more than 2 tabs in 24 hours  The patient has been instructed regarding the correct time, dose, and frequency of taking this medication, including desired effects and most common side effects.   Medication Samples have been provided to the patient.  Drug name: nurtec       Strength: 75mg         Qty: 2  LOT01/2023  Exp.Date: 2  Dosing instructions: take one tablet as needed, one tablet in 24 hours   The patient has been instructed regarding the correct time, dose, and frequency of taking this medication, including desired effects and most common side effects.

## 2020-12-24 NOTE — Progress Notes (Signed)
NEUROLOGY FOLLOW UP OFFICE NOTE  Tasha Ross [id number] 22-Sep-1975  HISTORY OF PRESENT ILLNESS: I had the pleasure of seeing Tasha Ross in follow-up in the neurology clinic on 12/24/2020.  The patient was last seen over a year ago for vertigo, headaches, and syncope. MRI brain unremarkable. She had an abnormal echo and holter monitor with heart rates up to 180-190 bpm, amitriptyline felt to be potential cause. 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 No further syncopal episodes since April 2018. On her last visit, she reported an increase in headaches and was advised to increase Effexor 37.5mg  to 2 tabs qhs. She presents today stating she has not had major headaches. She has headaches around once a month, the worst one was in June where she was down for 3-4 days. She does not take the Effexor every night, she only takes 1 tablet every other night. She has noticed a hard time getting completely off Effexor. She also adds that her PCP had started her on Topiramate 100mg  BID, however she had only been taking 1 tablet as needed. She felt it also helped with her headaches. She has not had any vertigo or any type of dizziness. She has noticed the floaters in her vision are starting to come back. She has not needed prn Tramadol in a long time. The Rizatriptan helped with her headaches but caused "nasty" side effects. She does not sleep well, usually with 5 hours of sleep or less. She does not drink caffeine. She works from to 10pm, and has difficulty settling down. She has had a sleep study in the past with no evidence of sleep apnea. She denies any focal numbness/tingling/weakness, no falls.   History on Initial Assessment 01/17/2015: This is a 45 yo RH woman presenting with vertigo, headaches, and episodes of passing out that started in 2009 or 2010. She had seen neurologist Dr. 2011 in 2012 while living in [place], reporting  that she is having the exact same symptoms she had, they had gotten under control while she was seeing him, with no episodes for a year, until January 2016 as she was transitioning for her move to [place] [facility] last March 2016. She reports migraines followed a couple of days later by vertigo. She would brace herself, trying to sit still, but would lose consciousness. She has fallen to the floor at work. Sometimes she can brace herself, other times there is no warning. Symptoms are brief, she would wake up on the floor. She was admitted to [facility] last 10/22/14 for near syncope, she reported right-sided weakness with slurred speech, that improved during transport. Glucose noted to be 72. She had an MRI brain without contrast with no acute intracranial changes. MRA brain reported a 1.43mm bulbous appearance of the distal left PICA branches in the region of the inferior cerebellar vermis. It may represent a vascular loop, however a small aneurysm is not entirely excluded. 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 Records from her previous neurologist were reviewed. He was concerned that dizziness and vertigo were not physiological in nature. He had done an MRI/MRA brain reported as normal as well. TSH, RPR, CBC normal. B12 low normal at 278, she had received replacement treatment. For migraine prophylaxis, she was started on amitriptyline in 2012, then switched to Topamax in 2013. She reports migraines are retro-orbital, sometimes dull or sharp pain, with phonophobia, nausea. She recalls taking amitriptyline and Topamax, and states  these were stopped because the episodes stopped. She recalls taking Imitrex, Fioricet, Maxalt which would help initially. She feels the amitriptyline and citalopram in the past helped put her in a calm mode. She has been having more frequent migraines occurring twice a week, lasting 3-4 hours. Ibuprofen does not help. The vertigo occurs a couple  of days after a migraine, with spinning sensation lasting up to a week. She usually gets less than  6 hours of sleep, feeling "real agitated." She reports the last syncopal episode was in June. When asked about stress, she does indicate that she has been working for [employer] since 2014, but had to move to a different location with her move in January.   She had a syncopal episode at work last 08/19/16 and has had a headache, neck pain, and seeing floaters since then. 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 No convulsive activity seen on video. She came to and recalls feeling sick to her stomach and unable to talk, with her words not coming out clear. She reports another syncopal episode in March on the way to work, she felt a pressure in her chest. She was recently treated with antibiotic for congestion, but has started feeling ill again. She wonders if weather changes are a trigger, she has become more hypersensitive to smells since Spring started.   Prior preventative medications: amitriptyline, topamax, Toprol, zonisamide, citalopram Prior rescue medications: imitrex, relpax, maxalt, zomig, fioricet  PAST MEDICAL HISTORY: Past Medical History:  Diagnosis Date   Blood transfusion without reported diagnosis    Cardiomyopathy (HCC)    a. Echo 01/2015: EF 45-50% with diffuse HK. // Echo 06/2018: EF 55, no RWMA, prominent apical trabeculation   History of stress test    a. ETT 11/16:  normal   Migraine    Near syncope    Snoring 04/06/2015   SVT (supraventricular tachycardia) (HCC)    a. Seen on event monitor 01/2015.   Vertigo     MEDICATIONS: Current Outpatient Medications on File Prior to Visit  Medication Sig Dispense Refill   azelastine (ASTELIN) 0.1 % nasal spray Place 2 sprays into both nostrils daily.  11   fluticasone (FLONASE) 50 MCG/ACT nasal spray Place 2 sprays into both nostrils  daily.     metoprolol succinate (TOPROL-XL) 25 MG 24 hr tablet TAKE 1 TABLET BY MOUTH EVERY DAY 30 tablet 11   naproxen (NAPROSYN) 500 MG tablet Take 500 mg by mouth as needed.     pantoprazole (PROTONIX) 20 MG tablet Take 1 tablet (20 mg total) by mouth daily. 90 tablet 3   tiZANidine (ZANAFLEX) 4 MG tablet Take 4 mg by mouth as needed.     venlafaxine XR (EFFEXOR-XR) 37.5 MG 24 hr capsule TAKE 2 CAPSULES AT NIGHT (Patient taking differently: TAKE 1 CAPSULES AT NIGHT) 60 capsule 11   zolpidem (AMBIEN) 5 MG tablet Take 5 mg by mouth at bedtime as needed.     rizatriptan (MAXALT) 10 MG tablet Take 1 tablet by mouth as needed. (Patient not taking: Reported on 12/24/2020)     No current facility-administered medications on file prior to visit.    ALLERGIES: Allergies  Allergen Reactions   Ibuprofen Other (See Comments) and Rash    Per Neurologist request Per Neurologist request   Amitriptyline Other (See Comments) and Rash    Causing headaches and head pains Causing headaches and head  pains   Clindamycin Rash   Elemental Sulfur Swelling   Sulfa Antibiotics Swelling and Rash   Sulfamethoxazole Swelling    FAMILY HISTORY: Family History  Problem Relation Age of Onset   Stomach cancer Father    Heart disease Maternal Aunt        Patient does not know details   Ovarian cancer Maternal Aunt    Hypertension Maternal Aunt    Lupus Paternal Aunt    Throat cancer Paternal Uncle    Leukemia Maternal Grandmother    Diabetes Maternal Grandmother    Glaucoma Paternal Grandmother    Dementia Paternal Grandmother    Hypertension Paternal Grandmother    Lupus Cousin    Hypertension Other    Diabetes Other    Stroke Neg Hx    Fainting Neg Hx    Migraines Neg Hx    Seizures Neg Hx     SOCIAL HISTORY: Social History   Socioeconomic History   Marital status: Single    Spouse name: Not on file   Number of children: 4   Years of education: Not on file   Highest education level: Not  on file  Occupational History   Occupation: Furniture conservator/restorer  Tobacco Use   Smoking status: Never   Smokeless tobacco: Never  Vaping Use   Vaping Use: Never used  Substance and Sexual Activity   Alcohol use: Yes    Alcohol/week: 0.0 standard drinks    Comment: Rare - once or twice a month.   Drug use: No   Sexual activity: Yes  Other Topics Concern   Not on file  Social History Narrative   Lives with 3 of her sons.  Has 4 sons.  Works for [employer].  Education: some college. Right handed    Social Determinants of Health   Financial Resource Strain: Not on file  Food Insecurity: Not on file  Transportation Needs: Not on file  Physical Activity: Not on file  Stress: Not on file  Social Connections: Not on file  Intimate Partner Violence: Not on file     PHYSICAL EXAM: Vitals:   12/24/20 1424  BP: 111/79  Pulse: 74  SpO2: 98%   General: No acute distress Head:  Normocephalic/atraumatic Skin/Extremities: No rash, no edema Neurological Exam: alert and awake. No aphasia or dysarthria. Fund of knowledge is appropriate.  Recent and remote memory are intact.  Attention and concentration are normal.   Cranial nerves: Pupils equal, round. Extraocular movements intact with no nystagmus. Visual fields full.  No facial asymmetry.  Motor: Bulk and tone normal, muscle strength 5/5 throughout with no pronator drift.   Finger to nose testing intact.  Gait narrow-based and steady, able to tandem walk adequately.  Romberg negative.   IMPRESSION: This is a pleasant 45 yo RH woman with a history of headaches, dizziness, and syncope since 2009 or 2010, symptoms had quieted down, then recurred at the beginning of 2016. On review of records, her previous neurologist was concerned dizziness and vertigo were not physiologic in nature. She did not tolerate amitriptyline for headache prophylaxis, in addition, Cardiology felt this was causing SVT on holter monitor. She is on Toprol for SVT with no  further syncopal episodes since April 2018. Headaches are episodic, she takes the Effexor 37.5mg  tablet and Topiramate 100mg  but not on a regular basis. Discussed that if headaches increase, would take Topiramate 100mg  qhs. She has prn Tramadol for severe headaches. She had side effects on several triptans and was given  samples for Nurtec and Bernita Raisin, she will let us know which is effective and refills will be sent. She continues to report insomnia and agrees to referral for Cognitive Behavioral Therapy. Follow-up in 1 year, she knows to call for any changes.    Thank you for allowing me to participate in her care.  Please do not hesitate to call for any questions or concerns.    Patrcia Dolly, M.D.   CC: [facility]

## 2020-12-24 NOTE — Patient Instructions (Addendum)
Continue Effexor 37.5mg  every night/every other night and Topiramate 100mg  daily  2. Referral will be sent to [facility] for [facility]. The physician is Dr. . Phone number is [phone], fax number is [phone]. His address is [address] [place], [place].   3. Try Nurtec or [id number] for headache rescue, let me know which is more helpful and we will send refills. Do not take more than 2-3 a week  4. Follow-up in 1 year, call for any changes

## 2021-01-02 ENCOUNTER — Encounter: Payer: Self-pay | Admitting: Gastroenterology

## 2021-01-06 ENCOUNTER — Other Ambulatory Visit: Payer: Self-pay | Admitting: Neurology

## 2021-02-19 DIAGNOSIS — M545 Low back pain, unspecified: Secondary | ICD-10-CM | POA: Insufficient documentation

## 2021-05-01 ENCOUNTER — Telehealth: Payer: Self-pay | Admitting: Podiatry

## 2021-05-01 NOTE — Telephone Encounter (Signed)
Pt would like to know if you could send in something for her toe. She states the nail has separated from her skin, it is now a brown color and things are getting stuck in it due to the separation. Would you like to see her as well? Please advise.

## 2021-05-02 NOTE — Telephone Encounter (Signed)
It probably has to be checked in office and debrided - can we get her an appt after the holiday - non urgent next available please

## 2021-06-04 ENCOUNTER — Ambulatory Visit (INDEPENDENT_AMBULATORY_CARE_PROVIDER_SITE_OTHER): Payer: BC Managed Care – PPO | Admitting: Podiatry

## 2021-06-04 ENCOUNTER — Other Ambulatory Visit: Payer: Self-pay

## 2021-06-04 DIAGNOSIS — B351 Tinea unguium: Secondary | ICD-10-CM

## 2021-06-06 ENCOUNTER — Telehealth: Payer: Self-pay | Admitting: Podiatry

## 2021-06-06 MED ORDER — FLUCONAZOLE 150 MG PO TABS: 150.0000 mg | ORAL_TABLET | ORAL | 2 refills | Status: DC

## 2021-06-06 NOTE — Telephone Encounter (Signed)
Patient states that a medication was to be called in for her nails, to prevent fungus from starting when the nail starts coming back in.    Please call into [facility]  on [address] [address]

## 2021-06-07 NOTE — Telephone Encounter (Signed)
Patient called back and said she needed the antifungal medication for her toenail to keep them from getting darker. She received a medication for yeast infection.  Please advise

## 2021-06-10 MED ORDER — CICLOPIROX 8 % EX SOLN: Freq: Every day | CUTANEOUS | 0 refills | Status: DC

## 2021-06-10 NOTE — Addendum Note (Signed)
Addended by: Ventura Sellers on: 06/10/2021 10:32 AM   Modules accepted: Orders

## 2021-06-11 NOTE — Progress Notes (Signed)
°  Subjective:  Patient ID: Tasha Ross, female    DOB: Oct 14, 1975,  MRN: [id number]  Chief Complaint  Patient presents with   Nail Problem    Recurring discoloration in nails- mainly the both great toes-    46 y.o. female presents with the above complaint. History confirmed with patient. Denies prior treatments.   Objective:  Physical Exam: warm, good capillary refill, nail exam  onychomycosis of the toenails, no trophic changes or ulcerative lesions, normal DP and PT pulses, and normal sensory exam.  Assessment:   1. Onychomycosis      Plan:  Patient was evaluated and treated and all questions answered.  Onychomycosis -Educated on etiology of nail fungus. -Discussed hygiene including sanitation of shoes. -eRx for fluconazole weekly.   No follow-ups on file.

## 2021-06-17 ENCOUNTER — Ambulatory Visit: Payer: BC Managed Care – PPO | Admitting: Neurology

## 2021-07-07 ENCOUNTER — Encounter (HOSPITAL_COMMUNITY): Payer: Self-pay

## 2021-07-07 ENCOUNTER — Emergency Department (HOSPITAL_COMMUNITY): Payer: BC Managed Care – PPO

## 2021-07-07 ENCOUNTER — Emergency Department (HOSPITAL_COMMUNITY)
Admission: EM | Admit: 2021-07-07 | Discharge: 2021-07-07 | Disposition: A | Discharge status: Home / Self Care | Payer: BC Managed Care – PPO | Attending: Emergency Medicine | Admitting: Emergency Medicine

## 2021-07-07 ENCOUNTER — Other Ambulatory Visit: Payer: Self-pay

## 2021-07-07 DIAGNOSIS — R55 Syncope and collapse: Secondary | ICD-10-CM | POA: Insufficient documentation

## 2021-07-07 DIAGNOSIS — R0789 Other chest pain: Secondary | ICD-10-CM | POA: Diagnosis present

## 2021-07-07 LAB — BASIC METABOLIC PANEL
Anion gap: 8 (ref 5–15)
BUN: 8 mg/dL (ref 6–20)
CO2: 25 mmol/L (ref 22–32)
Calcium: 8.7 mg/dL — ABNORMAL LOW (ref 8.9–10.3)
Chloride: 104 mmol/L (ref 98–111)
Creatinine, Ser: 0.6 mg/dL (ref 0.44–1.00)
GFR, Estimated: >60 mL/min (ref >60)
Glucose, Bld: 81 mg/dL (ref 70–99)
Potassium: 4 mmol/L (ref 3.5–5.1)
Sodium: 137 mmol/L (ref 135–145)

## 2021-07-07 LAB — CBC
HCT: 39.5 % (ref 36.0–46.0)
Hemoglobin: 12.7 g/dL (ref 12.0–15.0)
MCH: 28.5 pg (ref 26.0–34.0)
MCHC: 32.2 g/dL (ref 30.0–36.0)
MCV: 88.8 fL (ref 80.0–100.0)
Platelets: 338 10*3/uL (ref 150–400)
RBC: 4.45 MIL/uL (ref 3.87–5.11)
RDW: 12.8 % (ref 11.5–15.5)
WBC: 5.8 10*3/uL (ref 4.0–10.5)
nRBC: 0 % (ref 0.0–0.2)

## 2021-07-07 LAB — TROPONIN I (HIGH SENSITIVITY)
Troponin I (High Sensitivity): 3 ng/L (ref <18)
Troponin I (High Sensitivity): <2 ng/L (ref <18)

## 2021-07-07 LAB — I-STAT BETA HCG BLOOD, ED (MC, WL, AP ONLY): I-stat hCG, quantitative: <5 m[IU]/mL (ref <5)

## 2021-07-07 MED ORDER — ALUM & MAG HYDROXIDE-SIMETH 200-200-20 MG/5ML PO SUSP
30.0000 mL | Freq: Once | ORAL | Status: AC
  Administered 2021-07-07: 30 mL via ORAL
  Filled 2021-07-07: qty 30

## 2021-07-07 MED ORDER — ACETAMINOPHEN 500 MG PO TABS
1000.0000 mg | ORAL_TABLET | ORAL | Status: AC
  Administered 2021-07-07: 1000 mg via ORAL
  Filled 2021-07-07: qty 2

## 2021-07-07 MED ORDER — METOCLOPRAMIDE HCL 5 MG/ML IJ SOLN
10.0000 mg | Freq: Once | INTRAMUSCULAR | Status: AC
  Administered 2021-07-07: 10 mg via INTRAVENOUS
  Filled 2021-07-07: qty 2

## 2021-07-07 MED ORDER — DIPHENHYDRAMINE HCL 50 MG/ML IJ SOLN
12.5000 mg | Freq: Once | INTRAMUSCULAR | Status: AC
  Administered 2021-07-07: 12.5 mg via INTRAVENOUS
  Filled 2021-07-07: qty 1

## 2021-07-07 MED ORDER — IOHEXOL 350 MG/ML SOLN
75.0000 mL | Freq: Once | INTRAVENOUS | Status: AC | PRN
  Administered 2021-07-07: 75 mL via INTRAVENOUS

## 2021-07-07 MED ORDER — LIDOCAINE VISCOUS HCL 2 % MT SOLN
15.0000 mL | Freq: Once | OROMUCOSAL | Status: AC
  Administered 2021-07-07: 15 mL via ORAL
  Filled 2021-07-07: qty 15

## 2021-07-07 MED ORDER — NITROGLYCERIN 0.4 MG SL SUBL
0.4000 mg | SUBLINGUAL_TABLET | Freq: Once | SUBLINGUAL | Status: AC
  Administered 2021-07-07: 0.4 mg via SUBLINGUAL
  Filled 2021-07-07: qty 1

## 2021-07-07 MED ORDER — SODIUM CHLORIDE 0.9 % IV BOLUS
500.0000 mL | Freq: Once | INTRAVENOUS | Status: AC
  Administered 2021-07-07: 500 mL via INTRAVENOUS

## 2021-07-07 NOTE — ED Notes (Signed)
No headache or chest pain ?

## 2021-07-07 NOTE — ED Provider Notes (Signed)
[facility] EMERGENCY DEPARTMENT Provider Note   CSN: [id number] Arrival date & time: 07/07/21  1310     History  Chief Complaint  Patient presents with   Chest Pain    Tasha Ross is a 46 y.o. female with a past medical history of migraines presenting today with complaint of pressure in the middle of her chest.  She said that yesterday this began and she thought it was gas and drink a soda to try and burp.  This did not help her.  Today while she was at work she felt the pressure and passed out for a couple seconds.  Coworkers said she blinked very hard and then was back.  She denies pain or difficulty breathing.  Reports that she is seeing cardiologist in the past for atypical chest pain.  She reports being placed on metoprolol at that time however she does not know why.  Never had a blood clot, no recent surgery or travel, OCP products or leg swelling.  No history of ACS.      Home Medications Prior to Admission medications   Medication Sig Start Date End Date Taking? Authorizing Provider  azelastine (ASTELIN) 0.1 % nasal spray Place 2 sprays into both nostrils daily. 05/31/16   [provider]  ciclopirox (PENLAC) 8 % solution Apply topically at bedtime. Apply over nail and surrounding skin. Apply daily over previous coat. Remove weekly with file or polish remover. 06/10/21   Park Liter, DPM  fluconazole (DIFLUCAN) 150 MG tablet Take 1 tablet (150 mg total) by mouth once a week. 06/06/21   Park Liter, DPM  fluticasone (FLONASE) 50 MCG/ACT nasal spray Place 2 sprays into both nostrils daily.    [provider]  hydrocortisone 2.5 % ointment hydrocortisone 2.5 % topical ointment    [provider]  ketoconazole (NIZORAL) 2 % cream ketoconazole 2 % topical cream    [provider]  losartan (COZAAR) 25 MG tablet Take by mouth.    [provider]  metoprolol succinate (TOPROL-XL) 25 MG 24 hr tablet TAKE 1 TABLET  BY MOUTH EVERY DAY 06/17/19   Berton Bon, NP  naproxen (NAPROSYN) 500 MG tablet Take 500 mg by mouth as needed. 11/21/20   [provider]  nitrofurantoin, macrocrystal-monohydrate, (MACROBID) 100 MG capsule nitrofurantoin monohydrate/macrocrystals 100 mg capsule  TAKE ONE CAPSULE (100 MG DOSE) BY MOUTH 2 (TWO) TIMES DAILY FOR 5 DAYS.    [provider]  pantoprazole (PROTONIX) 20 MG tablet Take 1 tablet (20 mg total) by mouth daily. 12/14/20   Jenel Lucks, MD  rizatriptan (MAXALT) 10 MG tablet Take by mouth. 02/21/21   [provider]  sodium fluoride (FLUORISHIELD) 1.1 % GEL dental gel PreviDent 5000 Booster Plus 1.1 % dental paste  USE TWICE A DAY    [provider]  tiZANidine (ZANAFLEX) 4 MG tablet Take 4 mg by mouth as needed. 10/19/20   [provider]  tiZANidine (ZANAFLEX) 4 MG tablet Take 1 tablet by mouth 3 (three) times daily. 03/30/21   [provider]  topiramate (TOPAMAX) 100 MG tablet Take 1 tablet (100 mg total) by mouth daily. 12/24/20   Van Clines, MD  Tretinoin Microsphere (RETIN-A MICRO PUMP) 0.08 % GEL Retin-A Micro Pump 0.08 % topical gel    [provider]  venlafaxine XR (EFFEXOR-XR) 37.5 MG 24 hr capsule TAKE 1 CAPSULES AT NIGHT 12/24/20   Van Clines, MD  zolpidem (AMBIEN) 5 MG tablet  Take 5 mg by mouth at bedtime as needed. 07/18/19   [provider]      Allergies    Ibuprofen, Amitriptyline, Clindamycin, Elemental sulfur, Sulfa antibiotics, and Sulfamethoxazole    Review of Systems   Review of Systems  Cardiovascular:  Positive for chest pain.  See HPI  Physical Exam Updated Vital Signs BP 125/84 (BP Location: Right Arm)    Pulse 81    Temp (!) 97.5 F (36.4 C) (Oral)    Resp 18    SpO2 100%  Physical Exam Vitals and nursing note reviewed.  Constitutional:      General: She is not in acute distress.    Appearance: Normal appearance. She is not ill-appearing.  HENT:      Head: Normocephalic and atraumatic.  Eyes:     General: No scleral icterus.    Conjunctiva/sclera: Conjunctivae normal.  Cardiovascular:     Rate and Rhythm: Normal rate and regular rhythm. No extrasystoles are present.    Pulses:          Radial pulses are 2+ on the right side and 2+ on the left side.       Dorsalis pedis pulses are 2+ on the right side and 2+ on the left side.     Heart sounds: Normal heart sounds.  Pulmonary:     Effort: Pulmonary effort is normal. No respiratory distress.     Breath sounds: No decreased breath sounds or wheezing.  Chest:     Chest wall: No tenderness.  Musculoskeletal:     Right lower leg: No edema.     Left lower leg: No edema.  Skin:    General: Skin is warm and dry.     Findings: No rash.  Neurological:     Mental Status: She is alert.  Psychiatric:        Mood and Affect: Mood normal.    ED Results / Procedures / Treatments   Labs (all labs ordered are listed, but only abnormal results are displayed) Labs Reviewed  CBC  BASIC METABOLIC PANEL  I-STAT BETA HCG BLOOD, ED ([facility], [facility], [facility] ONLY)  TROPONIN I (HIGH SENSITIVITY)    EKG EKG Interpretation  Date/Time:  Sunday July 07 2021 13:23:05 EST Ventricular Rate:  88 PR Interval:  152 QRS Duration: 78 QT Interval:  352 QTC Calculation: 425 R Axis:   76 Text Interpretation: Normal sinus rhythm with sinus arrhythmia Normal ECG When compared with ECG of 01-Oct-2020 06:40, PREVIOUS ECG IS PRESENT Confirmed by Ernie Avena ([id number]) on 07/07/2021 5:10:38 PM  Radiology DG Chest 2 View  Result Date: 07/07/2021 CLINICAL DATA:  Chest pain and tightness. EXAM: CHEST - 2 VIEW COMPARISON:  10/01/2020 FINDINGS: The heart size and mediastinal contours are within normal limits. Both lungs are clear. The visualized skeletal structures are unremarkable. IMPRESSION: No active cardiopulmonary disease. Electronically Signed   By: Signa Kell M.D.   On: 07/07/2021 13:51    Procedures Procedures  NSR  normal rate  Medications Ordered in ED Medications  alum & mag hydroxide-simeth (MAALOX/MYLANTA) 200-200-20 MG/5ML suspension 30 mL (30 mLs Oral Given 07/07/21 1539)    And  lidocaine (XYLOCAINE) 2 % viscous mouth solution 15 mL (15 mLs Oral Given 07/07/21 1539)  acetaminophen (TYLENOL) tablet 1,000 mg (1,000 mg Oral Given 07/07/21 1530)              ED Course/ Medical Decision Making/ A&P  Medical Decision Making Amount and/or Complexity of Data Reviewed Labs: ordered. Radiology: ordered.  Risk OTC drugs. Prescription drug management.   Patient presents to the ED for concern of CP. The emergent differential diagnosis of chest pain includes: Acute coronary syndrome, pericarditis, aortic dissection, pulmonary embolism, tension pneumothorax, and esophageal rupture. All of these were considered through patient's eval.   Per internal/external chart review: Patient's visit with [facility] in 09/2020 with similar symptoms. This work-up was negative and she was diagnosed with GERD.  She reported that the GI cocktail made her feel better and she was discharged home. Additionally, this happened in 12/2019 with similar findings.   I performed a full physical exam, pertinent findings include: non-reproducible CP   Diagnostics:  I ordered and viewed labs. The pertinent results include: Negative troponin x1.   I ordered and individually viewed patient's chest x-ray and noted no abnormalities.. Radiologist read negative  Test considered: I considered ordering a D-dimer to search for pulmonary embolus however patient is PERC negative.  No risk factors for PE  Cardiac Monitoring:  The patient was maintained on a cardiac monitor.  I personally viewed and interpreted the cardiac monitored which showed NSR, normal rate, no premature complexes.   Treatment:  I ordered Tylenol for patient's headache and GI cocktail for patient's chest pain.  She reports that this made  her feel slightly better, headache has gone away however she continues to have some chest pressure.   MDM/Disposition:   Second troponin pending at discharge.  Patient signed out to [facility] [place], who will follow up on the results and decide ultimate disposition.  I suspect patient will be discharged home if she continues to feel better and has a stable troponin.  She will likely need a cardiology follow-up due to her repeated visits for similar complaints.  Suspect a need for echo/stress test.     Final Clinical Impression(s) / ED Diagnoses  Patient signed out to oncoming PA-C Logan.  See his note for further work-up, troponin results and ultimate disposition   Saddie Benders, PA-C 07/09/21 0919    Ernie Avena, MD 07/09/21 1243

## 2021-07-07 NOTE — ED Triage Notes (Signed)
Patient complains of chest pain that started yesterday and then returned today while at work. Non-radiating. Reports that she had syncopal episode at work. Alert and oriented ?

## 2021-07-07 NOTE — ED Provider Notes (Signed)
Patient is a 45 year old female whose care was transferred to me at shift change from [place] PA-C.  Her HPI as below: ? ?Tasha Ross is a 46 y.o. female with a past medical history of migraines presenting today with complaint of pressure in the middle of her chest.  She said that yesterday this began and she thought it was gas and drink a soda to try and burp.  This did not help her.  Today while she was at work she felt the pressure and passed out for a couple seconds.  Coworkers said she blinked very hard and then was back.  She denies pain or difficulty breathing.  Reports that she is seeing cardiologist in the past for atypical chest pain.  She reports being placed on metoprolol at that time however she does not know why.  Never had a blood clot, no recent surgery or travel, OCP products or leg swelling.  No history of ACS. ?Physical Exam  ?BP 115/78   Pulse 73   Temp (!) 97.5 ?F (36.4 ?C) (Oral)   Resp 13   SpO2 100%  ? ?Physical Exam ?Vitals and nursing note reviewed.  ?Constitutional:   ?   General: She is not in acute distress. ?   Appearance: She is well-developed.  ?HENT:  ?   Head: Normocephalic and atraumatic.  ?   Right Ear: External ear normal.  ?   Left Ear: External ear normal.  ?Eyes:  ?   General: No scleral icterus.    ?   Right eye: No discharge.     ?   Left eye: No discharge.  ?   Conjunctiva/sclera: Conjunctivae normal.  ?Neck:  ?   Trachea: No tracheal deviation.  ?Cardiovascular:  ?   Rate and Rhythm: Normal rate.  ?Pulmonary:  ?   Effort: Pulmonary effort is normal. No respiratory distress.  ?   Breath sounds: No stridor.  ?Abdominal:  ?   General: There is no distension.  ?Musculoskeletal:     ?   General: No swelling or deformity.  ?   Cervical back: Neck supple.  ?Skin: ?   General: Skin is warm and dry.  ?   Findings: No rash.  ?Neurological:  ?   Mental Status: She is alert.  ?   Cranial Nerves: Cranial nerve deficit: no gross deficits.  ? ?Procedures  ?Procedures ? ?ED  Course / MDM  ?  ?Medical Decision Making ?Amount and/or Complexity of Data Reviewed ?Labs: ordered. ?Radiology: ordered. ? ?Risk ?OTC drugs. ?Prescription drug management. ? ?Patient is a 46 year old female whose care was transferred to me at shift change from [place] PA-C.  Please see her note for additional information.  In summary, patient presents to the emergency department due to chest pressure that started yesterday.  She noted a syncopal episode today while at work when experiencing her pressure.  Denies any pain or shortness of breath.  States that she has been evaluated by cardiology in the past for atypical chest pain.  Denied any history of blood clots, recent travel/surgery, OCP products, leg swelling. ? ?At the time of shift change patient had a reassuring work-up and was pending a second troponin.  This has since resulted.  Initial troponin of 3 with a repeat of less than 2.  ECG does not appear ischemic.  Chest x-ray is negative. ? ?On reassessment we discussed patient's history once again.  States that she is still experiencing 6/10 chest pressure.  No shortness of breath.  Does confirm her syncopal episode earlier today but states that a coworker caught her and she did not hit the floor or obtained any trauma.  She was given a GI cocktail but denies any relief.  Notes a history of migraines and states that she is currently having a mild headache.  Patient is not tachycardic but she does note being on metoprolol.  Given these findings, will obtain a CT scan of the head as well as a CTA of the chest.  We will give patient a migraine cocktail as well as a single dose of sublingual nitroglycerin.  We will continue to closely monitor. ? ?CT scan of the head has been obtained with findings as noted below: ? ?IMPRESSION:  ?No acute intracranial abnormality.  ? ?CTA of the chest has been obtained with findings as noted below: ? ? ?IMPRESSION:  ?1. No CT evidence of pulmonary embolism or acute  cardiopulmonary  ?disease.  ?2. Stable left adrenal gland enlargement which may represent  ?sequelae associated with a left adrenal adenoma.  ? ?Patient reassessed and notes significant improvement since being given her migraine cocktail as well as a dose of nitroglycerin.  Still reports a mild headache but states that her chest pressure has completely resolved. ? ?Patient discussed with Dr. Lendell Caprice with cardiology.  We discussed patient's case as well as her lab work and imaging.  Unsure of the cause of her syncope.  Possibly vagal.  Given her very reassuring work-up does not feel that this is likely ischemic.  Also given that patient's blood pressure is low normal, feels that we could called significant hypotension discharging the patient on nitroglycerin.  Recommends discharge and outpatient follow-up with cardiology. ? ?Discussed everything above with the patient and she is agreeable.  Feel that she is stable for discharge at this time and she is agreeable.  She has a ride home.  Given very strict return precautions.  Recommended calling cardiology first thing tomorrow morning and she verbalized understanding.  Her questions were answered and she was amicable at the time of discharge. ? ? ? ?  ?Placido Sou, PA-C ?07/07/21 2107 ? ?  ?Ernie Avena, MD ?07/07/21 2236 ? ?

## 2021-07-07 NOTE — Discharge Instructions (Signed)
Please call your cardiologist first thing tomorrow morning.  If you develop any new or worsening symptoms, please come back to the emergency department immediately for reevaluation. ?

## 2021-07-09 NOTE — Progress Notes (Signed)
? ?Cardiology Office Note ? ? ?Date:  07/10/2021  ? ?ID:  Tasha Ross, DOB 1976-04-06, MRN [id number] ? ?PCP:  [facility]  ?Cardiologist:   Dietrich Pates, MD  ? ? ?Pt presents for f/u of CP and recent visit to ED ?  ?History of Present Illness: ?Tasha Ross is a 46 y.o. 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  LVEF on echo was 53% with evid of noncompaction.  MRI LVEF 53  Normal GXT in Nov 2017  Pt has seen in ED in past for CP  Atypical ? ?The pt was seen in clinic by Lonell Face in Feb 2021   ? ?This past  Friday the pt was at work, standing at [employer] when she developed the onset of severe substernal chest pain.  She said it felt almost like gas.  She tried a ginger ale.  This did not work, she could not burp.  Pain continued.  At one point she had transient syncope.  She ended up going to [facility] emergency room on Saturday..  Troponin was less that 2.  CT of the chest was done.  Negative for PE.   She was given 1 sublingual nitroglycerin and she said the pain went away immediately.   Sent home Since then she  has had some minimal discomfort. ? ?The patient denies problems swallowing food, food has not gotten stuck before.  Bowels are moving normally. ? ?Otherwise the patient has been feeling okay.  She remains active.  No chest pain with activity, no shortness of breath, edema.  No palpitations.  She has lost weight because she is watching what she is eating.  Conscious of this. ?Current Meds  ?Medication Sig  ? azelastine (ASTELIN) 0.1 % nasal spray Place 2 sprays into both nostrils daily.  ? fluticasone (FLONASE) 50 MCG/ACT nasal spray Place 2 sprays into both nostrils daily.  ? losartan (COZAAR) 25 MG tablet Take by mouth.  ? metoprolol succinate (TOPROL-XL) 25 MG 24 hr tablet TAKE 1 TABLET BY MOUTH EVERY DAY  ? naproxen (NAPROSYN) 500 MG tablet Take 500 mg by mouth as needed.  ? nitroGLYCERIN (NITROSTAT) 0.4 MG SL tablet Place 1 tablet  (0.4 mg total) under the tongue every 5 (five) minutes as needed for chest pain.  ? pantoprazole (PROTONIX) 20 MG tablet Take 1 tablet (20 mg total) by mouth daily.  ? rizatriptan (MAXALT) 10 MG tablet Take by mouth.  ? sodium fluoride (FLUORISHIELD) 1.1 % GEL dental gel PreviDent 5000 Booster Plus 1.1 % dental paste  USE TWICE A DAY  ? tiZANidine (ZANAFLEX) 4 MG tablet Take 1 tablet by mouth 3 (three) times daily.  ? topiramate (TOPAMAX) 100 MG tablet Take 1 tablet (100 mg total) by mouth daily.  ? venlafaxine XR (EFFEXOR-XR) 37.5 MG 24 hr capsule TAKE 1 CAPSULES AT NIGHT  ? [DISCONTINUED] ciclopirox (PENLAC) 8 % solution Apply topically at bedtime. Apply over nail and surrounding skin. Apply daily over previous coat. Remove weekly with file or polish remover.  ? [DISCONTINUED] fluconazole (DIFLUCAN) 150 MG tablet Take 1 tablet (150 mg total) by mouth once a week.  ? [DISCONTINUED] hydrocortisone 2.5 % ointment hydrocortisone 2.5 % topical ointment  ? [DISCONTINUED] ketoconazole (NIZORAL) 2 % cream ketoconazole 2 % topical cream  ? [DISCONTINUED] nitrofurantoin, macrocrystal-monohydrate, (MACROBID) 100 MG capsule nitrofurantoin monohydrate/macrocrystals 100 mg capsule ? TAKE ONE CAPSULE (100 MG DOSE) BY MOUTH 2 (TWO) TIMES DAILY FOR 5  DAYS.  ? [DISCONTINUED] tiZANidine (ZANAFLEX) 4 MG tablet Take 4 mg by mouth as needed.  ? [DISCONTINUED] Tretinoin Microsphere (RETIN-A MICRO PUMP) 0.08 % GEL Retin-A Micro Pump 0.08 % topical gel  ? [DISCONTINUED] zolpidem (AMBIEN) 5 MG tablet Take 5 mg by mouth at bedtime as needed.  ? ? ? ?Allergies:   Ibuprofen, Amitriptyline, Clindamycin, Elemental sulfur, Sulfa antibiotics, and Sulfamethoxazole  ? ?Past Medical History:  ?Diagnosis Date  ? Blood transfusion without reported diagnosis   ? Cardiomyopathy (HCC)   ? a. Echo 01/2015: EF 45-50% with diffuse HK. // Echo 06/2018: EF 55, no RWMA, prominent apical trabeculation  ? History of stress test   ? a. ETT 11/16:  normal  ?  Migraine   ? Near syncope   ? Snoring 04/06/2015  ? SVT (supraventricular tachycardia) (HCC)   ? a. Seen on event monitor 01/2015.  ? Vertigo   ? ? ?Past Surgical History:  ?Procedure Laterality Date  ? EPIGASTRIC HERNIA REPAIR    ? TONSILLECTOMY    ? TUBAL LIGATION Bilateral   ? VAGINAL HYSTERECTOMY    ? ? ? ?Social History:  The patient  reports that she has never smoked. She has never used smokeless tobacco. She reports current alcohol use. She reports that she does not use drugs.  ? ?Family History:  The patient's family history includes Dementia in her paternal grandmother; Diabetes in her maternal grandmother and another family member; Glaucoma in her paternal grandmother; Heart disease in her maternal aunt; Hypertension in her maternal aunt, paternal grandmother, and another family member; Leukemia in her maternal grandmother; Lupus in her cousin and paternal aunt; Ovarian cancer in her maternal aunt; Stomach cancer in her father; Throat cancer in her paternal uncle.  ? ? ?ROS:  Please see the history of present illness. All other systems are reviewed and  Negative to the above problem except as noted.  ? ? ?PHYSICAL EXAM: ?VS:  BP 110/72   Pulse 74   Ht 5\' 2"  (1.575 m)   Wt 142 lb (64.4 kg)   SpO2 98%   BMI 25.97 kg/m?   ?GEN: Well nourished, well developed, in no acute distress  ?HEENT: normal  ?Neck: JVP is normal, no carotid bruits ?Cardiac: RRR  No murmurs  rubs, or gallops,no lower extremity edema  ?Respiratory:  clear to auscultation bilaterally ?GI: soft, nontender, nondistended, + BS  No hepatomegaly  ?MS: no deformity Moving all extremities   ?Skin: warm and dry, no rash ?Neuro:  Strength and sensation are intact ?Psych: euthymic mood, full affect ? ? ?EKG:  EKG is not ordered today. ? ? ?Lipid Panel ?   ?Component Value Date/Time  ? CHOL 147 09/28/2015 1217  ? TRIG 48 09/28/2015 1217  ? HDL 61 09/28/2015 1217  ? CHOLHDL 2.4 09/28/2015 1217  ? VLDL 10 09/28/2015 1217  ? LDLCALC 76 09/28/2015  1217  ? ?  ? ?Wt Readings from Last 3 Encounters:  ?07/10/21 142 lb (64.4 kg)  ?12/24/20 160 lb 3.2 oz (72.7 kg)  ?12/14/20 159 lb (72.1 kg)  ?  ? ? ?ASSESSMENT AND PLAN: ? ?1  CP Chest pain episode this week and was intense.  It was relieved immediately by nitro after prolonged  Troponin was not elevate despite prolonged pain.  I reviewed the endoscopy report from 2022.  She has a 2 cm hiatal hernia.  Also had some changes in the esophagus at the time.  I will review with Dr. 2023.  I think  the pain may be from this or some esophageal motility problem. ? ?2  noncompaction cardiomyopathy  LVEF 55% on last echo  Doing well   Class I     Keep on current regimen  ? ?3   CT  Evid of possible adrenal adenoma CT that was just done is stable. ? ?Tentative f/u in 1 year  ? ?Current medicines are reviewed at length with the patient today.  The patient does not have concerns regarding medicines. ? ?Signed, ?Dietrich Pates, MD  ?07/10/2021 8:50 AM    ?[facility] ?[address] [place], [place], [place]  [address] ?Phone: [phone]; Fax: [phone]  ? ? ?

## 2021-07-10 ENCOUNTER — Ambulatory Visit (INDEPENDENT_AMBULATORY_CARE_PROVIDER_SITE_OTHER): Payer: BC Managed Care – PPO | Admitting: Internal Medicine

## 2021-07-10 ENCOUNTER — Encounter: Payer: Self-pay | Admitting: Internal Medicine

## 2021-07-10 ENCOUNTER — Other Ambulatory Visit: Payer: Self-pay

## 2021-07-10 VITALS — BP 110/72 | HR 74 | Ht 62.0 in | Wt 142.0 lb

## 2021-07-10 DIAGNOSIS — R079 Chest pain, unspecified: Secondary | ICD-10-CM | POA: Diagnosis not present

## 2021-07-10 MED ORDER — NITROGLYCERIN 0.4 MG SL SUBL: 0.4000 mg | SUBLINGUAL_TABLET | SUBLINGUAL | 3 refills | Status: DC | PRN

## 2021-07-10 NOTE — Patient Instructions (Addendum)
Medication Instructions:  ?Nitro sent to your pharmacy  ?*If you need a refill on your cardiac medications before your next appointment, please call your pharmacy* ? ? ?Lab Work: ?none ?If you have labs (blood work) drawn today and your tests are completely normal, you will receive your results only by: ?MyChart Message (if you have MyChart) OR ?A paper copy in the mail ?If you have any lab test that is abnormal or we need to change your treatment, we will call you to review the results. ? ? ?Testing/Procedures: ?none ? ? ?Follow-Up: ?At [facility], you and your health needs are our priority.  As part of our continuing mission to provide you with exceptional heart care, we have created designated Provider Care Teams.  These Care Teams include your primary Cardiologist (physician) and Advanced Practice Providers (APPs -  Physician Assistants and Nurse Practitioners) who all work together to provide you with the care you need, when you need it. ? ?We recommend signing up for the patient portal called "MyChart".  Sign up information is provided on this After Visit Summary.  MyChart is used to connect with patients for Virtual Visits (Telemedicine).  Patients are able to view lab/test results, encounter notes, upcoming appointments, etc.  Non-urgent messages can be sent to your provider as well.   ?To learn more about what you can do with MyChart, go to [email or web address].   ? ?Your next appointment:   ?1 year(s) ? ?The format for your next appointment:   ?In Person ? ?Provider:   ?Dietrich Pates, MD   ? ? ?Other Instructions ? ?  ?

## 2021-07-22 ENCOUNTER — Telehealth: Payer: Self-pay | Admitting: Internal Medicine

## 2021-07-22 ENCOUNTER — Other Ambulatory Visit: Payer: Self-pay

## 2021-07-22 ENCOUNTER — Ambulatory Visit (INDEPENDENT_AMBULATORY_CARE_PROVIDER_SITE_OTHER): Payer: BC Managed Care – PPO

## 2021-07-22 ENCOUNTER — Ambulatory Visit (INDEPENDENT_AMBULATORY_CARE_PROVIDER_SITE_OTHER): Payer: BC Managed Care – PPO | Admitting: Podiatry

## 2021-07-22 DIAGNOSIS — M2012 Hallux valgus (acquired), left foot: Secondary | ICD-10-CM

## 2021-07-22 DIAGNOSIS — M2011 Hallux valgus (acquired), right foot: Secondary | ICD-10-CM

## 2021-07-22 NOTE — Telephone Encounter (Signed)
Patient with prolonged atypical pain   I do not think it represented cardiac ischemia ?From a cardiac standpoint I think she is low risk for a cardiac complication   OK to proceed. ?

## 2021-07-22 NOTE — Telephone Encounter (Signed)
? ?  Pre-operative Risk Assessment  ?  ?Patient Name: Tasha Ross  ?DOB: [birth year] ?MRN: [id number]  ? ?  ? ?Request for Surgical Clearance   ? ?Procedure:   [place] Bunionectomy Right foot  ? ?Date of Surgery:  Clearance 12/05/21                              ?   ?Surgeon:  Dr. Logan Bores  ?Surgeon's Group or Practice Name:  [facility]  ?Phone number:  [phone] ?Fax number:  [phone] ?  ?Type of Clearance Requested:   ?- Medical  ?- Pharmacy:  Hold    TBD by Cardiology ?  ?Type of Anesthesia:   Anesthesiologist choice ?  ?Additional requests/questions:   ? ?Signed, ?Brooks Sailors   ?07/22/2021, 4:23 PM   ?

## 2021-07-22 NOTE — Progress Notes (Signed)
? ?  Subjective: 46 y.o. female presents today for evaluation of symptomatic bunions to the bilateral feet that have progressively increased in pain and tenderness over the past few years.  Patient states that now she is having pain almost on a daily basis.  She has tried different shoe gear modifications with no relief.  She states that bunions run in her family.  She presents for further treatment and evaluation  ? ? ?Past Medical History:  ?Diagnosis Date  ? Blood transfusion without reported diagnosis   ? Cardiomyopathy ([place])   ? a. Echo 01/2015: EF 45-50% with diffuse HK. // Echo 06/2018: EF 55, no RWMA, prominent apical trabeculation  ? History of stress test   ? a. ETT 11/16:  normal  ? Migraine   ? Near syncope   ? Snoring 04/06/2015  ? SVT (supraventricular tachycardia) ([place])   ? a. Seen on event monitor 01/2015.  ? Vertigo   ? ? ? ? ?Objective: ?Physical Exam ?General: The patient is alert and oriented x3 in no acute distress. ? ?Dermatology: Skin is cool, dry and supple bilateral lower extremities. Negative for open lesions or macerations. ? ?Vascular: Palpable pedal pulses bilaterally. No edema or erythema noted. Capillary refill within normal limits. ? ?Neurological: Epicritic and protective threshold grossly intact bilaterally.  ? ?Musculoskeletal Exam: Clinical evidence of bunion deformity noted to the respective foot. There is moderate pain on palpation range of motion of the first MPJ. Lateral deviation of the hallux noted consistent with hallux abductovalgus. ? ?Radiographic Exam: Increased intermetatarsal angle greater than 15? with a hallux abductus angle greater than 30? noted on AP view. Moderate degenerative changes noted within the first MPJ. ? 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 ? ?Plan of Care:  ?1. Patient was evaluated. X-Rays reviewed. ?2. Today we discussed the conservative versus surgical management of the presenting pathology. The patient opts for surgical  management. All possible complications and details of the procedure were explained. All patient questions were answered. No guarantees were expressed or implied. ?3. Authorization for surgery was initiated today. Surgery will consist of bunionectomy with osteotomy right ?4.  Medical clearance from PCP ?5.  A silicone bunion cushion was also provided for the patient to wear to see if this helps alleviate some symptoms prior to surgery ?6.  Return to clinic 1 week postop ? ?*Freight forwarder at [employer] ? ?Edrick Kins, DPM ?[place] ? ?Dr. Edrick Kins, DPM  ?  ?[address]                                        ?[place], [place] [id number]                ?Office [phone]  ?Fax [phone] ? ? ? ? ? ?

## 2021-07-22 NOTE — Telephone Encounter (Signed)
Dr. Tenny Craw ?You saw this patient on 07/10/21 for chest pain. I do not see ongoing workup ordered. We are asked for guidance to proceed with foot surgery.  ? ?Can you please provide recommendations for surgical clearance? ?

## 2021-07-23 ENCOUNTER — Ambulatory Visit: Payer: BC Managed Care – PPO | Admitting: Podiatry

## 2021-07-23 NOTE — Telephone Encounter (Signed)
? ?  Name: Tasha Ross  ?DOB: 1975-07-23  ?MRN: [id number]  ? ?Primary Cardiologist: Dietrich Pates, MD ? ?Chart reviewed as part of pre-operative protocol coverage.  ? ?She was recently seen by Dr. Tenny Craw with atypical chest pain. Given her symptoms, I reached out to Dr. Tenny Craw: ? ?Patient with prolonged atypical pain   I do not think it represented cardiac ischemia ?From a cardiac standpoint I think she is low risk for a cardiac complication. OK to proceed. ? ?Therefore, based on ACC/AHA guidelines, the patient would be at acceptable risk for the planned procedure without further cardiovascular testing.  ? ?The patient was advised that if she develops new symptoms prior to surgery to contact our office to arrange for a follow-up visit, and she verbalized understanding. ? ?I will route this recommendation to the requesting party via Epic fax function and remove from pre-op pool. Please call with questions. ? ?Marcelino Duster, PA ?07/23/2021, 6:33 AM ? ?

## 2021-10-01 ENCOUNTER — Emergency Department (HOSPITAL_COMMUNITY): Payer: BC Managed Care – PPO

## 2021-10-01 ENCOUNTER — Other Ambulatory Visit: Payer: Self-pay

## 2021-10-01 ENCOUNTER — Emergency Department (HOSPITAL_COMMUNITY)
Admission: EM | Admit: 2021-10-01 | Discharge: 2021-10-02 | Disposition: A | Discharge status: Home / Self Care | Payer: BC Managed Care – PPO | Attending: Emergency Medicine | Admitting: Emergency Medicine

## 2021-10-01 DIAGNOSIS — R200 Anesthesia of skin: Secondary | ICD-10-CM | POA: Diagnosis not present

## 2021-10-01 DIAGNOSIS — R202 Paresthesia of skin: Secondary | ICD-10-CM | POA: Diagnosis present

## 2021-10-01 LAB — BASIC METABOLIC PANEL
Anion gap: 5 (ref 5–15)
BUN: 11 mg/dL (ref 6–20)
CO2: 25 mmol/L (ref 22–32)
Calcium: 8.4 mg/dL — ABNORMAL LOW (ref 8.9–10.3)
Chloride: 106 mmol/L (ref 98–111)
Creatinine, Ser: 0.63 mg/dL (ref 0.44–1.00)
GFR, Estimated: >60 mL/min (ref >60)
Glucose, Bld: 88 mg/dL (ref 70–99)
Potassium: 4 mmol/L (ref 3.5–5.1)
Sodium: 136 mmol/L (ref 135–145)

## 2021-10-01 LAB — CBC
HCT: 35.3 % — ABNORMAL LOW (ref 36.0–46.0)
Hemoglobin: 11.1 g/dL — ABNORMAL LOW (ref 12.0–15.0)
MCH: 28 pg (ref 26.0–34.0)
MCHC: 31.4 g/dL (ref 30.0–36.0)
MCV: 88.9 fL (ref 80.0–100.0)
Platelets: 367 10*3/uL (ref 150–400)
RBC: 3.97 MIL/uL (ref 3.87–5.11)
RDW: 12.6 % (ref 11.5–15.5)
WBC: 6.7 10*3/uL (ref 4.0–10.5)
nRBC: 0 % (ref 0.0–0.2)

## 2021-10-01 NOTE — ED Triage Notes (Signed)
Pt here for numbness to R forearm. Pt had previous sprain to R wrist in April, and for the last "few weeks" she has had a tingling, pins and needles sensation in her R thumb, today she woke up and had that sensation now throughout her R forearm and hand. Pt denies any weakness, has no unilateral weakness or decreased grip strength.

## 2021-10-02 NOTE — ED Provider Notes (Signed)
[facility] Emergency Department Provider Note MRN:  [id number]  Arrival date & time: 10/02/21     Chief Complaint   Numbness   History of Present Illness   Tasha Ross is a 46 y.o. year-old female presents to the ED with chief complaint of right forearm numbness and tingling.  She states that she sprained her wrist a few weeks ago and has had some numbness and tingling sensation around the base of her right thumb.  She states that she became concerned today when the sensation spread up her right forearm.  She denies weakness.  She states that she wears a splint during the daytime but not at night.  She is seeing hand therapy.  She denies any new injuries.  History provided by patient.   Review of Systems  Pertinent review of systems noted in HPI.    Physical Exam   Vitals:   10/01/21 2022 10/02/21 0030  BP: (!) 129/94 126/83  Pulse: 71 69  Resp: 16 16  Temp: 98.5 F (36.9 C) 98.9 F (37.2 C)  SpO2: 100% 100%    CONSTITUTIONAL:  well-appearing, NAD NEURO:  Alert and oriented x 3, CN 3-12 grossly intact EYES:  eyes equal and reactive ENT/NECK:  Supple, no stridor  CARDIO:   appears well-perfused, intact distal pulses, brisk cap refill PULM:  No respiratory distress,  GI/GU:  non-distended,  MSK/SPINE:  No gross deformities, no edema, moves all extremities, normal grip strength, negative Tinel, negative Phalen SKIN:  no rash, atraumatic   *Additional and/or pertinent findings included in MDM below  Diagnostic and Interventional Summary    EKG Interpretation  Date/Time:    Ventricular Rate:    PR Interval:    QRS Duration:   QT Interval:    QTC Calculation:   R Axis:     Text Interpretation:         Labs Reviewed  CBC - Abnormal; Notable for the following components:      Result Value   Hemoglobin 11.1 (*)    HCT 35.3 (*)    All other components within normal limits  BASIC METABOLIC PANEL - Abnormal; Notable for the  following components:   Calcium 8.4 (*)    All other components within normal limits    No orders to display    Medications - No data to display   Procedures  /  Critical Care Procedures  ED Course and Medical Decision Making  I have reviewed the triage vital signs, the nursing notes, and pertinent available records from the EMR.  Social Determinants Affecting Complexity of Care: Patient has no clinically significant social determinants affecting this chief complaint..   ED Course:   Patient here with paresthesias to right forearm.  Top differential diagnoses include overuse, neuropraxia 2/2 recent sprain, carpal tunnel. Medical Decision Making Patient here with numbness and tingling sensation in her right forearm.  She sprained her wrist a few weeks ago and has had some paresthesias around her right thumb, but it worsened today and moved up into her forearm.  She does not have any weakness on my exam.  She has good cap refill and good distal pulses.  She is seeing hand therapy.  She wears a splint during the daytime.  I have encouraged her to wear this at night.  Discussed not overusing the hand.  Recommend hand surgery follow-up if not improving.  Problems Addressed: Paresthesia: acute illness or injury  Amount and/or Complexity of Data Reviewed Labs: ordered.  Details: no electrolyte derrangement  Risk OTC drugs.     Consultants: No consultations were needed in caring for this patient.   Treatment and Plan: Emergency department workup does not suggest an emergent condition requiring admission or immediate intervention beyond  what has been performed at this time. The patient is safe for discharge and has  been instructed to return immediately for worsening symptoms, change in  symptoms or any other concerns    Final Clinical Impressions(s) / ED Diagnoses     ICD-10-CM   1. Paresthesia  R20.2       ED Discharge Orders     None         Discharge  Instructions Discussed with and Provided to Patient:    Discharge Instructions      You symptoms are consistent with nerve injury.  Treatment is to continue doing therapy and I recommend that you wear your splint at night.  If you don't begin to improve, you should follow-up with the hand doctor listed.  You can take Tylenol or Motrin for discomfort.  If you begin to experience weakness, such as dropping things or not being able to extend your wrist, call your doctor or return to the ER.      Roxy Horseman, PA-C 10/02/21 0125    Maia Plan, MD 10/02/21 [phone]

## 2021-10-02 NOTE — ED Notes (Addendum)
Pt refused VS and declined discharge instructions

## 2021-10-02 NOTE — Discharge Instructions (Addendum)
You symptoms are consistent with nerve injury.  Treatment is to continue doing therapy and I recommend that you wear your splint at night.  If you don't begin to improve, you should follow-up with the hand doctor listed.  You can take Tylenol or Motrin for discomfort.  If you begin to experience weakness, such as dropping things or not being able to extend your wrist, call your doctor or return to the ER.

## 2021-10-14 DIAGNOSIS — M2012 Hallux valgus (acquired), left foot: Secondary | ICD-10-CM

## 2021-10-23 ENCOUNTER — Encounter: Payer: Self-pay | Admitting: Podiatry

## 2021-11-11 ENCOUNTER — Telehealth: Payer: Self-pay | Admitting: Urology

## 2021-11-11 NOTE — Telephone Encounter (Signed)
DOS - 12/05/21  [place] BUNIONECTOMY RIGHT --- [phone]  [employer] EFFECTIVE DATE - 05/05/21  PLAN DEDUCTIBLE - $1,500.00 W/ $0.00 REMAINING OUT OF POCKET - $4,000.00 W/ $1,230.00 REMAINING COINSURANCE - 20% COPAY - $0.00   SPOKE WITH SASHA WITH [employer] AND SHE STATED THAT FOR CPT CODE 96295 NO PRIOR AUTH IS REQUIRED.   REF # SASHA B. 11/11/21 AT 3:45 PM EST

## 2021-11-25 ENCOUNTER — Telehealth: Payer: Self-pay | Admitting: *Deleted

## 2021-11-25 NOTE — Telephone Encounter (Signed)
Patient is requesting an appointment to have her B/L toenails evaluated, are turning dark and appears to have moisture underneath. Please schedule

## 2021-11-27 ENCOUNTER — Ambulatory Visit (INDEPENDENT_AMBULATORY_CARE_PROVIDER_SITE_OTHER): Payer: BC Managed Care – PPO | Admitting: Podiatry

## 2021-11-27 DIAGNOSIS — B351 Tinea unguium: Secondary | ICD-10-CM

## 2021-11-27 MED ORDER — TERBINAFINE HCL 250 MG PO TABS: 250.0000 mg | ORAL_TABLET | Freq: Every day | ORAL | 0 refills | Status: DC

## 2021-11-27 NOTE — Progress Notes (Signed)
   Chief Complaint  Patient presents with   Foot Problem    Left foot great toe toe check for split toenail, patient had an injury that caused the split, patient feels split is getting worse.    HPI: 46 y.o. female presenting today for evaluation of bilateral great toe thickening with splitting.  Patient states that she did injure her right great toenail last year which caused splitting and thickening and discoloration of the nail plate.  She would like to have it evaluated.  She is scheduled for bunion surgery 12/05/2021.  Medical clearance was obtained last visit on 07/22/2021  Past Medical History:  Diagnosis Date   Blood transfusion without reported diagnosis    Cardiomyopathy [facility])    a. Echo 01/2015: EF 45-50% with diffuse HK. // Echo 06/2018: EF 55, no RWMA, prominent apical trabeculation   History of stress test    a. ETT 11/16:  normal   Migraine    Near syncope    Snoring 04/06/2015   SVT (supraventricular tachycardia) (HCC)    a. Seen on event monitor 01/2015.   Vertigo     Past Surgical History:  Procedure Laterality Date   EPIGASTRIC HERNIA REPAIR     TONSILLECTOMY     TUBAL LIGATION Bilateral    VAGINAL HYSTERECTOMY      Allergies  Allergen Reactions   Ibuprofen Other (See Comments) and Rash    Per Neurologist request Per Neurologist request   Amitriptyline Other (See Comments) and Rash    Causing headaches and head pains Causing headaches and head pains   Clindamycin Rash   Elemental Sulfur Swelling   Sulfa Antibiotics Swelling and Rash   Sulfamethoxazole Swelling     Physical Exam: General: The patient is alert and oriented x3 in no acute distress.  Dermatology: Hyperkeratotic dystrophic nail noted specifically to the right great toenail and to a lesser extent the left great toenail with splitting of the nail  Vascular: Palpable pedal pulses bilaterally. Capillary refill within normal limits.  Negative for any significant edema or erythema  Neurological:  Light touch and protective threshold grossly intact  Musculoskeletal Exam: No pedal deformities noted  Assessment: 1.  Nail dystrophy with likely fungal nail infection bilateral great toes   Plan of Care:  1. Patient evaluated.  2. Today we discussed different treatment options including oral, topical, and laser antifungal treatment modalities.  We discussed their efficacies and side effects.  Patient opts for oral antifungal treatment modality 3.  Prescription for Lamisil 250 mg #90 daily. Pt denies a history of liver pathology or symptoms.   4.  Recommend urea 40% topical nail gel available on [employer] daily to soften the nail 5.  Patient scheduled for right foot bunionectomy surgery 12/05/2021.  Authorization for surgery was obtained on 07/22/2021 6.  Return to clinic 1 week postop  *Manager at [employer]   Felecia Shelling, DPM [facility]  Dr. Felecia Shelling, DPM    [address] [place], [place] [address]                Office [phone]  Fax [phone]

## 2021-12-03 HISTORY — PX: FOOT SURGERY: SHX648

## 2021-12-05 ENCOUNTER — Other Ambulatory Visit: Payer: Self-pay | Admitting: Podiatry

## 2021-12-05 DIAGNOSIS — M2011 Hallux valgus (acquired), right foot: Secondary | ICD-10-CM | POA: Diagnosis not present

## 2021-12-05 MED ORDER — MELOXICAM 15 MG PO TABS: 15.0000 mg | ORAL_TABLET | Freq: Every day | ORAL | 1 refills | Status: DC

## 2021-12-05 MED ORDER — OXYCODONE-ACETAMINOPHEN 5-325 MG PO TABS: 1.0000 | ORAL_TABLET | ORAL | 0 refills | Status: DC | PRN

## 2021-12-05 NOTE — Progress Notes (Signed)
PRN postop 

## 2021-12-06 ENCOUNTER — Other Ambulatory Visit: Payer: Self-pay | Admitting: Podiatry

## 2021-12-06 MED ORDER — KETOROLAC TROMETHAMINE 10 MG PO TABS: 10.0000 mg | ORAL_TABLET | Freq: Four times a day (QID) | ORAL | 0 refills | Status: DC | PRN

## 2021-12-11 ENCOUNTER — Ambulatory Visit (INDEPENDENT_AMBULATORY_CARE_PROVIDER_SITE_OTHER): Payer: BC Managed Care – PPO

## 2021-12-11 ENCOUNTER — Ambulatory Visit (INDEPENDENT_AMBULATORY_CARE_PROVIDER_SITE_OTHER): Payer: BC Managed Care – PPO | Admitting: Podiatry

## 2021-12-11 DIAGNOSIS — Z9889 Other specified postprocedural states: Secondary | ICD-10-CM | POA: Diagnosis not present

## 2021-12-11 MED ORDER — OXYCODONE-ACETAMINOPHEN 5-325 MG PO TABS: 1.0000 | ORAL_TABLET | Freq: Four times a day (QID) | ORAL | 0 refills | Status: DC | PRN

## 2021-12-11 MED ORDER — DOXYCYCLINE HYCLATE 100 MG PO TABS: 100.0000 mg | ORAL_TABLET | Freq: Two times a day (BID) | ORAL | 0 refills | Status: DC

## 2021-12-11 NOTE — Progress Notes (Signed)
Chief Complaint  Patient presents with   Post-op Follow-up     #1 DOS 12/05/2021 BUNIONECTOMY W/OSTEOTOMY patient is requesting refill on pain medication    Subjective:  Patient presents today status post bunionectomy with first metatarsal osteotomy RT foot. DOS: 12/05/2021.  Patient states that she is doing well.  She does have pain and tenderness associated to the foot.  Managed by the Percocet 5/325 mg that was prescribed postoperatively.  She has been minimally weightbearing in the cam boot as instructed.  No new complaints at this time  Past Medical History:  Diagnosis Date   Blood transfusion without reported diagnosis    Cardiomyopathy [facility])    a. Echo 01/2015: EF 45-50% with diffuse HK. // Echo 06/2018: EF 55, no RWMA, prominent apical trabeculation   History of stress test    a. ETT 11/16:  normal   Migraine    Near syncope    Snoring 04/06/2015   SVT (supraventricular tachycardia) (HCC)    a. Seen on event monitor 01/2015.   Vertigo     Past Surgical History:  Procedure Laterality Date   EPIGASTRIC HERNIA REPAIR     TONSILLECTOMY     TUBAL LIGATION Bilateral    VAGINAL HYSTERECTOMY      Allergies  Allergen Reactions   Ibuprofen Other (See Comments) and Rash    Per Neurologist request Per Neurologist request   Amitriptyline Other (See Comments) and Rash    Causing headaches and head pains Causing headaches and head pains   Clindamycin Rash   Elemental Sulfur Swelling   Sulfa Antibiotics Swelling and Rash   Sulfamethoxazole Swelling    Objective/Physical Exam Neurovascular status intact.  Skin incisions appear to be well coapted with sutures intact.  There are some moderate edema localized to the surgical area.  There is some very mild erythema as well extending laterally to the lesser digits of the toes.  Concerning for possible low-grade cellulitis postoperatively  Radiographic Exam:  Orthopedic hardware and osteotomies sites appear to be stable with routine  healing.  There is a small metallic fragment overlying the orthopedic screw of the first metatarsal.  Overall there is good alignment of the first ray and reduction of the IM angle.   Assessment: 1. s/p bunionectomy with osteotomy right. DOS: 12/05/2021   Plan of Care:  1. Patient was evaluated. X-rays reviewed 2.  In regards to the x-rays we will just simply observe the small metallic fragment to the right foot to ensure that there is good healing and no complications associated small metallic fragment identified today. 3.  Continue compression and minimal weightbearing in the cam boot.  Patient is requesting a knee scooter.  Order placed for a knee scooter. 4.  Due to the mild erythema of the lesser digits of the foot I did call and a prescription for doxycycline 100 mg 2 times daily #20 5.  Refill prescription for Percocet 5/325 mg every 6 hours #30 7.  Return to clinic 1 week   Felecia Shelling, DPM [facility]  Dr. Felecia Shelling, DPM    [address] [place], [place] [address]                Office [phone]  Fax [phone]

## 2021-12-11 NOTE — Progress Notes (Signed)
dg 

## 2021-12-18 ENCOUNTER — Ambulatory Visit (INDEPENDENT_AMBULATORY_CARE_PROVIDER_SITE_OTHER): Payer: Self-pay | Admitting: Podiatry

## 2021-12-18 DIAGNOSIS — M79676 Pain in unspecified toe(s): Secondary | ICD-10-CM

## 2021-12-18 DIAGNOSIS — Z9889 Other specified postprocedural states: Secondary | ICD-10-CM

## 2021-12-18 NOTE — Progress Notes (Signed)
   Chief Complaint  Patient presents with   Routine Post Op    POV #2 DOS 12/05/2021 BUNIONECTOMY W/OSTEOTOMY RT- Sutures are intact. Patient has pain at times and patient continues to take pain medication as ordered. Using cam boot to ambulate.     Subjective:  Patient presents today status post bunionectomy with first metatarsal osteotomy RT foot. DOS: 12/05/2021.  Patient is doing well.  She says that over the past week the pain has subsided significantly.  She is minimally weightbearing in the cam boot.  No new complaints at this time  Past Medical History:  Diagnosis Date   Blood transfusion without reported diagnosis    Cardiomyopathy [facility])    a. Echo 01/2015: EF 45-50% with diffuse HK. // Echo 06/2018: EF 55, no RWMA, prominent apical trabeculation   History of stress test    a. ETT 11/16:  normal   Migraine    Near syncope    Snoring 04/06/2015   SVT (supraventricular tachycardia) (HCC)    a. Seen on event monitor 01/2015.   Vertigo     Past Surgical History:  Procedure Laterality Date   EPIGASTRIC HERNIA REPAIR     TONSILLECTOMY     TUBAL LIGATION Bilateral    VAGINAL HYSTERECTOMY      Allergies  Allergen Reactions   Ibuprofen Other (See Comments) and Rash    Per Neurologist request Per Neurologist request   Amitriptyline Other (See Comments) and Rash    Causing headaches and head pains Causing headaches and head pains   Clindamycin Rash   Elemental Sulfur Swelling   Sulfa Antibiotics Swelling and Rash   Sulfamethoxazole Swelling    Objective/Physical Exam Neurovascular status intact.  Skin incisions appear to be well coapted with sutures intact.  Erythema and edema concerning for low-grade cellulitis is resolved.  Minimal edema noted.  No erythema.  Radiographic Exam RT foot 12/11/2021:  Orthopedic hardware and osteotomies sites appear to be stable with routine healing.  There is a small metallic fragment overlying the orthopedic screw of the first metatarsal.   Overall there is good alignment of the first ray and reduction of the IM angle.   Assessment: 1. s/p bunionectomy with osteotomy right. DOS: 12/05/2021   Plan of Care:  1. Patient was evaluated.  Overall patient is feeling very well with significant improvement over the past week 2.  Sutures removed today 3.  Continue Ace wrap for compression daily 4.  Continue minimal weightbearing in the cam boot 5.  Return to clinic in 3 weeks for follow-up x-ray and to initiate physical therapy if needed   Felecia Shelling, DPM [facility]  Dr. Felecia Shelling, DPM    [address] [place], [place] [address]                Office [phone]  Fax [phone]

## 2021-12-25 ENCOUNTER — Ambulatory Visit (INDEPENDENT_AMBULATORY_CARE_PROVIDER_SITE_OTHER): Payer: BC Managed Care – PPO | Admitting: Neurology

## 2021-12-25 ENCOUNTER — Encounter: Payer: Self-pay | Admitting: Neurology

## 2021-12-25 VITALS — BP 129/88 | HR 77 | Ht 62.0 in | Wt 155.6 lb

## 2021-12-25 DIAGNOSIS — G43109 Migraine with aura, not intractable, without status migrainosus: Secondary | ICD-10-CM | POA: Diagnosis not present

## 2021-12-25 DIAGNOSIS — H43393 Other vitreous opacities, bilateral: Secondary | ICD-10-CM

## 2021-12-25 MED ORDER — TOPIRAMATE 100 MG PO TABS: 100.0000 mg | ORAL_TABLET | Freq: Every day | ORAL | 11 refills | Status: DC

## 2021-12-25 MED ORDER — VENLAFAXINE HCL ER 37.5 MG PO CP24
ORAL_CAPSULE | ORAL | 11 refills | Status: DC
Start: 2021-12-25 — End: 2022-01-23

## 2021-12-25 MED ORDER — UBRELVY 100 MG PO TABS: ORAL_TABLET | ORAL | 5 refills | Status: DC

## 2021-12-25 NOTE — Patient Instructions (Addendum)
Good to see you.  Increase venlafaxine 37.5mg : take 2 capsules every night  2. Continue Topiramate 100mg  daily  3. Take as needed for severe headaches, do not take more than 1 in a 24-hour period  4. Referral will be sent to Ophthalmology for the floaters  5. Follow-up in 6 months, call for any changes.

## 2021-12-25 NOTE — Progress Notes (Signed)
NEUROLOGY FOLLOW UP OFFICE NOTE  Tasha Ross [id number] 1976/02/03  HISTORY OF PRESENT ILLNESS: I had the pleasure of seeing Tasha Ross in follow-up in the neurology clinic on 12/25/2021.  The patient was last seen a year ago for vertigo, headaches, and syncope. Brain MRI unremarkable. She had an abnormal echo and holter monitor with heart rates up to 180-190 bpm, amitriptyline felt to be potential cause. 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 She continues to deny any further syncopal episodes since April 2018. She reports that the floaters are back. She had seen an optometrist in December with normal exam but was not having the floaters then. She denies any double vision or vision loss. She has also been having more headaches, occurring once a month but lasting for days at a time. She has not had a period since 2009 after hysterectomy. She still has her ovaries. She tried Ubrelvy in the past which did help. She had side effects on different triptans. She takes Effexor 37.5mg  qhs and Topiramate 100mg  daily without side effects. She continues to deal with sleep difficulties ("always been all over the place"). She takes naps and wakes up at 3am every morning. She recently had right foot surgery and wears a boot. No falls.    History on Initial Assessment 01/17/2015: This is a 46 yo RH woman presenting with vertigo, headaches, and episodes of passing out that started in 2009 or 2010. She had seen neurologist Dr. 2011 in 2012 while living in [place], reporting that she is having the exact same symptoms she had, they had gotten under control while she was seeing him, with no episodes for a year, until January 2016 as she was transitioning for her move to [facility] last March 2016. She reports migraines followed a couple of days later by vertigo. She would brace herself, trying to sit still, but would lose consciousness. She has  fallen to the floor at work. Sometimes she can brace herself, other times there is no warning. Symptoms are brief, she would wake up on the floor. She was admitted to [facility] last 45/19/16 for near syncope, she reported right-sided weakness with slurred speech, that improved during transport. Glucose noted to be 72. She had an MRI brain without contrast with no acute intracranial changes. MRA brain reported a 1.47mm bulbous appearance of the distal left PICA branches in the region of the inferior cerebellar vermis. It may represent a vascular loop, however a small aneurysm is not entirely excluded. 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 Records from her previous neurologist were reviewed. He was concerned that dizziness and vertigo were not physiological in nature. He had done an MRI/MRA brain reported as normal as well. TSH, RPR, CBC normal. B12 low normal at 278, she had received replacement treatment. For migraine prophylaxis, she was started on amitriptyline in 2012, then switched to Topamax in 2013. She reports migraines are retro-orbital, sometimes dull or sharp pain, with phonophobia, nausea. She recalls taking amitriptyline and Topamax, and states these were stopped because the episodes stopped. She recalls taking Imitrex, Fioricet, Maxalt which would help initially. She feels the amitriptyline and citalopram in the past helped put her in a calm mode. She has been having more frequent migraines occurring twice a week, lasting 3-4 hours. Ibuprofen does not help. The vertigo occurs a couple of days after a migraine, with spinning sensation lasting up to a week. She usually gets  less than  6 hours of sleep, feeling "real agitated." She reports the last syncopal episode was in June. When asked about stress, she does indicate that she has been working for [employer] since 2014, but had to move to a different location with her move in January.   She had a  syncopal episode at work last 08/19/16 and has had a headache, neck pain, and seeing floaters since then. 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 No convulsive activity seen on video. She came to and recalls feeling sick to her stomach and unable to talk, with her words not coming out clear. She reports another syncopal episode in March on the way to work, she felt a pressure in her chest. She was recently treated with antibiotic for congestion, but has started feeling ill again. She wonders if weather changes are a trigger, she has become more hypersensitive to smells since Spring started.   Prior preventative medications: amitriptyline, topamax, Toprol, zonisamide, citalopram Prior rescue medications: imitrex, relpax, maxalt, zomig, fioricet   PAST MEDICAL HISTORY: Past Medical History:  Diagnosis Date   Blood transfusion without reported diagnosis    Cardiomyopathy (HCC)    a. Echo 01/2015: EF 45-50% with diffuse HK. // Echo 06/2018: EF 55, no RWMA, prominent apical trabeculation   History of stress test    a. ETT 11/16:  normal   Migraine    Near syncope    Snoring 04/06/2015   SVT (supraventricular tachycardia) (HCC)    a. Seen on event monitor 01/2015.   Vertigo     MEDICATIONS: Current Outpatient Medications on File Prior to Visit  Medication Sig Dispense Refill   azelastine (ASTELIN) 0.1 % nasal spray Place 2 sprays into both nostrils daily.  11   fluticasone (FLONASE) 50 MCG/ACT nasal spray Place 2 sprays into both nostrils daily.     rizatriptan (MAXALT) 10 MG tablet Take by mouth.     sodium fluoride (FLUORISHIELD) 1.1 % GEL dental gel PreviDent 5000 Booster Plus 1.1 % dental paste  USE TWICE A DAY     terbinafine (LAMISIL) 250 MG tablet Take 1 tablet (250 mg total) by mouth daily. 90 tablet 0   tiZANidine (ZANAFLEX) 4 MG tablet Take 1 tablet by mouth 3 (three) times daily.      topiramate (TOPAMAX) 100 MG tablet Take 1 tablet (100 mg total) by mouth daily. 30 tablet 11   venlafaxine XR (EFFEXOR-XR) 37.5 MG 24 hr capsule TAKE 1 CAPSULES AT NIGHT 30 capsule 11   meloxicam (MOBIC) 15 MG tablet Take 1 tablet (15 mg total) by mouth daily. (Patient not taking: Reported on 12/25/2021) 30 tablet 1   metoprolol succinate (TOPROL-XL) 25 MG 24 hr tablet TAKE 1 TABLET BY MOUTH EVERY DAY 30 tablet 11   naproxen (NAPROSYN) 500 MG tablet Take 500 mg by mouth as needed. (Patient not taking: Reported on 11/27/2021)     nitroGLYCERIN (NITROSTAT) 0.4 MG SL tablet Place 1 tablet (0.4 mg total) under the tongue every 5 (five) minutes as needed for chest pain. 25 tablet 3   oxyCODONE-acetaminophen (PERCOCET) 5-325 MG tablet Take 1 tablet by mouth every 6 (six) hours as needed for severe pain. 30 tablet 0   pantoprazole (PROTONIX) 20 MG tablet Take 1 tablet (20 mg total) by mouth daily. 90 tablet 3   No current facility-administered medications on file prior to visit.  ALLERGIES: Allergies  Allergen Reactions   Ibuprofen Other (See Comments) and Rash    Per Neurologist request Per Neurologist request   Amitriptyline Other (See Comments) and Rash    Causing headaches and head pains Causing headaches and head pains   Clindamycin Rash   Elemental Sulfur Swelling   Sulfa Antibiotics Swelling and Rash   Sulfamethoxazole Swelling    FAMILY HISTORY: Family History  Problem Relation Age of Onset   Stomach cancer Father    Heart disease Maternal Aunt        Patient does not know details   Ovarian cancer Maternal Aunt    Hypertension Maternal Aunt    Lupus Paternal Aunt    Throat cancer Paternal Uncle    Leukemia Maternal Grandmother    Diabetes Maternal Grandmother    Glaucoma Paternal Grandmother    Dementia Paternal Grandmother    Hypertension Paternal Grandmother    Lupus Cousin    Hypertension Other    Diabetes Other    Stroke Neg Hx    Fainting Neg Hx    Migraines Neg  Hx    Seizures Neg Hx     SOCIAL HISTORY: Social History   Socioeconomic History   Marital status: Single    Spouse name: Not on file   Number of children: 4   Years of education: Not on file   Highest education level: Not on file  Occupational History   Occupation: Furniture conservator/restorer  Tobacco Use   Smoking status: Never   Smokeless tobacco: Never  Vaping Use   Vaping Use: Never used  Substance and Sexual Activity   Alcohol use: Yes    Alcohol/week: 0.0 standard drinks of alcohol    Comment: Rare - once or twice a month.   Drug use: No   Sexual activity: Yes  Other Topics Concern   Not on file  Social History Narrative   Lives with 3 of her sons.  Has 4 sons.  Works for [employer].  Education: some college. Right handed    Social Determinants of Health   Financial Resource Strain: Not on file  Food Insecurity: Not on file  Transportation Needs: Not on file  Physical Activity: Not on file  Stress: Not on file  Social Connections: Not on file  Intimate Partner Violence: Not on file     PHYSICAL EXAM: Vitals:   12/25/21 0834  BP: 129/88  Pulse: 77  SpO2: 98%   General: No acute distress Head:  Normocephalic/atraumatic Skin/Extremities: No rash, no edema. Boot on right foot Neurological Exam: alert and awake. No aphasia or dysarthria. Fund of knowledge is appropriate. Attention and concentration are normal.   Cranial nerves: Pupils equal, round. Extraocular movements intact with no nystagmus. Visual fields full.  No facial asymmetry.  Motor: Bulk and tone normal, muscle strength 5/5 throughout with no pronator drift.   Finger to nose testing intact.  Gait narrow-based, slow and cautious with boot on right foot. No ataxia   IMPRESSION: This is a pleasant 46 yo RH woman with a history of headaches, dizziness, and syncope since 2009 or 2010, symptoms had quieted down, then recurred at the beginning of 2016. On review of records, her previous neurologist was concerned  dizziness and vertigo were not physiologic in nature. She did not tolerate amitriptyline for headache prophylaxis, in addition, Cardiology felt this was causing SVT on holter monitor. She is on Toprol for SVT with no further syncopal episodes since April 2018. She reports headaches once a  month lasting for several days. She continues to report sleep difficulties. We discussed increasing Effexor 37.5mg : take 2 caps qhs. Continue Topiramate 100mg  daily. She has tried several triptans with various side effects and had good response to prn Ubrelvy, refills sent. She reports an increase in floaters and will be referred to Ophthalmology. Follow-up in 6 months, call for any changes.   Thank you for allowing me to participate in her care.  Please do not hesitate to call for any questions or concerns.    , M.D.   CC: [facility]

## 2021-12-26 ENCOUNTER — Telehealth (HOSPITAL_COMMUNITY): Payer: Self-pay | Admitting: Pharmacy Technician

## 2021-12-26 NOTE — Telephone Encounter (Signed)
Patient Advocate Encounter   Received notification that prior authorization for Ubrelvy 100MG  tablets is required.   PA submitted on 12/26/2021 Key [id number] Status is pending       12/28/2021, CPhT Pharmacy Patient Advocate Specialist [facility] Patient Advocate Team Direct Number: [phone]  Fax: [phone]

## 2021-12-26 NOTE — Telephone Encounter (Signed)
Patient Advocate Encounter  Prior Authorization for Bernita Raisin 100MG  tablets  has been approved.    PA# Effective dates: 0824/2023 through 03/28/2022      03/30/2022, CPhT Pharmacy Patient Advocate Specialist [facility] Patient Advocate Team Direct Number: [phone]  Fax: [phone]

## 2022-01-01 ENCOUNTER — Encounter: Payer: Self-pay | Admitting: Podiatry

## 2022-01-01 ENCOUNTER — Ambulatory Visit (INDEPENDENT_AMBULATORY_CARE_PROVIDER_SITE_OTHER): Payer: BC Managed Care – PPO | Admitting: Podiatry

## 2022-01-01 ENCOUNTER — Ambulatory Visit (INDEPENDENT_AMBULATORY_CARE_PROVIDER_SITE_OTHER): Payer: BC Managed Care – PPO

## 2022-01-01 DIAGNOSIS — Z9889 Other specified postprocedural states: Secondary | ICD-10-CM | POA: Diagnosis not present

## 2022-01-01 NOTE — Progress Notes (Signed)
   Chief Complaint  Patient presents with   Post-op Follow-up    POV #3 DOS 12/05/2021 BUNIONECTOMY W/OSTEOTOMY RT    Subjective:  Patient presents today status post bunionectomy with first metatarsal osteotomy RT foot. DOS: 12/05/2021.  Patient is doing well.  Patient continues to be weightbearing in the cam boot.  No new complaints at this time Past Medical History:  Diagnosis Date   Blood transfusion without reported diagnosis    Cardiomyopathy [facility])    a. Echo 01/2015: EF 45-50% with diffuse HK. // Echo 06/2018: EF 55, no RWMA, prominent apical trabeculation   History of stress test    a. ETT 11/16:  normal   Migraine    Near syncope    Snoring 04/06/2015   SVT (supraventricular tachycardia) (HCC)    a. Seen on event monitor 01/2015.   Vertigo     Past Surgical History:  Procedure Laterality Date   EPIGASTRIC HERNIA REPAIR     TONSILLECTOMY     TUBAL LIGATION Bilateral    VAGINAL HYSTERECTOMY      Allergies  Allergen Reactions   Ibuprofen Other (See Comments) and Rash    Per Neurologist request Per Neurologist request   Amitriptyline Other (See Comments) and Rash    Causing headaches and head pains Causing headaches and head pains   Clindamycin Rash   Elemental Sulfur Swelling   Sulfa Antibiotics Swelling and Rash   Sulfamethoxazole Swelling    Objective/Physical Exam Neurovascular status intact.  Skin incision healed.  Minimal edema noted.  No erythema.  Range of motion within normal limits to the great toe joint  Radiographic Exam RT foot 01/01/2022:  Note no Change since prior x-rays.  Orthopedic hardware and osteotomies sites appear to be stable with routine healing.  There is a small metallic fragment overlying the orthopedic screw of the first metatarsal.  Overall there is good alignment of the first ray and reduction of the IM angle.   Assessment: 1. s/p bunionectomy with osteotomy right. DOS: 12/05/2021   Plan of Care:  1. Patient was evaluated.   2.  Today  we are going to hold off on physical therapy.  The patient may begin to transition out of the cam boot into good supportive sneakers.  Recommended making this a slow transition over the next 4 weeks 3.  Return to clinic 4 weeks  Felecia Shelling, DPM [facility]  Dr. Felecia Shelling, DPM    [address] [place], [place] [address]                Office [phone]  Fax [phone]

## 2022-01-08 ENCOUNTER — Ambulatory Visit (INDEPENDENT_AMBULATORY_CARE_PROVIDER_SITE_OTHER): Payer: BC Managed Care – PPO | Admitting: Podiatry

## 2022-01-08 ENCOUNTER — Ambulatory Visit (INDEPENDENT_AMBULATORY_CARE_PROVIDER_SITE_OTHER): Payer: BC Managed Care – PPO

## 2022-01-08 DIAGNOSIS — Z9889 Other specified postprocedural states: Secondary | ICD-10-CM

## 2022-01-08 NOTE — Progress Notes (Signed)
   Chief Complaint  Patient presents with   Routine Post Op    POV # 4 DOS: 12/05/2021 right foot Bunionectomy, X-Rays done today     Subjective:  Patient presents today status post bunionectomy with first metatarsal osteotomy RT foot. DOS: 12/05/2021.  Patient states that earlier this week she stubbed her toe and injured her surgical toe.  She was very concerned because she heard audible crunching when the injury happened.  At that time she was not wearing her cam boot as instructed.  She presents today for further treatment and evaluation  Past Medical History:  Diagnosis Date   Blood transfusion without reported diagnosis    Cardiomyopathy (HCC)    a. Echo 01/2015: EF 45-50% with diffuse HK. // Echo 06/2018: EF 55, no RWMA, prominent apical trabeculation   History of stress test    a. ETT 11/16:  normal   Migraine    Near syncope    Snoring 04/06/2015   SVT (supraventricular tachycardia) (HCC)    a. Seen on event monitor 01/2015.   Vertigo     Past Surgical History:  Procedure Laterality Date   EPIGASTRIC HERNIA REPAIR     TONSILLECTOMY     TUBAL LIGATION Bilateral    VAGINAL HYSTERECTOMY      Allergies  Allergen Reactions   Ibuprofen Other (See Comments) and Rash    Per Neurologist request Per Neurologist request   Amitriptyline Other (See Comments) and Rash    Causing headaches and head pains Causing headaches and head pains   Clindamycin Rash   Elemental Sulfur Swelling   Sulfa Antibiotics Swelling and Rash   Sulfamethoxazole Swelling    Objective/Physical Exam No significant change since prior visit.  Neurovascular status intact.  Skin incision healed.  Minimal edema noted.  No erythema.  Range of motion within normal limits to the great toe joint.  There is some increased sensitivity and pain with palpation of the great toe joint and range of motion  Radiographic Exam RT foot 01/01/2022:  Fortunately no change compared to prior x-rays.  Orthopedic hardware and  osteotomies sites appear to be stable with routine healing.  There is a small metallic fragment overlying the orthopedic screw of the first metatarsal.  Overall there is good alignment of the first ray and reduction of the IM angle.   Assessment: 1. s/p bunionectomy with osteotomy right. DOS: 12/05/2021   Plan of Care:  1. Patient was evaluated.   2.  Continue weightbearing as tolerated in cam boot.  Patient may slowly transition out of the cam boot into good supportive sneakers in tennis shoes over the next 4 weeks 3.  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 Felecia Shelling, DPM    [address] [place], [place] [address]                Office [phone]  Fax [phone]

## 2022-01-11 ENCOUNTER — Ambulatory Visit (HOSPITAL_COMMUNITY)
Admission: EM | Admit: 2022-01-11 | Discharge: 2022-01-11 | Disposition: A | Discharge status: Home / Self Care | Payer: BC Managed Care – PPO | Attending: Physician Assistant | Admitting: Physician Assistant

## 2022-01-11 DIAGNOSIS — K130 Diseases of lips: Secondary | ICD-10-CM

## 2022-01-11 MED ORDER — HYDROCORTISONE 2.5 % EX OINT: TOPICAL_OINTMENT | Freq: Two times a day (BID) | CUTANEOUS | 0 refills | Status: DC

## 2022-01-11 MED ORDER — NYSTATIN 100000 UNIT/ML MT SUSP: 500000.0000 [IU] | Freq: Four times a day (QID) | OROMUCOSAL | 0 refills | Status: DC

## 2022-01-11 NOTE — ED Triage Notes (Signed)
Pt reports her lip are itching and chap x 1 week. She is using chap stick with no relief.

## 2022-01-11 NOTE — ED Provider Notes (Signed)
[facility]    CSN: [id number] Arrival date & time: 01/11/22  1258      History   Chief Complaint Chief Complaint  Patient presents with   Oral Swelling   chap lips    HPI Tasha Ross is a 46 y.o. female.   Patient presents today with a weeklong history of itching and irritation of her lips.  She does report that she is concerned she might of been exposed to yeast and wonders if this could be contributing to symptoms.  She has been using Vaseline Chapstick that initially provided some relief of symptoms but symptoms have recurred.  She reports a burning and itching sensation of her lips as well as some peeling.  She denies any shortness of breath, swelling of her throat, oral lesions, fever, nausea, vomiting.  Denies history of severe allergic reactions including anaphylaxis.  Denies any changes to personal hygiene products including soaps, detergents, toothpaste, lip products.  Denies any medication changes or antibiotic use.    Past Medical History:  Diagnosis Date   Blood transfusion without reported diagnosis    Cardiomyopathy (HCC)    a. Echo 01/2015: EF 45-50% with diffuse HK. // Echo 06/2018: EF 55, no RWMA, prominent apical trabeculation   History of stress test    a. ETT 11/16:  normal   Migraine    Near syncope    Snoring 04/06/2015   SVT (supraventricular tachycardia) (HCC)    a. Seen on event monitor 01/2015.   Vertigo     Patient Active Problem List   Diagnosis Date Noted   Facet arthritis of lumbar region 10/19/2020   Pain in joint of right ankle 09/06/2020   Epigastric hernia 04/18/2020   Abdominal wall mass 02/04/2020   Allergies 08/20/2018   Cardiomyopathy (HCC) 12/18/2015   Insomnia 04/19/2015   Snoring 04/06/2015   Head revolving around 02/06/2015   Complicated migraine 01/17/2015   Faintness 01/17/2015    Past Surgical History:  Procedure Laterality Date   EPIGASTRIC HERNIA REPAIR     TONSILLECTOMY     TUBAL LIGATION Bilateral     VAGINAL HYSTERECTOMY      OB History   No obstetric history on file.      Home Medications    Prior to Admission medications   Medication Sig Start Date End Date Taking? Authorizing Provider  hydrocortisone 2.5 % ointment Apply topically 2 (two) times daily. 01/11/22  Yes Lazarus Sudbury K, PA-C  nystatin (MYCOSTATIN) 100000 UNIT/ML suspension Take 5 mLs (500,000 Units total) by mouth 4 (four) times daily. 01/11/22  Yes Sharnise Blough K, PA-C  azelastine (ASTELIN) 0.1 % nasal spray Place 2 sprays into both nostrils daily. 05/31/16   [provider]  fluticasone (FLONASE) 50 MCG/ACT nasal spray Place 2 sprays into both nostrils daily.    [provider]  metoprolol succinate (TOPROL-XL) 25 MG 24 hr tablet TAKE 1 TABLET BY MOUTH EVERY DAY 06/17/19   Berton Bon, NP  naproxen (NAPROSYN) 500 MG tablet Take 500 mg by mouth as needed. 11/21/20   [provider]  nitroGLYCERIN (NITROSTAT) 0.4 MG SL tablet Place 1 tablet (0.4 mg total) under the tongue every 5 (five) minutes as needed for chest pain. 07/10/21   Pricilla Riffle, MD  oxyCODONE-acetaminophen (PERCOCET) 5-325 MG tablet Take 1 tablet by mouth every 6 (six) hours as needed for severe pain. 12/11/21   Felecia Shelling, DPM  pantoprazole (PROTONIX) 20 MG tablet Take 1 tablet (20 mg total) by  mouth daily. 12/14/20   Jenel Lucks, MD  sodium fluoride (FLUORISHIELD) 1.1 % GEL dental gel PreviDent 5000 Booster Plus 1.1 % dental paste  USE TWICE A DAY    [provider]  terbinafine (LAMISIL) 250 MG tablet Take 1 tablet (250 mg total) by mouth daily. 11/27/21   Felecia Shelling, DPM  tiZANidine (ZANAFLEX) 4 MG tablet Take 1 tablet by mouth 3 (three) times daily. 03/30/21   [provider]  topiramate (TOPAMAX) 100 MG tablet Take 1 tablet (100 mg total) by mouth daily. 12/25/21   Van Clines, MD  Ubrogepant (UBRELVY) 100 MG TABS Take 1 tablet at onset of migraine. May take second dose 24 hours after last  dose. Do not take more than 3 a week 12/25/21   Van Clines, MD  venlafaxine XR [facility]) 37.5 MG 24 hr capsule Take 2 capsules every night 12/25/21   Van Clines, MD    Family History Family History  Problem Relation Age of Onset   Stomach cancer Father    Heart disease Maternal Aunt        Patient does not know details   Ovarian cancer Maternal Aunt    Hypertension Maternal Aunt    Lupus Paternal Aunt    Throat cancer Paternal Uncle    Leukemia Maternal Grandmother    Diabetes Maternal Grandmother    Glaucoma Paternal Grandmother    Dementia Paternal Grandmother    Hypertension Paternal Grandmother    Lupus Cousin    Hypertension Other    Diabetes Other    Stroke Neg Hx    Fainting Neg Hx    Migraines Neg Hx    Seizures Neg Hx     Social History Social History   Tobacco Use   Smoking status: Never   Smokeless tobacco: Never  Vaping Use   Vaping Use: Never used  Substance Use Topics   Alcohol use: Yes    Alcohol/week: 0.0 standard drinks of alcohol    Comment: Rare - once or twice a month.   Drug use: No     Allergies   Ibuprofen, Amitriptyline, Clindamycin, Elemental sulfur, Sulfa antibiotics, and Sulfamethoxazole   Review of Systems Review of Systems  Constitutional:  Positive for activity change. Negative for appetite change, fatigue and fever.  HENT:  Negative for congestion, mouth sores, sneezing, sore throat, trouble swallowing and voice change.   Respiratory:  Negative for cough and shortness of breath.   Cardiovascular:  Negative for chest pain.  Gastrointestinal:  Negative for abdominal pain, diarrhea, nausea and vomiting.     Physical Exam Triage Vital Signs ED Triage Vitals [01/11/22 1442]  Enc Vitals Group     BP (!) 135/92     Pulse Rate 81     Resp 18     Temp 98.4 F (36.9 C)     Temp Source Oral     SpO2 98 %     Weight      Height      Head Circumference      Peak Flow      Pain Score      Pain Loc      Pain Edu?       Excl. in GC?    No data found.  Updated Vital Signs BP (!) 135/92 (BP Location: Left Arm)   Pulse 81   Temp 98.4 F (36.9 C) (Oral)   Resp 18   SpO2 98%   Visual Acuity Right Eye  Distance:   Left Eye Distance:   Bilateral Distance:    Right Eye Near:   Left Eye Near:    Bilateral Near:     Physical Exam Vitals reviewed.  Constitutional:      General: She is awake. She is not in acute distress.    Appearance: Normal appearance. She is well-developed. She is not ill-appearing.     Comments: Very pleasant female appears stated age in no acute distress sitting comfortably in exam room  HENT:     Head: Normocephalic and atraumatic.     Mouth/Throat:     Lips: Lesions present.     Tongue: No lesions.     Pharynx: Uvula midline. No oropharyngeal exudate or posterior oropharyngeal erythema.     Comments: Scaling and irritation noted of lips.  Normal-appearing oropharynx. Cardiovascular:     Rate and Rhythm: Normal rate and regular rhythm.     Heart sounds: Normal heart sounds, S1 normal and S2 normal. No murmur heard. Pulmonary:     Effort: Pulmonary effort is normal.     Breath sounds: Normal breath sounds. No wheezing, rhonchi or rales.     Comments: Clear to auscultation bilaterally Psychiatric:        Behavior: Behavior is cooperative.      UC Treatments / Results  Labs (all labs ordered are listed, but only abnormal results are displayed) Labs Reviewed - No data to display  EKG   Radiology DG Foot Complete Right  Result Date: 01/11/2022 Please see detailed radiograph report in office note.   Procedures Procedures (including critical care time)  Medications Ordered in UC Medications - No data to display  Initial Impression / Assessment and Plan / UC Course  I have reviewed the triage vital signs and the nursing notes.  Pertinent labs & imaging results that were available during my care of the patient were reviewed by me and considered in my medical  decision making (see chart for details).     Symptoms are consistent with allergic contact cheilitis.  Patient was instructed to avoid putting anything except for plain petroleum jelly on her lips.  We will use low-dose topical steroid (hydrocortisone 2.5% (twice daily for a week.  Discussed that she should not use this for more than a week as it can cause atrophy and thinning of the skin.  She is concerned that she was exposed to yeast and this is contributing to symptoms.  No obvious thrush on exam but patient does report she was previously experiencing some white plaques in her mouth.  She was prescribed nystatin to use in addition to hydrocortisone to manage symptoms.  Discussed that if she has any worsening symptoms including swelling of her lips, pain, shortness of breath, muffled voice, swelling of her throat she needs to go to the emergency room immediately to which she expressed understanding.  Final Clinical Impressions(s) / UC Diagnoses   Final diagnoses:  Allergic contact cheilitis     Discharge Instructions      Use hydrocortisone cream twice daily for 1 week.  Do not use it for more than 1 week as able because damage to the skin.  Use plain petroleum jelly such as Vaseline as a moisturizer.  Try to avoid anything that has since, colors, lanolin.  I did call in nystatin which treats for yeast.  You can use this if symptoms persist and you have white patches/irritation in your throat.  If symptoms are proving quickly please return for reevaluation.  If  anything worsens and you have swelling of your lips, peeling skin, fever, mouth sores, muffled voice, swelling of her throat, shortness of breath you need to be seen immediately.     ED Prescriptions     Medication Sig Dispense Auth. Provider   nystatin (MYCOSTATIN) 100000 UNIT/ML suspension Take 5 mLs (500,000 Units total) by mouth 4 (four) times daily. 60 mL Temeca Somma K, PA-C   hydrocortisone 2.5 % ointment Apply topically 2  (two) times daily. 30 g Josealfredo Adkins, Noberto Retort, PA-C      PDMP not reviewed this encounter.   Jeani Hawking, PA-C 01/11/22 1506

## 2022-01-11 NOTE — Discharge Instructions (Signed)
Use hydrocortisone cream twice daily for 1 week.  Do not use it for more than 1 week as able because damage to the skin.  Use plain petroleum jelly such as Vaseline as a moisturizer.  Try to avoid anything that has since, colors, lanolin.  I did call in nystatin which treats for yeast.  You can use this if symptoms persist and you have white patches/irritation in your throat.  If symptoms are proving quickly please return for reevaluation.  If anything worsens and you have swelling of your lips, peeling skin, fever, mouth sores, muffled voice, swelling of her throat, shortness of breath you need to be seen immediately.

## 2022-01-23 ENCOUNTER — Other Ambulatory Visit: Payer: Self-pay | Admitting: Neurology

## 2022-01-27 ENCOUNTER — Other Ambulatory Visit: Payer: Self-pay | Admitting: Podiatry

## 2022-02-03 ENCOUNTER — Encounter: Payer: Self-pay | Admitting: Podiatry

## 2022-02-03 ENCOUNTER — Ambulatory Visit (INDEPENDENT_AMBULATORY_CARE_PROVIDER_SITE_OTHER): Payer: BC Managed Care – PPO | Admitting: Podiatry

## 2022-02-03 DIAGNOSIS — Z9889 Other specified postprocedural states: Secondary | ICD-10-CM

## 2022-02-03 MED ORDER — TERBINAFINE HCL 250 MG PO TABS
250.0000 mg | ORAL_TABLET | Freq: Every day | ORAL | 0 refills | Status: DC
Start: 2022-02-03 — End: 2022-10-31

## 2022-02-03 NOTE — Progress Notes (Signed)
   Chief Complaint  Patient presents with   Post-op Follow-up    Patient is here for #4 post op DOS 12/05/2021 BUNIONECTOMY W/ OSTEOTOMY RT, patient states that the toes are stiff.patient states that she needs rx refil of Lamisil.   Subjective:  Patient presents today status post bunionectomy with first metatarsal osteotomy RT foot. DOS: 12/05/2021.  Patient states that she is doing well.  She wears the cam boot as needed.  Pain and tenderness has significantly improved and she no longer has any appreciable pain when ambulating.  No new complaints at this time  Past Medical History:  Diagnosis Date   Blood transfusion without reported diagnosis    Cardiomyopathy [facility])    a. Echo 01/2015: EF 45-50% with diffuse HK. // Echo 06/2018: EF 55, no RWMA, prominent apical trabeculation   History of stress test    a. ETT 11/16:  normal   Migraine    Near syncope    Snoring 04/06/2015   SVT (supraventricular tachycardia) ([place])    a. Seen on event monitor 01/2015.   Vertigo     Past Surgical History:  Procedure Laterality Date   EPIGASTRIC HERNIA REPAIR     TONSILLECTOMY     TUBAL LIGATION Bilateral    VAGINAL HYSTERECTOMY      Allergies  Allergen Reactions   Ibuprofen Other (See Comments) and Rash    Per Neurologist request Per Neurologist request   Amitriptyline Other (See Comments) and Rash    Causing headaches and head pains Causing headaches and head pains   Clindamycin Rash   Elemental Sulfur Swelling   Sulfa Antibiotics Swelling and Rash   Sulfamethoxazole Swelling    Objective/Physical Exam Neurovascular status intact.  No edema.  No pain on palpation throughout the toe joint.  There is some limited range of motion to the great toe joint likely secondary to scar tissue   Radiographic Exam RT foot 01/08/2022:  No change.  Orthopedic hardware and osteotomies sites appear to be stable with routine healing.  There is a small metallic fragment overlying the orthopedic screw of the  first metatarsal.  Overall there is good alignment of the first ray and reduction of the IM angle.   Assessment: 1. s/p bunionectomy with osteotomy right. DOS: 12/05/2021   Plan of Care:  1. Patient was evaluated.   2.  Discontinue cam boot.  Recommend good supportive tennis shoes and sneakers 3.  Recommend daily range of motion and stretching exercises to the great toe joint with physical massage 4.  Patient may now increase to full activity no restrictions. 5.  We will allow the patient 6 weeks roughly 2 resume full activity no restrictions.  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  Plan for return to work 02/14/2022 full activity no restrictions 6.  Return to clinic as needed  Recruitment consultant at [employer].  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 Edrick Kins, DPM    [address] [place], [place] [address]                Office [phone]  Fax [phone]

## 2022-02-18 DIAGNOSIS — J301 Allergic rhinitis due to pollen: Secondary | ICD-10-CM | POA: Insufficient documentation

## 2022-02-18 DIAGNOSIS — E663 Overweight: Secondary | ICD-10-CM | POA: Insufficient documentation

## 2022-02-27 ENCOUNTER — Telehealth: Payer: Self-pay

## 2022-02-27 NOTE — Telephone Encounter (Signed)
Received notification from insurance that current PA is expiring.  Submitted a Prior Authorization request to [facility] for  UBRELVY  via CoverMyMeds. Will update once we receive a response.   Key: [id number]

## 2022-02-28 ENCOUNTER — Other Ambulatory Visit (HOSPITAL_COMMUNITY): Payer: Self-pay

## 2022-02-28 NOTE — Telephone Encounter (Signed)
Received notification from [facility] regarding a prior authorization for  UBRELVY . Authorization has been APPROVED from 02/27/2022 to 02/28/2023. Approval letter sent to scan center.  Authorization #  [id number]   Test claim reveals that pt's copay is $0.00

## 2022-03-06 ENCOUNTER — Other Ambulatory Visit: Payer: Self-pay | Admitting: Podiatry

## 2022-04-09 ENCOUNTER — Emergency Department (HOSPITAL_COMMUNITY): Payer: BC Managed Care – PPO

## 2022-04-09 ENCOUNTER — Emergency Department (HOSPITAL_COMMUNITY)
Admission: EM | Admit: 2022-04-09 | Discharge: 2022-04-09 | Disposition: A | Discharge status: Home / Self Care | Payer: BC Managed Care – PPO | Attending: Emergency Medicine | Admitting: Emergency Medicine

## 2022-04-09 ENCOUNTER — Encounter (HOSPITAL_COMMUNITY): Payer: Self-pay

## 2022-04-09 ENCOUNTER — Other Ambulatory Visit: Payer: Self-pay

## 2022-04-09 DIAGNOSIS — S3992XA Unspecified injury of lower back, initial encounter: Secondary | ICD-10-CM | POA: Diagnosis present

## 2022-04-09 DIAGNOSIS — M542 Cervicalgia: Secondary | ICD-10-CM | POA: Diagnosis not present

## 2022-04-09 DIAGNOSIS — S39012A Strain of muscle, fascia and tendon of lower back, initial encounter: Secondary | ICD-10-CM | POA: Diagnosis not present

## 2022-04-09 DIAGNOSIS — Y92410 Unspecified street and highway as the place of occurrence of the external cause: Secondary | ICD-10-CM | POA: Insufficient documentation

## 2022-04-09 MED ORDER — CYCLOBENZAPRINE HCL 5 MG PO TABS: 5.0000 mg | ORAL_TABLET | Freq: Three times a day (TID) | ORAL | 0 refills | Status: DC | PRN

## 2022-04-09 MED ORDER — NAPROXEN 500 MG PO TABS: 500.0000 mg | ORAL_TABLET | ORAL | 0 refills | Status: DC | PRN
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
## 2022-04-09 NOTE — Discharge Instructions (Addendum)
Take Tylenol or naproxen for pain  You are expected to be stiff and sore tomorrow.  Take Flexeril for muscle spasms  Please rest tomorrow   See your doctor for follow-up  Return to ER if you have worse back pain, headache, vomiting
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
## 2022-04-09 NOTE — ED Notes (Signed)
Pt A&OX4 ambulatory at d/c with independent steady gait. Pt verbalized understanding of d/c instructions, prescriptions and follow up care. 

## 2022-07-16 ENCOUNTER — Encounter: Payer: Self-pay | Admitting: Neurology

## 2022-07-16 ENCOUNTER — Ambulatory Visit (INDEPENDENT_AMBULATORY_CARE_PROVIDER_SITE_OTHER): Payer: BC Managed Care – PPO | Admitting: Neurology

## 2022-07-16 VITALS — BP 111/76 | HR 67 | Ht 62.0 in | Wt 159.0 lb

## 2022-07-16 DIAGNOSIS — G43109 Migraine with aura, not intractable, without status migrainosus: Secondary | ICD-10-CM | POA: Diagnosis not present

## 2022-07-16 MED ORDER — TOPIRAMATE 100 MG PO TABS: ORAL_TABLET | ORAL | 3 refills | Status: DC

## 2022-07-16 MED ORDER — VENLAFAXINE HCL ER 75 MG PO CP24: ORAL_CAPSULE | ORAL | 3 refills | Status: DC

## 2022-07-16 NOTE — Patient Instructions (Signed)
Always good to see you.   Increase Topiramate '100mg'$ : take 1 and 1/2 tablets daily  2. Continue Effexor XR '75mg'$ : Take 1 tablet every night  3. If migraine lasts for 3 days, please call our office to get a migraine cocktail (will need a driver to bring you for the visit)  4. Follow-up in 6 months, call for any changes

## 2022-07-16 NOTE — Progress Notes (Signed)
NEUROLOGY FOLLOW UP OFFICE NOTE  Tasha Ross [id number]:[id number] [birth year]  HISTORY OF PRESENT ILLNESS: I had the pleasure of seeing Tasha Ross in follow-up in the neurology clinic on 07/16/2022.  The patient was last seen 7 months ago for vertigo, headaches, and syncope. Brain MRI unremarkable. She had an abnormal echo and holter monitor with heart rates up to 180-190 bpm, amitriptyline felt to be potential cause. 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 Since her last visit, she reports overall stable symptoms. She denies any episodes of loss of consciousness since 2018. She has migraines around twice a month, but sometimes they can last a whole week. Initially Roselyn Meier was helping but she stopped it because it has not helped recently. She took Tramadol but it has not helped as well. She mostly lies in a dark quiet room, she is very sensitive to noise. 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 She has affected her sleep, she may get 2-3 hours in the daytime. She denies any dizziness, vision changes, focal numbness/tingling/weakness, no falls.    History on Initial Assessment 01/17/2015: This is a 47 yo RH woman presenting with vertigo, headaches, and episodes of passing out that started in 2009 or 2010. She had seen neurologist Dr. Noland Ross in 2012 while living in [place], reporting that she is having the exact same symptoms she had, they had gotten under control while she was seeing him, with no episodes for a year, until January 2016 as she was transitioning for her move to [facility] last March 2016. She reports migraines followed a couple of days later by vertigo. She would brace herself, trying to sit still, but would lose consciousness. She has fallen to the floor at work. Sometimes she can brace herself, other times there is no warning. Symptoms are brief, she would wake up  on the floor. She was admitted to [facility] last 10/22/14 for near syncope, she reported right-sided weakness with slurred speech, that improved during transport. Glucose noted to be 72. She had an MRI brain without contrast with no acute intracranial changes. MRA brain reported a 1.45m bulbous appearance of the distal left PICA branches in the region of the inferior cerebellar vermis. It may represent a vascular loop, however a small aneurysm is not entirely excluded. 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 Records from her previous neurologist were reviewed. He was concerned that dizziness and vertigo were not physiological in nature. He had done an MRI/MRA brain reported as normal as well. TSH, RPR, CBC normal. B12 low normal at 278, she had received replacement treatment. For migraine prophylaxis, she was started on amitriptyline in 2012, then switched to Topamax in 2013. She reports migraines are retro-orbital, sometimes dull or sharp pain, with phonophobia, nausea. She recalls taking amitriptyline and Topamax, and states these were stopped because the episodes stopped. She recalls taking Imitrex, Fioricet, Maxalt which would help initially. She feels the amitriptyline and citalopram in the past helped put her in a calm mode. She has been having more frequent migraines occurring twice a week, lasting 3-4 hours. Ibuprofen does not help. The vertigo occurs a couple of days after a migraine, with spinning sensation lasting up to a week. She usually gets less than  6 hours of sleep, feeling "real agitated." She reports the last syncopal episode was in June. When asked about stress, she does indicate that  she has been working for [employer] since 2014, but had to move to a different location with her move in January.   She had a syncopal episode at work last 08/19/16 and has had a headache, neck pain, and seeing floaters since then. 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 No convulsive activity seen on video. She came to and recalls feeling sick to her stomach and unable to talk, with her words not coming out clear. She reports another syncopal episode in March on the way to work, she felt a pressure in her chest. She was recently treated with antibiotic for congestion, but has started feeling ill again. She wonders if weather changes are a trigger, she has become more hypersensitive to smells since Spring started.   Prior preventative medications: amitriptyline, topamax, Toprol, zonisamide, citalopram Prior rescue medications: imitrex, relpax, maxalt, zomig, fioricet   PAST MEDICAL HISTORY: Past Medical History:  Diagnosis Date   Blood transfusion without reported diagnosis    Cardiomyopathy ([place])    a. Echo 01/2015: EF 45-50% with diffuse HK. // Echo 06/2018: EF 55, no RWMA, prominent apical trabeculation   History of stress test    a. ETT 11/16:  normal   Migraine    Near syncope    Snoring 04/06/2015   SVT (supraventricular tachycardia)    a. Seen on event monitor 01/2015.   Vertigo     MEDICATIONS: Current Outpatient Medications on File Prior to Visit  Medication Sig Dispense Refill   azelastine (ASTELIN) 0.1 % nasal spray Place 2 sprays into both nostrils daily.  11   cyclobenzaprine (FLEXERIL) 5 MG tablet Take 1 tablet (5 mg total) by mouth 3 (three) times daily as needed. 15 tablet 0   fluticasone (FLONASE) 50 MCG/ACT nasal spray Place 2 sprays into both nostrils daily.     hydrocortisone 2.5 % ointment Apply topically 2 (two) times daily. 30 g 0   metoprolol succinate (TOPROL-XL) 25 MG 24 hr tablet TAKE 1 TABLET BY MOUTH EVERY DAY 30 tablet 11   naproxen (NAPROSYN) 500 MG tablet Take 1 tablet (500 mg total) by mouth as needed. 30 tablet 0   nitroGLYCERIN (NITROSTAT) 0.4 MG SL tablet Place 1 tablet (0.4 mg total) under the tongue every 5 (five)  minutes as needed for chest pain. 25 tablet 3   nystatin (MYCOSTATIN) 100000 UNIT/ML suspension Take 5 mLs (500,000 Units total) by mouth 4 (four) times daily. 60 mL 0   oxyCODONE-acetaminophen (PERCOCET) 5-325 MG tablet Take 1 tablet by mouth every 6 (six) hours as needed for severe pain. 30 tablet 0   pantoprazole (PROTONIX) 20 MG tablet Take 1 tablet (20 mg total) by mouth daily. 90 tablet 3   sodium fluoride (FLUORISHIELD) 1.1 % GEL dental gel PreviDent 5000 Booster Plus 1.1 % dental paste  USE TWICE A DAY     terbinafine (LAMISIL) 250 MG tablet Take 1 tablet (250 mg total) by mouth daily. 90 tablet 0   tiZANidine (ZANAFLEX) 4 MG tablet Take 1 tablet by mouth 3 (three) times daily.     topiramate (TOPAMAX) 100 MG tablet Take 1 tablet (100 mg total) by mouth daily. 30 tablet 11   Ubrogepant (UBRELVY) 100 MG TABS Take 1 tablet at onset of migraine. May take second dose 24 hours after last dose. Do not take more than 3 a week 10 tablet 5   venlafaxine XR (  EFFEXOR XR) 75 MG 24 hr capsule TAKE 1 CAPSULE AT BEDTIME 90 capsule 1   No current facility-administered medications on file prior to visit.    ALLERGIES: Allergies  Allergen Reactions   Ibuprofen Other (See Comments) and Rash    Per Neurologist request Per Neurologist request   Amitriptyline Other (See Comments) and Rash    Causing headaches and head pains Causing headaches and head pains   Clindamycin Rash   Elemental Sulfur Swelling   Sulfa Antibiotics Swelling and Rash   Sulfamethoxazole Swelling    FAMILY HISTORY: Family History  Problem Relation Age of Onset   Stomach cancer Father    Heart disease Maternal Aunt        Patient does not know details   Ovarian cancer Maternal Aunt    Hypertension Maternal Aunt    Lupus Paternal Aunt    Throat cancer Paternal Uncle    Leukemia Maternal Grandmother    Diabetes Maternal Grandmother    Glaucoma Paternal Grandmother    Dementia Paternal Grandmother    Hypertension  Paternal Grandmother    Lupus Cousin    Hypertension Other    Diabetes Other    Stroke Neg Hx    Fainting Neg Hx    Migraines Neg Hx    Seizures Neg Hx     SOCIAL HISTORY: Social History   Socioeconomic History   Marital status: Single    Spouse name: Not on file   Number of children: 4   Years of education: Not on file   Highest education level: Not on file  Occupational History   Occupation: Firefighter  Tobacco Use   Smoking status: Never   Smokeless tobacco: Never  Vaping Use   Vaping Use: Never used  Substance and Sexual Activity   Alcohol use: Yes    Alcohol/week: 0.0 standard drinks of alcohol    Comment: Rare - once or twice a month.   Drug use: No   Sexual activity: Yes  Other Topics Concern   Not on file  Social History Narrative   Lives with 3 of her sons.  Has 4 sons.  Works for [employer].  Education: some college. Right handed    Social Determinants of Health   Financial Resource Strain: Not on file  Food Insecurity: Not on file  Transportation Needs: Not on file  Physical Activity: Not on file  Stress: Not on file  Social Connections: Not on file  Intimate Partner Violence: Not on file     PHYSICAL EXAM: Vitals:   07/16/22 0831  BP: 111/76  Pulse: 67  SpO2: 97%   General: No acute distress Head:  Normocephalic/atraumatic Skin/Extremities: No rash, no edema Neurological Exam: alert and awake. No aphasia or dysarthria. Fund of knowledge is appropriate.  Attention and concentration are normal.   Cranial nerves: Pupils equal, round. Extraocular movements intact with no nystagmus. Visual fields full.  No facial asymmetry.  Motor: Bulk and tone normal, muscle strength 5/5 throughout with no pronator drift.   Finger to nose testing intact.  Gait narrow-based and steady, able to tandem walk adequately.  Romberg negative.   IMPRESSION: This is a pleasant 47 yo RH woman with a history of headaches, dizziness, and syncope since 2009 or 2010,  symptoms had quieted down, then recurred at the beginning of 2016. On review of records, her previous neurologist was concerned dizziness and vertigo were not physiologic in nature. She did not tolerate amitriptyline for headache prophylaxis, in addition, Cardiology felt  this was causing SVT on holter monitor. She is on Toprol for SVT with no further syncopal episodes since April 2018. She has migraines occurring around twice a month but lasting up to a week. Rescue medications have not been helpful, we discussed increasing Topiramate to '150mg'$  daily as preventative. She is also on Effexor XR '75mg'$  daily. We discussed sleep difficulties causing more headaches. She recently started a new job working third shift and will monitor sleep pattern, work on sleep hygiene. Follow-up in 6 months, call for any changes.    Thank you for allowing me to participate in her care.  Please do not hesitate to call for any questions or concerns.    Ellouise Newer, M.D.   CC: [place]

## 2022-10-22 ENCOUNTER — Encounter: Payer: Self-pay | Admitting: Podiatry

## 2022-10-31 ENCOUNTER — Encounter: Payer: Self-pay | Admitting: Internal Medicine

## 2022-10-31 ENCOUNTER — Ambulatory Visit: Payer: BC Managed Care – PPO | Attending: Internal Medicine | Admitting: Internal Medicine

## 2022-10-31 ENCOUNTER — Ambulatory Visit: Payer: BC Managed Care – PPO | Attending: Internal Medicine

## 2022-10-31 VITALS — BP 108/80 | HR 78 | Ht 62.0 in | Wt 162.0 lb

## 2022-10-31 DIAGNOSIS — I428 Other cardiomyopathies: Secondary | ICD-10-CM

## 2022-10-31 DIAGNOSIS — R079 Chest pain, unspecified: Secondary | ICD-10-CM | POA: Diagnosis not present

## 2022-10-31 DIAGNOSIS — Z131 Encounter for screening for diabetes mellitus: Secondary | ICD-10-CM | POA: Diagnosis not present

## 2022-10-31 DIAGNOSIS — Z79899 Other long term (current) drug therapy: Secondary | ICD-10-CM

## 2022-10-31 DIAGNOSIS — I429 Cardiomyopathy, unspecified: Secondary | ICD-10-CM

## 2022-10-31 MED ORDER — METOPROLOL SUCCINATE ER 25 MG PO TB24: 25.0000 mg | ORAL_TABLET | Freq: Every day | ORAL | 3 refills | Status: AC

## 2022-10-31 MED ORDER — NITROGLYCERIN 0.4 MG SL SUBL: 0.4000 mg | SUBLINGUAL_TABLET | SUBLINGUAL | 3 refills | Status: AC | PRN

## 2022-10-31 NOTE — Progress Notes (Unsigned)
Enrolled patient for a 3 day Zio XT monitor to be mailed to patients home  

## 2022-10-31 NOTE — Patient Instructions (Addendum)
Medication Instructions:  Your physician recommends that you continue on your current medications as directed. Please refer to the Current Medication list given to you today.  *If you need a refill on your cardiac medications before your next appointment, please call your pharmacy*   Lab Work: CBC, TSH, BMET, A1C, NMR---Today If you have labs (blood work) drawn today and your tests are completely normal, you will receive your results only by: MyChart Message (if you have MyChart) OR A paper copy in the mail If you have any lab test that is abnormal or we need to change your treatment, we will call you to review the results.   Testing/Procedures: Your physician has requested that you have an echocardiogram. Echocardiography is a painless test that uses sound waves to create images of your heart. It provides your doctor with information about the size and shape of your heart and how well your heart's chambers and valves are working. This procedure takes approximately one hour. There are no restrictions for this procedure. Please do NOT wear cologne, perfume, aftershave, or lotions (deodorant is allowed). Please arrive 15 minutes prior to your appointment time.   Your physician has requested that you wear a Zio heart monitor for 3 days. This will be mailed to your home with instructions on how to apply the monitor and how to return it when finished. Please allow 2 weeks after returning the heart monitor before our office calls you with the results.    Follow-Up: At [facility], you and your health needs are our priority.  As part of our continuing mission to provide you with exceptional heart care, we have created designated Provider Care Teams.  These Care Teams include your primary Cardiologist (physician) and Advanced Practice Providers (APPs -  Physician Assistants and Nurse Practitioners) who all work together to provide you with the care you need, when you need it.  We recommend  signing up for the patient portal called "MyChart".  Sign up information is provided on this After Visit Summary.  MyChart is used to connect with patients for Virtual Visits (Telemedicine).  Patients are able to view lab/test results, encounter notes, upcoming appointments, etc.  Non-urgent messages can be sent to your provider as well.   To learn more about what you can do with MyChart, go to [email or web address].    Your next appointment:   9 month(s)  Provider:   Dietrich Pates, MD     Christena Deem- Long Term Monitor Instructions     Your physician has requested you wear a ZIO patch monitor for 3 days.  This is a single patch monitor. [employer] supplies one patch monitor per enrollment. Additional  stickers are not available. Please do not apply patch if you will be having a Nuclear Stress Test,  Echocardiogram, Cardiac CT, MRI, or Chest Xray during the period you would be wearing the  monitor. The patch cannot be worn during these tests. You cannot remove and re-apply the  ZIO XT patch monitor.  Your ZIO patch monitor will be mailed 3 day USPS to your address on file. It may take 3-5 days  to receive your monitor after you have been enrolled.  Once you have received your monitor, please review the enclosed instructions. Your monitor  has already been registered assigning a specific monitor serial # to you.     Billing and Patient Assistance Program Information     We have supplied [employer] with any of your insurance information on file for billing  purposes.  [employer] offers a sliding scale Patient Assistance Program for patients that do not have  insurance, or whose insurance does not completely cover the cost of the ZIO monitor.  You must apply for the Patient Assistance Program to qualify for this discounted rate.  To apply, please call [employer] at [phone], select option 4, select option 2, ask to apply for  Patient Assistance Program. Meredeth Ide will ask your household income, and how  many people  are in your household. They will quote your out-of-pocket cost based on that information.  [employer] will also be able to set up a 37-month, interest-free payment plan if needed.     Applying the monitor     Shave hair from upper left chest.  Hold abrader disc by orange tab. Rub abrader in 40 strokes over the upper left chest as  indicated in your monitor instructions.  Clean area with 4 enclosed alcohol pads. Let dry.  Apply patch as indicated in monitor instructions. Patch will be placed under collarbone on left  side of chest with arrow pointing upward.  Rub patch adhesive wings for 2 minutes. Remove white label marked "1". Remove the white  label marked "2". Rub patch adhesive wings for 2 additional minutes.  While looking in a mirror, press and release button in center of patch. A small green light will  flash 3-4 times. This will be your only indicator that the monitor has been turned on.  Do not shower for the first 24 hours. You may shower after the first 24 hours.  Press the button if you feel a symptom. You will hear a small click. Record Date, Time and  Symptom in the Patient Logbook.  When you are ready to remove the patch, follow instructions on the last 2 pages of Patient  Logbook. Stick patch monitor onto the last page of Patient Logbook.  Place Patient Logbook in the blue and white box. Use locking tab on box and tape box closed  securely. The blue and white box has prepaid postage on it. Please place it in the mailbox as  soon as possible. Your physician should have your test results approximately 7 days after the  monitor has been mailed back to [facility].  Call [facility] Customer Care at [phone] if you have questions regarding  your ZIO XT patch monitor. Call them immediately if you see an orange light blinking on your  monitor.  If your monitor falls off in less than 4 days, contact our Monitor department at [phone].  If your monitor  becomes loose or falls off after 4 days call [employer] at [phone] for  suggestions on securing your monitor.

## 2022-10-31 NOTE — Progress Notes (Signed)
Cardiology Office Note   Date:  11/04/2022   ID:  Tasha Ross 1975-06-24, MRN [id number]  PCP:  [facility]  Cardiologist:   Dietrich Pates, MD    Pt presents for f/u of LV noncompaction    History of Present Illness: Tasha Ross is a 47 y.o. female with a history of LV noncompaction, dizziness, sinus tach.  Cardiac MRI in 2016 showed LVEF 55%  LVEF on echo was 53% with evid of noncompaction.  MRI LVEF 53  Normal GXT in Nov 2017  Pt has seen in ED in past for CP  Atypical  CPX in Aug 2017 showed mild  Functional impairment, primarily circulatory   Felt due to noncompactoin.    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   fluticasone (FLONASE) 50 MCG/ACT nasal spray Place 2 sprays into both nostrils daily.   pantoprazole (PROTONIX) 20 MG tablet Take 1 tablet (20 mg total) by mouth daily.   sodium fluoride (FLUORISHIELD) 1.1 % GEL dental gel PreviDent 5000 Booster Plus 1.1 % dental paste  USE TWICE A DAY   topiramate (TOPAMAX) 100 MG tablet Take 1 and 1/2 tablets daily   venlafaxine XR (EFFEXOR XR) 75 MG 24 hr capsule TAKE 1 CAPSULE AT BEDTIME   [DISCONTINUED] metoprolol succinate (TOPROL-XL) 25 MG 24 hr tablet TAKE 1 TABLET BY MOUTH EVERY DAY   [DISCONTINUED] nitroGLYCERIN (NITROSTAT) 0.4 MG SL tablet Place 1 tablet (0.4 mg total) under the tongue every 5 (five) minutes as needed for chest pain.     Allergies:   Ibuprofen, Amitriptyline, Clindamycin, Elemental sulfur, Sulfa  antibiotics, and Sulfamethoxazole   Past Medical History:  Diagnosis Date   Blood transfusion without reported diagnosis    Cardiomyopathy (HCC)    a. Echo 01/2015: EF 45-50% with diffuse HK. // Echo 06/2018: EF 55, no RWMA, prominent apical trabeculation   History of stress test    a. ETT 11/16:  normal   Migraine    Near syncope    Snoring 04/06/2015   SVT (supraventricular tachycardia)    a. Seen on event monitor 01/2015.   Vertigo     Past Surgical History:  Procedure Laterality Date   EPIGASTRIC HERNIA REPAIR     FOOT SURGERY Right 12/2021   TONSILLECTOMY     TUBAL LIGATION Bilateral    VAGINAL HYSTERECTOMY       Social History:  The patient  reports that she has never smoked. She has never used smokeless tobacco. She reports that she does not currently use alcohol. She reports that she does not use drugs.   Family History:  The patient's family history includes Dementia in her paternal grandmother; Diabetes in her maternal grandmother and another family member; Glaucoma in her paternal grandmother; Heart disease in her maternal aunt; Hypertension in her maternal  aunt, paternal grandmother, and another family member; Leukemia in her maternal grandmother; Lupus in her cousin and paternal aunt; Ovarian cancer in her maternal aunt; Stomach cancer in her father; Throat cancer in her paternal uncle.    ROS:  Please see the history of present illness. All other systems are reviewed and  Negative to the above problem except as noted.    PHYSICAL EXAM: VS:  BP 108/80   Pulse 78   Ht 5\' 2"  (1.575 m)   Wt 162 lb (73.5 kg)   SpO2 98%   BMI 29.63 kg/m   GEN: Well nourished, well developed, in no acute distress  HEENT: normal  Neck: JVP is normal, no carotid bruits Cardiac: RRR  No murmur  NO LE  edema  Respiratory:  clear to auscultation  GI: soft, nontender  No hepatomegaly  MS: no deformity Moving all extremities   Skin: warm and dry, no rash Neuro:  Strength and sensation  are intact Psych: euthymic mood, full affect   EKG:  EKG is not ordered today.  Echo  2016  1. The left ventricle has a visually estimated ejection fraction of of  55%. The cavity size was normal. Left ventricular diastolic parameters  were normal No evidence of left ventricular regional wall motion  abnormalities. Diagnosis of noncompaction made   in the past by cardiac MRI. Somewhat prominent trabeculation towards the  apex but cannot make definitive diagnosis from this echo.   2. The right ventricle has normal systolic function. The cavity was  normal. There is no increase in right ventricular wall thickness.   3. The mitral valve is normal in structure. No evidence of mitral valve  stenosis. No regurgitation.   4. The tricuspid valve is normal in structure.   5. The aortic valve is tricuspid. No stenosis.   6. The pulmonic valve was normal in structure.   7. The aortic root and ascending aorta are normal in size and structure.   8. No evidence of left ventricular regional wall motion abnormalities.   9. Right atrial pressure is estimated at 3 mmHg.  10. No complete TR doppler jet so unable to estimate PA systolic pressure.   Cardiac MRI 2016  1. Mild left ventricular dilatation with normal wall thickness and normal systolic function (LVEF = 55%).   There are no regional wall motion abnormalities.   There are pronounced trabeculations in the apical and mid segments with compacted to non-compacted ratio > 2.3:1 when measured in long axis views.   LVEDD:  55 mm   LVESD:  36 mm   LVEDV:  131 ml   LVESV:  59 ml   SV:  73 ml   CO:  5.8 L/minute   2. Normal right ventricular size, thickness and systolic function (LVEF = 53%) with no regional wall motion abnormalities.   3.  Normal biatrial size.   4. Normal size of the aortic root, ascending aorta and pulmonary artery.   5.  Mild mitral and trivial tricuspid regurgitation.   6.  No late gadolinium enhancement in  the LV and RV myocardium.   7.  No pericardial effusion.   IMPRESSION: 1. Mild left ventricular dilatation with normal wall thickness and normal systolic function (LVEF = 55%) with no regional wall motion abnormalities.   There are pronounced trabeculations in the apical and mid segments with compacted to non-compacted ratio > 2.3:1 when measured in long axis views.   2. Normal right ventricular size, thickness and systolic function (  LVEF = 53%) with no regional wall motion abnormalities. 3.  Mild mitral and trivial tricuspid regurgitation. 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 A standard CHF therapy is recommended. Lipid Panel    Component Value Date/Time   CHOL 147 09/28/2015 1217   TRIG 48 09/28/2015 1217   HDL 61 09/28/2015 1217   CHOLHDL 2.4 09/28/2015 1217   VLDL 10 09/28/2015 1217   LDLCALC 76 09/28/2015 1217      Wt Readings from Last 3 Encounters:  10/31/22 162 lb (73.5 kg)  07/16/22 159 lb (72.1 kg)  04/09/22 160 lb (72.6 kg)      ASSESSMENT AND PLAN:  1  Noncompaction cardiomyopathy  Last echo LVEF 55% Volume status appears ok     Will repeat   Also set up for event monitor  2  Hx of CP   Pt denies   Does have a hx of hiatal hernia.  3  Dizziness  ONe spell  It sounds more orthostatic   Encouraged fluids   Will get monitor  4  Sleep   Pt's circadian cycle is off with her night work   I do not think this is good for long term  Check CBC, TSH, BMET, A1C, lipids  Current medicines are reviewed at length with the patient today.  The patient does not have concerns regarding medicines.  Signed, Dietrich Pates, MD  11/04/2022 10:29 PM    [facility] [address] [place], [place], [place]  [address] Phone: [phone]; Fax: [phone]

## 2022-11-01 LAB — TSH: TSH: 0.951 u[IU]/mL (ref 0.450–4.500)

## 2022-11-01 LAB — NMR, LIPOPROFILE
Cholesterol, Total: 164 mg/dL (ref 100–199)
HDL Particle Number: 37.3 umol/L (ref >=30.5)
HDL-C: 68 mg/dL (ref >39)
LDL Particle Number: 825 nmol/L (ref <1000)
LDL Size: 21.4 nm (ref >20.5)
LDL-C (NIH Calc): 82 mg/dL (ref 0–99)
LP-IR Score: 25 (ref <=45)
Small LDL Particle Number: 360 nmol/L (ref <=527)
Triglycerides: 76 mg/dL (ref 0–149)

## 2022-11-01 LAB — BASIC METABOLIC PANEL
BUN/Creatinine Ratio: 15 (ref 9–23)
BUN: 9 mg/dL (ref 6–24)
CO2: 22 mmol/L (ref 20–29)
Calcium: 8.6 mg/dL — ABNORMAL LOW (ref 8.7–10.2)
Chloride: 105 mmol/L (ref 96–106)
Creatinine, Ser: 0.62 mg/dL (ref 0.57–1.00)
Glucose: 77 mg/dL (ref 70–99)
Potassium: 4.5 mmol/L (ref 3.5–5.2)
Sodium: 140 mmol/L (ref 134–144)
eGFR: 112 mL/min/{1.73_m2} (ref >59)

## 2022-11-01 LAB — CBC
Hematocrit: 38 % (ref 34.0–46.6)
Hemoglobin: 12.3 g/dL (ref 11.1–15.9)
MCH: 28.3 pg (ref 26.6–33.0)
MCHC: 32.4 g/dL (ref 31.5–35.7)
MCV: 87 fL (ref 79–97)
Platelets: 330 10*3/uL (ref 150–450)
RBC: 4.35 x10E6/uL (ref 3.77–5.28)
RDW: 12.5 % (ref 11.7–15.4)
WBC: 5.9 10*3/uL (ref 3.4–10.8)

## 2022-11-01 LAB — HEMOGLOBIN A1C
Est. average glucose Bld gHb Est-mCnc: 123 mg/dL
Hgb A1c MFr Bld: 5.9 % — ABNORMAL HIGH (ref 4.8–5.6)

## 2022-11-05 DIAGNOSIS — I428 Other cardiomyopathies: Secondary | ICD-10-CM

## 2022-11-25 ENCOUNTER — Ambulatory Visit (HOSPITAL_COMMUNITY): Payer: BC Managed Care – PPO | Attending: Internal Medicine

## 2022-11-25 DIAGNOSIS — R079 Chest pain, unspecified: Secondary | ICD-10-CM | POA: Diagnosis present

## 2022-11-25 DIAGNOSIS — I429 Cardiomyopathy, unspecified: Secondary | ICD-10-CM

## 2022-11-25 DIAGNOSIS — I428 Other cardiomyopathies: Secondary | ICD-10-CM

## 2022-11-25 LAB — ECHOCARDIOGRAM COMPLETE
AR max vel: 2.3 cm2
AV Area VTI: 2.28 cm2
AV Area mean vel: 2.17 cm2
AV Mean grad: 3 mmHg
AV Peak grad: 5.8 mmHg
Ao pk vel: 1.2 m/s
Area-P 1/2: 4.55 cm2
S' Lateral: 3.1 cm

## 2022-12-01 ENCOUNTER — Telehealth: Payer: Self-pay | Admitting: Internal Medicine

## 2022-12-01 NOTE — Progress Notes (Signed)
Cardiology Office Note   Date:  12/03/2022   ID:  Tasha Ross [birth year], MRN [id number]  PCP:  [facility]  Cardiologist:   Dietrich Pates, MD    Pt presents for evaluation of CP     History of Present Illness: Tasha Ross is a 47 y.o. female with a history of LV noncompaction, dizziness, sinus tach.  Cardiac MRI in 2016 showed LVEF 55%  LVEF on echo was 53% with evid of noncompaction.  MRI LVEF 53%  Normal GXT in Nov 2017  Pt has seen in ED in past for CP  Atypical  CPX in Aug 2017 showed mild functional impairment, primarily circulatory   Felt due to noncompactoin.    Monitor in March 2020 showed no arrhythmia   Echo showed LVEF 55% I saw the pt in June 2024  She felt good at the time    Repeat echo LVEF normal    Monitor showed no arrhythmias    On November 28, 2022 she says she  felt a little nauseated    Next day woke up not feeling good   Went to work  Nauseated  Then developed severe CP   Dizzy   Couldn't get up    Pt drove to ER in [place].  Blood work done   Negative   Pt says she was not given any Rx   Went home with continued pain     AT home took NTG  Since this visit she says she has had continued pain.  Every day   On and off   Has taken a lot of NTG Says she has cut back on activities, stayed at home    Denies reflux   No injury   NO cough  Current Meds  Medication Sig   azelastine (ASTELIN) 0.1 % nasal spray Place 2 sprays into both nostrils daily.   fluticasone (FLONASE) 50 MCG/ACT nasal spray Place 2 sprays into both nostrils daily.   isosorbide mononitrate (IMDUR) 30 MG 24 hr tablet Take 1 tablet (30 mg total) by mouth daily.   metoprolol succinate (TOPROL-XL) 25 MG 24 hr tablet Take 1 tablet (25 mg total) by mouth daily.   metoprolol tartrate (LOPRESSOR) 100 MG tablet Take one tablet (100 mg) by mouth 2 hours prior to your cardiac CT.   nitroGLYCERIN (NITROSTAT) 0.4 MG SL tablet Place 1 tablet (0.4 mg total) under the  tongue every 5 (five) minutes as needed for chest pain.   omeprazole (PRILOSEC) 20 MG capsule Take 1 capsule (20 mg total) by mouth daily.   sodium fluoride (FLUORISHIELD) 1.1 % GEL dental gel PreviDent 5000 Booster Plus 1.1 % dental paste  USE TWICE A DAY   topiramate (TOPAMAX) 100 MG tablet Take 1 and 1/2 tablets daily   venlafaxine XR (EFFEXOR XR) 75 MG 24 hr capsule TAKE 1 CAPSULE AT BEDTIME     Allergies:   Ibuprofen, Amitriptyline, Clindamycin, Elemental sulfur, Sulfa antibiotics, and Sulfamethoxazole   Past Medical History:  Diagnosis Date   Blood transfusion without reported diagnosis    Cardiomyopathy (HCC)    a. Echo 01/2015: EF 45-50% with diffuse HK. // Echo 06/2018: EF 55, no RWMA, prominent apical trabeculation   History of stress test    a. ETT 11/16:  normal   Migraine    Near syncope    Snoring 04/06/2015   SVT (supraventricular tachycardia)    a. Seen on event monitor 01/2015.  Vertigo     Past Surgical History:  Procedure Laterality Date   EPIGASTRIC HERNIA REPAIR     FOOT SURGERY Right 12/2021   TONSILLECTOMY     TUBAL LIGATION Bilateral    VAGINAL HYSTERECTOMY       Social History:  The patient  reports that she has never smoked. She has never used smokeless tobacco. She reports that she does not currently use alcohol. She reports that she does not use drugs.   Family History:  The patient's family history includes Dementia in her paternal grandmother; Diabetes in her maternal grandmother and another family member; Glaucoma in her paternal grandmother; Heart disease in her maternal aunt; Hypertension in her maternal aunt, paternal grandmother, and another family member; Leukemia in her maternal grandmother; Lupus in her cousin and paternal aunt; Ovarian cancer in her maternal aunt; Stomach cancer in her father; Throat cancer in her paternal uncle.    ROS:  Please see the history of present illness. All other systems are reviewed and  Negative to the above  problem except as noted.    PHYSICAL EXAM: VS:  BP 122/78   Pulse 94   Ht 5\' 2"  (1.575 m)   Wt 155 lb 12.8 oz (70.7 kg)   SpO2 98%   BMI 28.50 kg/m   GEN: Overweight 47 yo  in no acute distress  HEENT: normal  Neck: JVP is normal, no carotid bruit Cardiac: RRR  No murmur  No LE  edema  Respiratory:  clear to auscultation  GI: soft, nontender  No hepatomegaly   EKG:  EKG shows NSR 94 bpm  Septal infarct    MOnitor 2024    Patient had a min HR of 50 bpm, max HR of 176 bpm, and avg HR of 93 bpm. Predominant underlying rhythm was Sinus Rhythm. 1 run of Supraventricular Tachycardia occurred lasting 7 beats with a max rate of 160 bpm ( avg 134 bpm) . Isolated SVEs were rare ( < 1. 0% ) , SVE Couplets were rare ( < 1. 0% ) , and no SVE Triplets were present. Isolated VEs were rare ( < 1. 0% ) , and no VE Couplets or VE Triplets were present.  Echo  July 2024 1. LV apex with mild trabeculation but not diagnostic by echo.. Left  ventricular ejection fraction, by estimation, is 50 to 55%. The left  ventricle has low normal function. The left ventricle has no regional wall  motion abnormalities. Left ventricular  diastolic parameters were normal.   2. Right ventricular systolic function is normal. The right ventricular  size is normal.   3. The mitral valve is normal in structure. Trivial mitral valve  regurgitation. No evidence of mitral stenosis.   4. The aortic valve is normal in structure. Aortic valve regurgitation is  not visualized. No aortic stenosis is present.   5. The inferior vena cava is normal in size with greater than 50%  respiratory variability, suggesting right atrial pressure of 3 mmHg.    Cardiac MRI 2016  1. Mild left ventricular dilatation with normal wall thickness and normal systolic function (LVEF = 55%).   There are no regional wall motion abnormalities.   There are pronounced trabeculations in the apical and mid segments with compacted to non-compacted  ratio > 2.3:1 when measured in long axis views.   LVEDD:  55 mm   LVESD:  36 mm   LVEDV:  131 ml   LVESV:  59 ml   SV:  73  ml   CO:  5.8 L/minute   2. Normal right ventricular size, thickness and systolic function (LVEF = 53%) with no regional wall motion abnormalities.   3.  Normal biatrial size.   4. Normal size of the aortic root, ascending aorta and pulmonary artery.   5.  Mild mitral and trivial tricuspid regurgitation.   6.  No late gadolinium enhancement in the LV and RV myocardium.   7.  No pericardial effusion.   IMPRESSION: 1. Mild left ventricular dilatation with normal wall thickness and normal systolic function (LVEF = 55%) with no regional wall motion abnormalities.   There are pronounced trabeculations in the apical and mid segments with compacted to non-compacted ratio > 2.3:1 when measured in long axis views.   2. Normal right ventricular size, thickness and systolic function (LVEF = 53%) with no regional wall motion abnormalities. 3.  Mild mitral and trivial tricuspid regurgitation. 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 A standard CHF therapy is recommended. Lipid Panel    Component Value Date/Time   CHOL 147 09/28/2015 1217   TRIG 48 09/28/2015 1217   HDL 61 09/28/2015 1217   CHOLHDL 2.4 09/28/2015 1217   VLDL 10 09/28/2015 1217   LDLCALC 76 09/28/2015 1217      Wt Readings from Last 3 Encounters:  12/03/22 155 lb 12.8 oz (70.7 kg)  10/31/22 162 lb (73.5 kg)  07/16/22 159 lb (72.1 kg)      ASSESSMENT AND PLAN:   1 CHest pain.   Atypical but concerning  Not like reflux   Will set up for CCTA   Will add Imdur to regimen 30 mg     2  Hx  Noncompaction cardiomyopathy REcent echo without change  BP has been lower at times,have not pushed meds further     Monitor without arrhythmias   .  3  Dizziness Follow  ONe spell     Check CBC, CMET, trop, d  dimer    Current medicines are reviewed at length with the patient today.  The patient does not have concerns regarding medicines.  Signed, Dietrich Pates, MD  12/03/2022 8:14 PM    [facility] [address] [place], [place], [place]  [address] Phone: [phone]; Fax: [phone]

## 2022-12-01 NOTE — Telephone Encounter (Signed)
Pt c/o of Chest Pain: STAT if CP now or developed within 24 hours  1. Are you having CP right now? No  2. Are you experiencing any other symptoms (ex. SOB, nausea, vomiting, sweating)? Nauseous, sweating, felt like she was going to fall.  3. How long have you been experiencing CP? Since Saturday  4. Is your CP continuous or coming and going? Coming and going  5. Have you taken Nitroglycerin? Yes ?   Patient was at work on Saturday and started to feel tightness in her chest. Patient stated she went to the ER at [facility] on Saturday. Please advise.

## 2022-12-01 NOTE — Telephone Encounter (Signed)
Patient has been scheduled

## 2022-12-01 NOTE — Telephone Encounter (Signed)
Spoke with the patient who states that she went to the ER over the weekend for chest pain. She reports that testing was all reassuring, however she has continued to have chest pain. She reports that she has been taking nitroglycerin which does relieve her symptoms however they return about an hour later. She reports it feeling like a tightness in her chest. She is not currently having any other symptoms. Return to ER precautions have been reviewed.  It was recommended that she been seen by cardiology for a possible repeat stress test. She states that work will not let her return until she is seen by cardiology. Offered first available appointment on Friday, however she has a conflict with another appointment.

## 2022-12-03 ENCOUNTER — Ambulatory Visit: Payer: BC Managed Care – PPO | Attending: Internal Medicine | Admitting: Internal Medicine

## 2022-12-03 ENCOUNTER — Encounter: Payer: Self-pay | Admitting: Internal Medicine

## 2022-12-03 VITALS — BP 122/78 | HR 94 | Ht 62.0 in | Wt 155.8 lb

## 2022-12-03 DIAGNOSIS — Z01810 Encounter for preprocedural cardiovascular examination: Secondary | ICD-10-CM

## 2022-12-03 DIAGNOSIS — R079 Chest pain, unspecified: Secondary | ICD-10-CM | POA: Diagnosis not present

## 2022-12-03 DIAGNOSIS — Z79899 Other long term (current) drug therapy: Secondary | ICD-10-CM | POA: Diagnosis not present

## 2022-12-03 DIAGNOSIS — I429 Cardiomyopathy, unspecified: Secondary | ICD-10-CM

## 2022-12-03 LAB — CBC
Hematocrit: 40.2 % (ref 34.0–46.6)
Hemoglobin: 13.6 g/dL (ref 11.1–15.9)
MCH: 28.5 pg (ref 26.6–33.0)
MCHC: 33.8 g/dL (ref 31.5–35.7)
MCV: 84 fL (ref 79–97)
Platelets: 330 10*3/uL (ref 150–450)
RBC: 4.78 x10E6/uL (ref 3.77–5.28)
RDW: 14.2 % (ref 11.7–15.4)
WBC: 6 10*3/uL (ref 3.4–10.8)

## 2022-12-03 LAB — COMPREHENSIVE METABOLIC PANEL
ALT: 10 IU/L (ref 0–32)
AST: 14 IU/L (ref 0–40)
Albumin: 4.4 g/dL (ref 3.9–4.9)
Alkaline Phosphatase: 65 IU/L (ref 44–121)
BUN/Creatinine Ratio: 14 (ref 9–23)
BUN: 12 mg/dL (ref 6–24)
Bilirubin Total: 0.3 mg/dL (ref 0.0–1.2)
CO2: 25 mmol/L (ref 20–29)
Calcium: 9.3 mg/dL (ref 8.7–10.2)
Chloride: 107 mmol/L — ABNORMAL HIGH (ref 96–106)
Creatinine, Ser: 0.83 mg/dL (ref 0.57–1.00)
Globulin, Total: 2.5 g/dL (ref 1.5–4.5)
Glucose: 90 mg/dL (ref 70–99)
Potassium: 4.3 mmol/L (ref 3.5–5.2)
Sodium: 139 mmol/L (ref 134–144)
Total Protein: 6.9 g/dL (ref 6.0–8.5)
eGFR: 88 mL/min/{1.73_m2} (ref >59)

## 2022-12-03 LAB — D-DIMER, QUANTITATIVE: D-DIMER: 0.23 mg/L FEU (ref 0.00–0.49)

## 2022-12-03 LAB — TROPONIN T: Troponin T (Highly Sensitive): <6 ng/L (ref 0–14)

## 2022-12-03 MED ORDER — METOPROLOL TARTRATE 100 MG PO TABS: ORAL_TABLET | ORAL | 3 refills | Status: DC

## 2022-12-03 MED ORDER — ISOSORBIDE MONONITRATE ER 30 MG PO TB24: 30.0000 mg | ORAL_TABLET | Freq: Every day | ORAL | 3 refills | Status: DC

## 2022-12-03 MED ORDER — OMEPRAZOLE 20 MG PO CPDR: 20.0000 mg | DELAYED_RELEASE_CAPSULE | Freq: Every day | ORAL | 11 refills | Status: AC

## 2022-12-03 NOTE — Patient Instructions (Signed)
Medication Instructions:  START OMEPRAZOLE AND IMDUR 30 MG A DAY  *If you need a refill on your cardiac medications before your next appointment, please call your pharmacy*   Lab Work: CMET, CBC, D DIMER, TROPONIN TODAY  If you have labs (blood work) drawn today and your tests are completely normal, you will receive your results only by: MyChart Message (if you have MyChart) OR A paper copy in the mail If you have any lab test that is abnormal or we need to change your treatment, we will call you to review the results.   Testing/Procedures:   Your cardiac CT will be scheduled at one of the below locations:   [facility] [address] [place], [place] [address] [phone]   At [facility], please arrive at the [facility] and Children's Entrance (Entrance C2) of [facility] 30 minutes prior to test start time. You can use the FREE valet parking offered at entrance C (encouraged to control the heart rate for the test)  Proceed to the [facility] Radiology Department (first floor) to check-in and test prep.  All radiology patients and guests should use entrance C2 at [facility], accessed from [facility], even though the hospital's physical address listed is [address]     Please follow these instructions carefully (unless otherwise directed):  An IV will be required for this test and Nitroglycerin will be given.  Hold all erectile dysfunction medications at least 3 days (72 hrs) prior to test. (Ie viagra, cialis, sildenafil, tadalafil, etc)   On the Night Before the Test: Be sure to Drink plenty of water. Do not consume any caffeinated/decaffeinated beverages or chocolate 12 hours prior to your test. Do not take any antihistamines 12 hours prior to your test.  On the Day of the Test: Drink plenty of water until 1 hour prior to the test. Do not eat any food 1 hour prior to test. You may take your regular  medications prior to the test.  Take metoprolol (Lopressor) two hours prior to test. FEMALES- please wear underwire-free bra if available, avoid dresses & tight clothing       After the Test: Drink plenty of water. After receiving IV contrast, you may experience a mild flushed feeling. This is normal. On occasion, you may experience a mild rash up to 24 hours after the test. This is not dangerous. If this occurs, you can take Benadryl 25 mg and increase your fluid intake. If you experience trouble breathing, this can be serious. If it is severe call 911 IMMEDIATELY. If it is mild, please call our office.  We will call to schedule your test 2-4 weeks out understanding that some insurance companies will need an authorization prior to the service being performed.   For more information and frequently asked questions, please visit our website : [email or web address]  For non-scheduling related questions, please contact the cardiac imaging nurse navigator should you have any questions/concerns: Cardiac Imaging Nurse Navigators Direct Office Dial: [phone]   For scheduling needs, including cancellations and rescheduling, please call [place], [phone].    Follow-Up: At [facility], you and your health needs are our priority.  As part of our continuing mission to provide you with exceptional heart care, we have created designated Provider Care Teams.  These Care Teams include your primary Cardiologist (physician) and Advanced Practice Providers (APPs -  Physician Assistants and Nurse Practitioners) who all work together to provide you with the care  you need, when you need it.  We recommend signing up for the patient portal called "MyChart".  Sign up information is provided on this After Visit Summary.  MyChart is used to connect with patients for Virtual Visits (Telemedicine).  Patients are able to view lab/test results, encounter notes, upcoming appointments, etc.   Non-urgent messages can be sent to your provider as well.   To learn more about what you can do with MyChart, go to [email or web address].    Your next appointment:   2 month(s)  Provider:   Dietrich Pates, MD     Other Instructions

## 2022-12-08 ENCOUNTER — Telehealth: Payer: Self-pay | Admitting: Internal Medicine

## 2022-12-08 ENCOUNTER — Telehealth (HOSPITAL_COMMUNITY): Payer: Self-pay | Admitting: *Deleted

## 2022-12-08 MED ORDER — METOPROLOL TARTRATE 100 MG PO TABS: ORAL_TABLET | ORAL | 0 refills | Status: AC

## 2022-12-08 NOTE — Telephone Encounter (Signed)
Patient returning call about her upcoming cardiac imaging study; pt verbalizes understanding of appt date/time, parking situation and where to check in, pre-test NPO status and medications ordered, and verified current allergies; name and call back number provided for further questions should they arise  Larey Brick RN Navigator Cardiac Imaging [facility] [phone] office [phone] cell  Patient to take 100mg  metoprolol tartrate two hours prior to her cardiac CT scan. She is aware to arrive at 12 PM.

## 2022-12-08 NOTE — Telephone Encounter (Signed)
Patient returned RN's call. 

## 2022-12-08 NOTE — Telephone Encounter (Signed)
Attempted to call patient regarding upcoming cardiac CT appointment. Left message on voicemail with name and callback number Hayley Sharpe RN Navigator Cardiac Imaging [facility] [phone] Office   

## 2022-12-08 NOTE — Telephone Encounter (Signed)
New Message:    Patient says she is scheduled for a procedure tomorrow. She wants Dr Charlott Rakes nurse to know that the medicine she needs to take, is not ready.

## 2022-12-08 NOTE — Telephone Encounter (Signed)
Left message for patient informing her Rx for Lopressor 100mg  was sent to [facility] on [facility], this is to be taken 2 hours before cardiac CT scan.  Provided office number for callback if any questions.

## 2022-12-08 NOTE — Telephone Encounter (Signed)
Left a message to call back.  Resent prescription for metoprolol 100 mg PO once 2 hours prior to Cardiac CT. (Last noted HR 94 on 12/03/22)

## 2022-12-09 ENCOUNTER — Ambulatory Visit (HOSPITAL_COMMUNITY)
Admission: RE | Admit: 2022-12-09 | Discharge: 2022-12-09 | Disposition: A | Discharge status: Home / Self Care | Payer: BC Managed Care – PPO | Source: Ambulatory Visit | Attending: Internal Medicine | Admitting: Internal Medicine

## 2022-12-09 DIAGNOSIS — I429 Cardiomyopathy, unspecified: Secondary | ICD-10-CM | POA: Insufficient documentation

## 2022-12-09 DIAGNOSIS — Z01810 Encounter for preprocedural cardiovascular examination: Secondary | ICD-10-CM | POA: Insufficient documentation

## 2022-12-09 DIAGNOSIS — R079 Chest pain, unspecified: Secondary | ICD-10-CM | POA: Diagnosis not present

## 2022-12-09 DIAGNOSIS — Z79899 Other long term (current) drug therapy: Secondary | ICD-10-CM | POA: Diagnosis present

## 2022-12-09 MED ORDER — NITROGLYCERIN 0.4 MG SL SUBL
0.8000 mg | SUBLINGUAL_TABLET | Freq: Once | SUBLINGUAL | Status: AC
  Administered 2022-12-09: 0.8 mg via SUBLINGUAL

## 2022-12-09 MED ORDER — NITROGLYCERIN 0.4 MG SL SUBL
SUBLINGUAL_TABLET | SUBLINGUAL | Status: AC
  Filled 2022-12-09: qty 2

## 2022-12-09 MED ORDER — IOHEXOL 350 MG/ML SOLN
95.0000 mL | Freq: Once | INTRAVENOUS | Status: AC | PRN
  Administered 2022-12-09: 95 mL via INTRAVENOUS

## 2022-12-10 ENCOUNTER — Encounter: Payer: Self-pay | Admitting: *Deleted

## 2022-12-10 ENCOUNTER — Telehealth: Payer: Self-pay | Admitting: Internal Medicine

## 2022-12-10 NOTE — Telephone Encounter (Signed)
-----   Message from [place] sent at 12/09/2022  9:50 PM EDT ----- Coronary arteries are normal.  No CAD Chest pain not clearly cardiac   NTG may relieve discomfort from small vessel spasm   May also relieve noncardiac pain OK to go back to work

## 2022-12-10 NOTE — Telephone Encounter (Signed)
Patient is calling requesting a callback from the nurse. She did not wish to disclose the reasoning.   Please advise.

## 2022-12-10 NOTE — Telephone Encounter (Signed)
I spoke with patient.  She is requesting return to work note.  CT results reviewed with patient.  Patient requests note to return to work on 8/11 with no restrictions.  Patient would like to pick up note in office.  Note left at front desk for patient.

## 2022-12-15 NOTE — Telephone Encounter (Signed)
Patient states she misplaced the return to note work she picked up and is requesting another be left up front for her to pick-up tomorrow.  Return to work letter left at front desk for patient.

## 2022-12-15 NOTE — Telephone Encounter (Signed)
Patient wants a call back to discuss her return to work note.

## 2022-12-18 ENCOUNTER — Telehealth: Payer: Self-pay

## 2022-12-18 DIAGNOSIS — K769 Liver disease, unspecified: Secondary | ICD-10-CM

## 2022-12-18 NOTE — Telephone Encounter (Signed)
-----   Message from [place] sent at 12/15/2022 12:21 AM EDT ----- Non heart portion of CT results in There is a lesion on liver that is not well defined by CT REcomm MRI with and without contrast to evaluate further

## 2022-12-18 NOTE — Telephone Encounter (Signed)
The patient has been notified of the result and verbalized understanding.  All questions (if any) were answered.     

## 2022-12-25 ENCOUNTER — Ambulatory Visit (HOSPITAL_BASED_OUTPATIENT_CLINIC_OR_DEPARTMENT_OTHER)
Admission: RE | Admit: 2022-12-25 | Discharge: 2022-12-25 | Disposition: A | Discharge status: Home / Self Care | Payer: BC Managed Care – PPO | Source: Ambulatory Visit | Attending: Internal Medicine | Admitting: Internal Medicine

## 2022-12-25 DIAGNOSIS — K769 Liver disease, unspecified: Secondary | ICD-10-CM | POA: Diagnosis present

## 2022-12-25 DIAGNOSIS — D3502 Benign neoplasm of left adrenal gland: Secondary | ICD-10-CM | POA: Diagnosis not present

## 2022-12-25 MED ORDER — GADOBUTROL 1 MMOL/ML IV SOLN
7.0000 mL | Freq: Once | INTRAVENOUS | Status: AC | PRN
  Administered 2022-12-25: 7 mL via INTRAVENOUS
  Filled 2022-12-25: qty 7.5

## 2022-12-29 ENCOUNTER — Other Ambulatory Visit: Payer: Self-pay

## 2022-12-29 DIAGNOSIS — K769 Liver disease, unspecified: Secondary | ICD-10-CM

## 2023-01-27 ENCOUNTER — Encounter: Payer: Self-pay | Admitting: Neurology

## 2023-01-27 ENCOUNTER — Ambulatory Visit: Payer: BC Managed Care – PPO | Admitting: Neurology

## 2023-01-27 VITALS — BP 127/83 | HR 77 | Resp 20 | Wt 160.0 lb

## 2023-01-27 DIAGNOSIS — G43109 Migraine with aura, not intractable, without status migrainosus: Secondary | ICD-10-CM | POA: Diagnosis not present

## 2023-01-27 MED ORDER — TOPIRAMATE 200 MG PO TABS: ORAL_TABLET | ORAL | 3 refills | Status: DC

## 2023-01-27 NOTE — Progress Notes (Signed)
NEUROLOGY FOLLOW UP OFFICE NOTE  Tasha Ross [id number] [birth year]  HISTORY OF PRESENT ILLNESS: I had the pleasure of seeing Tasha Ross in follow-up in the neurology clinic on 01/27/2023.  She is alone in the office today. The patient was last seen 6 months ago for vertigo, headaches, and syncope. Brain MRI unremarkable. She had an abnormal echo and holter monitor with heart rates up to 180-190 bpm, amitriptyline felt to be potential cause. 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 On her last visit, she was reporting migraines around twice a month that could last for a week. We discussed increasing Topiramate to 150mg  daily, however instead of cutting the tablets, she started taking 2 tablets daily (200mg  daily) and reports that she has not had a lot of headaches. She had stopped the Venlafaxine with no issues. No dizziness, vision changes, focal numbness/tingling/weakness. She has to keep hydrated otherwise she feels nauseated taking the medication. Sleep is affected by her job, she works 3rd shift and sleeps on her days off. No falls.    History on Initial Assessment 01/17/2015: This is a 47 yo RH woman presenting with vertigo, headaches, and episodes of passing out that started in 2009 or 2010. She had seen neurologist Dr. Shane Ross in 2012 while living in [place], reporting that she is having the exact same symptoms she had, they had gotten under control while she was seeing him, with no episodes for a year, until January 2016 as she was transitioning for her move to [facility] last March 2016. She reports migraines followed a couple of days later by vertigo. She would brace herself, trying to sit still, but would lose consciousness. She has fallen to the floor at work. Sometimes she can brace herself, other times there is no warning. Symptoms are brief, she would wake up on the floor. She was admitted to [facility]  last 10/22/14 for near syncope, she reported right-sided weakness with slurred speech, that improved during transport. Glucose noted to be 72. She had an MRI brain without contrast with no acute intracranial changes. MRA brain reported a 1.45mm bulbous appearance of the distal left PICA branches in the region of the inferior cerebellar vermis. It may represent a vascular loop, however a small aneurysm is not entirely excluded. 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 Records from her previous neurologist were reviewed. He was concerned that dizziness and vertigo were not physiological in nature. He had done an MRI/MRA brain reported as normal as well. TSH, RPR, CBC normal. B12 low normal at 278, she had received replacement treatment. For migraine prophylaxis, she was started on amitriptyline in 2012, then switched to Topamax in 2013. She reports migraines are retro-orbital, sometimes dull or sharp pain, with phonophobia, nausea. She recalls taking amitriptyline and Topamax, and states these were stopped because the episodes stopped. She recalls taking Imitrex, Fioricet, Maxalt which would help initially. She feels the amitriptyline and citalopram in the past helped put her in a calm mode. She has been having more frequent migraines occurring twice a week, lasting 3-4 hours. Ibuprofen does not help. The vertigo occurs a couple of days after a migraine, with spinning sensation lasting up to a week. She usually gets less than  6 hours of sleep, feeling "real agitated." She reports the last syncopal episode was in June. When asked about stress, she does indicate that she has been working for [employer] since 2014, but had  to move to a different location with her move in January.   She had a syncopal episode at work last 08/19/16 and has had a headache, neck pain, and seeing floaters since then. 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 No convulsive activity seen on video. She came to and recalls feeling sick to her stomach and unable to talk, with her words not coming out clear. She reports another syncopal episode in March on the way to work, she felt a pressure in her chest. She was recently treated with antibiotic for congestion, but has started feeling ill again. She wonders if weather changes are a trigger, she has become more hypersensitive to smells since Spring started.   Prior preventative medications: amitriptyline, topamax, Toprol, zonisamide, citalopram Prior rescue medications: imitrex, relpax, maxalt, zomig, fioricet  PAST MEDICAL HISTORY: Past Medical History:  Diagnosis Date   Blood transfusion without reported diagnosis    Cardiomyopathy (HCC)    a. Echo 01/2015: EF 45-50% with diffuse HK. // Echo 06/2018: EF 55, no RWMA, prominent apical trabeculation   History of stress test    a. ETT 11/16:  normal   Migraine    Near syncope    Snoring 04/06/2015   SVT (supraventricular tachycardia)    a. Seen on event monitor 01/2015.   Vertigo     MEDICATIONS: Current Outpatient Medications on File Prior to Visit  Medication Sig Dispense Refill   azelastine (ASTELIN) 0.1 % nasal spray Place 2 sprays into both nostrils daily.  11   cyclobenzaprine (FLEXERIL) 10 MG tablet Take by mouth.     fluticasone (FLONASE) 50 MCG/ACT nasal spray Place 2 sprays into both nostrils daily.     isosorbide mononitrate (IMDUR) 30 MG 24 hr tablet Take 1 tablet (30 mg total) by mouth daily. 30 tablet 3   metoprolol succinate (TOPROL-XL) 25 MG 24 hr tablet Take 1 tablet (25 mg total) by mouth daily. 90 tablet 3   metoprolol tartrate (LOPRESSOR) 100 MG tablet Take one tablet (100 mg) by mouth 2 hours prior to your cardiac CT. 1 tablet 0   omeprazole (PRILOSEC) 20 MG capsule Take 1 capsule (20 mg total) by mouth daily. 30 capsule 11   sodium fluoride (FLUORISHIELD) 1.1 % GEL dental gel  PreviDent 5000 Booster Plus 1.1 % dental paste  USE TWICE A DAY     topiramate (TOPAMAX) 100 MG tablet Take 1 and 1/2 tablets daily 135 tablet 3   venlafaxine XR (EFFEXOR XR) 75 MG 24 hr capsule TAKE 1 CAPSULE AT BEDTIME 90 capsule 3   nitroGLYCERIN (NITROSTAT) 0.4 MG SL tablet Place 1 tablet (0.4 mg total) under the tongue every 5 (five) minutes as needed for chest pain. (Patient not taking: Reported on 01/27/2023) 25 tablet 3   No current facility-administered medications on file prior to visit.    ALLERGIES: Allergies  Allergen Reactions   Ibuprofen Other (See Comments) and Rash    Per Neurologist request Per Neurologist request   Amitriptyline Other (See Comments) and Rash    Causing headaches and head pains Causing headaches and head pains   Clindamycin Rash   Elemental Sulfur Swelling   Sulfa Antibiotics Swelling and Rash   Sulfamethoxazole Swelling    FAMILY HISTORY: Family History  Problem Relation Age of Onset   Stomach cancer Father    Heart disease Maternal Aunt  Patient does not know details   Ovarian cancer Maternal Aunt    Hypertension Maternal Aunt    Lupus Paternal Aunt    Throat cancer Paternal Uncle    Leukemia Maternal Grandmother    Diabetes Maternal Grandmother    Glaucoma Paternal Grandmother    Dementia Paternal Grandmother    Hypertension Paternal Grandmother    Lupus Cousin    Hypertension Other    Diabetes Other    Stroke Neg Hx    Fainting Neg Hx    Migraines Neg Hx    Seizures Neg Hx     SOCIAL HISTORY: Social History   Socioeconomic History   Marital status: Single    Spouse name: Not on file   Number of children: 4   Years of education: Not on file   Highest education level: Not on file  Occupational History   Occupation: Furniture conservator/restorer  Tobacco Use   Smoking status: Never   Smokeless tobacco: Never  Vaping Use   Vaping status: Never Used  Substance and Sexual Activity   Alcohol use: Not Currently    Comment: Rare -  once or twice a month.   Drug use: No   Sexual activity: Yes  Other Topics Concern   Not on file  Social History Narrative   Lives with 3 of her sons.  Has 4 sons.  Works for [employer].  Education: some college. Right handed    Social Determinants of Health   Financial Resource Strain: Low Risk  (05/22/2022)   Received from [facility], [facility]   Overall Financial Resource Strain (CARDIA)    Difficulty of Paying Living Expenses: Not hard at all  Food Insecurity: No Food Insecurity (05/22/2022)   Received from [facility], [facility]   Hunger Vital Sign    Worried About Running Out of Food in the Last Year: Never true    Ran Out of Food in the Last Year: Never true  Transportation Needs: No Transportation Needs (05/22/2022)   Received from [facility], [facility]   PRAPARE - Transportation    Lack of Transportation (Medical): No    Lack of Transportation (Non-Medical): No  Physical Activity: Not on file  Stress: No Stress Concern Present (05/23/2020)   Received from [facility], [facility] - Occupational Stress Questionnaire    Feeling of Stress : Not at all  Social Connections: Unknown (09/03/2021)   Received from [facility], [facility]   Social Network    Social Network: Not on file  Intimate Partner Violence: Unknown (08/07/2021)   Received from [facility], [facility]   HITS    Physically Hurt: Not on file    Insult or Talk Down To: Not on file    Threaten Physical Harm: Not on file    Scream or Curse: Not on file     PHYSICAL EXAM: Vitals:   01/27/23 0838  BP: 127/83  Pulse: 77  Resp: 20  SpO2: 95%   General: No acute distress Head:  Normocephalic/atraumatic Skin/Extremities: No rash, no edema Neurological Exam: alert and awake. No aphasia or dysarthria. Fund of knowledge is appropriate.  Attention and concentration are normal.   Cranial nerves: Pupils equal, round. Extraocular movements  intact with no nystagmus. Visual fields full.  No facial asymmetry.  Motor: Bulk and tone normal, muscle strength 5/5 throughout with no pronator drift.   Finger to nose testing intact.  Gait narrow-based and steady, able to tandem  walk adequately.  Romberg negative.   IMPRESSION: This is a pleasant 47 yo RH woman with a history of headaches, dizziness, and syncope since 2009 or 2010, symptoms had quieted down, then recurred at the beginning of 2016. On review of records, her previous neurologist was concerned dizziness and vertigo were not physiologic in nature. She did not tolerate amitriptyline for headache prophylaxis, in addition, Cardiology felt this was causing SVT on holter monitor. She is on Toprol for SVT with no further syncopal episodes since April 2018. She has had good response to Topiramate 200mg  daily with no significant headaches on increased dose. Prescription for Topiramate 200mg : Take 1 tablet daily was sent in. We discussed sleep hygiene. Follow-up in 6 months, call for any changes.   Thank you for allowing me to participate in her care.  Please do not hesitate to call for any questions or concerns.    Patrcia Dolly, M.D.   CC: [facility]

## 2023-01-27 NOTE — Patient Instructions (Addendum)
Always good to see you. A prescription has been sent for Topamax (Topiramate) 200mg : take 1 tablet every morning. Follow-up in 6 months, call for any changes.

## 2023-02-06 ENCOUNTER — Other Ambulatory Visit: Payer: Self-pay | Admitting: Podiatry

## 2023-02-11 DIAGNOSIS — G8929 Other chronic pain: Secondary | ICD-10-CM | POA: Insufficient documentation

## 2023-02-12 LAB — COLOGUARD: COLOGUARD: NEGATIVE

## 2023-02-12 LAB — EXTERNAL GENERIC LAB PROCEDURE: COLOGUARD: NEGATIVE

## 2023-02-25 ENCOUNTER — Other Ambulatory Visit: Payer: Self-pay | Admitting: Internal Medicine

## 2023-04-10 ENCOUNTER — Ambulatory Visit (HOSPITAL_BASED_OUTPATIENT_CLINIC_OR_DEPARTMENT_OTHER): Admission: RE | Admit: 2023-04-10 | Payer: BC Managed Care – PPO | Source: Ambulatory Visit

## 2023-04-13 ENCOUNTER — Ambulatory Visit (INDEPENDENT_AMBULATORY_CARE_PROVIDER_SITE_OTHER): Payer: BC Managed Care – PPO

## 2023-04-13 ENCOUNTER — Telehealth: Payer: Self-pay | Admitting: Neurology

## 2023-04-13 DIAGNOSIS — G43109 Migraine with aura, not intractable, without status migrainosus: Secondary | ICD-10-CM | POA: Diagnosis not present

## 2023-04-13 MED ORDER — KETOROLAC TROMETHAMINE 60 MG/2ML IM SOLN
60.0000 mg | Freq: Once | INTRAMUSCULAR | Status: AC
  Administered 2023-04-13: 60 mg via INTRAMUSCULAR

## 2023-04-13 MED ORDER — METOCLOPRAMIDE HCL 5 MG/ML IJ SOLN
10.0000 mg | Freq: Once | INTRAMUSCULAR | Status: AC
  Administered 2023-04-13: 10 mg via INTRAMUSCULAR

## 2023-04-13 MED ORDER — DIPHENHYDRAMINE HCL 50 MG/ML IJ SOLN
50.0000 mg | Freq: Once | INTRAMUSCULAR | Status: AC
  Administered 2023-04-13: 25 mg via INTRAMUSCULAR

## 2023-04-13 NOTE — Telephone Encounter (Signed)
Ok for headache cocktail, thanks

## 2023-04-13 NOTE — Progress Notes (Signed)
Pt came into the office for a headache cocktail she tolerated it well. Driver was in the office

## 2023-04-13 NOTE — Telephone Encounter (Signed)
Pt called in stating she has had a headache for the last 2 days and she cannot get rid of it. Her rescue medication takes the edge off, but then it comes right back. She has been nauseous almost the whole time. She is wondering if she could get a headache cocktail? She states she has a driver.

## 2023-04-13 NOTE — Telephone Encounter (Signed)
Pt called advised that dr Karel Jarvis stated she can come in for a headache cocktail. Pt informed that a driver has to come up to the office with her, p verbalized understanding,

## 2023-04-13 NOTE — Telephone Encounter (Signed)
Pt called in and left a message with the access nurse today before we opened. She states she has a migraine for 2 days. Did not go to ED due to the cost. Meds help, but do not relieve it. She is vomiting today.

## 2023-04-14 ENCOUNTER — Telehealth: Payer: Self-pay

## 2023-04-14 MED ORDER — PREDNISONE 10 MG (21) PO TBPK: ORAL_TABLET | ORAL | 0 refills | Status: AC

## 2023-04-14 NOTE — Telephone Encounter (Signed)
patient called on call DR at 11pm last night asking for pain medication because no relief from migraine cocktail. Was told to go to ER but looks like she did not. Pls let her know that if no improvement, we can send in a course of steroid to hopefully break this headache.

## 2023-04-14 NOTE — Telephone Encounter (Signed)
Pt called she would like to try the prednisone

## 2023-06-01 ENCOUNTER — Encounter: Payer: Self-pay | Admitting: Podiatry

## 2023-06-01 ENCOUNTER — Ambulatory Visit (INDEPENDENT_AMBULATORY_CARE_PROVIDER_SITE_OTHER): Payer: 59

## 2023-06-01 ENCOUNTER — Ambulatory Visit: Payer: 59 | Admitting: Podiatry

## 2023-06-01 DIAGNOSIS — M778 Other enthesopathies, not elsewhere classified: Secondary | ICD-10-CM | POA: Diagnosis not present

## 2023-06-01 DIAGNOSIS — M2011 Hallux valgus (acquired), right foot: Secondary | ICD-10-CM

## 2023-06-01 MED ORDER — BETAMETHASONE SOD PHOS & ACET 6 (3-3) MG/ML IJ SUSP
3.0000 mg | Freq: Once | INTRAMUSCULAR | Status: AC
  Administered 2023-06-01: 3 mg via INTRA_ARTICULAR
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
## 2023-06-11 DIAGNOSIS — Z90710 Acquired absence of both cervix and uterus: Secondary | ICD-10-CM | POA: Insufficient documentation

## 2023-06-14 DIAGNOSIS — E559 Vitamin D deficiency, unspecified: Secondary | ICD-10-CM | POA: Insufficient documentation

## 2023-06-22 ENCOUNTER — Encounter: Payer: Self-pay | Admitting: Podiatry

## 2023-06-22 ENCOUNTER — Ambulatory Visit: Payer: 59 | Admitting: Podiatry

## 2023-06-22 DIAGNOSIS — M205X1 Other deformities of toe(s) (acquired), right foot: Secondary | ICD-10-CM

## 2023-06-22 DIAGNOSIS — M7751 Other enthesopathy of right foot: Secondary | ICD-10-CM

## 2023-06-22 MED ORDER — BETAMETHASONE SOD PHOS & ACET 6 (3-3) MG/ML IJ SUSP
3.0000 mg | Freq: Once | INTRAMUSCULAR | Status: AC
  Administered 2023-06-22: 3 mg via INTRA_ARTICULAR
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
## 2023-06-30 ENCOUNTER — Encounter: Payer: Self-pay | Admitting: Dermatology

## 2023-06-30 ENCOUNTER — Ambulatory Visit: Payer: BC Managed Care – PPO | Admitting: Dermatology

## 2023-06-30 VITALS — BP 120/81 | HR 83

## 2023-06-30 DIAGNOSIS — L700 Acne vulgaris: Secondary | ICD-10-CM | POA: Diagnosis not present

## 2023-06-30 DIAGNOSIS — L821 Other seborrheic keratosis: Secondary | ICD-10-CM | POA: Diagnosis not present

## 2023-06-30 MED ORDER — ALTRENO 0.05 % EX LOTN: 1.0000 | TOPICAL_LOTION | Freq: Every evening | CUTANEOUS | 3 refills | Status: AC

## 2023-06-30 NOTE — Progress Notes (Signed)
   New Patient Visit   Subjective  Tasha Ross is a 48 y.o. female who presents for the following: Acne  Patient states she has acne located at the face that she would like to have examined. Patient reports the areas have been there for several months. She reports the areas are not bothersome. Patient rates irritation 0 out of 10. She states that the areas have not spread. Patient reports she has previously been treated for these areas. She was prescribed a topical that caused allergic reaction but she is unsure of the medication. She is currently washing with Ambi Black soap in the am.  The following portions of the chart were reviewed this encounter and updated as appropriate: medications, allergies, medical history  Review of Systems:  No other skin or systemic complaints except as noted in HPI or Assessment and Plan.  Objective  Well appearing patient in no apparent distress; mood and affect are within normal limits.  A focused examination was performed of the following areas: Face         Relevant exam findings are noted in the Assessment and Plan.    Assessment & Plan   ACNE VULGARIS Exam: Open comedones and inflammatory papules  Flared  Assessment: Patient with a history of acne currently using tretinoin (possibly adapalene) inconsistently, about 3 nights per week, due to overnight work schedule. Reports clogged skin and occasional patchy dryness without significant improvement in skin texture or complexion. Previous treatments caused a reaction; specific agent unknown. Patient has sensitive skin.   Treatment Plan: - Recommended applying Cicplast by LRP as moisturizer, Double Tolerine, and Lipkar depending on the dryness - We will plan to prescribe Altreno 0.05% to apply 2 nights weekly, after 1 Month increase to 3 nights weekly - Recommended applying Hyaluronic Acid in the AM and PM  - Recommended discontinuing Ambi Black Soap - Recommended gentle daily facial  cleanser (Provided samples of LRP and Cerave), instructed to use for one week to determine how her skin tolerates   Dermatosis Papulosa Nigra  Skin Care: Dermatosis Papulosa Nigra can resolve with light electrodesiccation. Expectations: Dermatosis Papulosa Gilford Silvius are benign growths. No treatment is necessary. Plan: Cosmetic Quote The patient received the following cosmetic quote  Other Procedure(Skin Tags): Quote for cosmetic removal:  $200 to remove up to 15  $300 to remove on 2 areas  $400 to removal on 3 areas     No follow-ups on file.  Documentation: I have reviewed the above documentation for accuracy and completeness, and I agree with the above.  Stasia Cavalier, am acting as scribe for Langston Reusing, DO.  Langston Reusing, DO

## 2023-06-30 NOTE — Patient Instructions (Addendum)
 Hello Tasha Ross,  Thank you for visiting [facility] today.   Below is a summary of the essential instructions from our consultation:  Medication: Start applying Altreno for your acne. Initially, use it 2 nights a week for the first two weeks, then increase to 3 nights a week. Ensure not to use it on consecutive days.  Pharmacy: Your Altreno prescription will be processed through [facility]. They will reach out to you to gather your insurance details. The cost should not exceed $75.  Skincare Routine:   Morning: Begin by washing your face, then apply hyaluronic acid serum, followed by moisturizer, and finish with sunscreen.   Night: Wash your face, apply hyaluronic acid serum, use a pea-sized amount of Altreno, and then apply moisturizer.  Moisturizers: For daily use, opt for La Roche-Posay Double Repair. For instances of dry skin, use Triple Repair, and for dry patches, apply Cicoplast.  Cleansers: Discontinue the use of black soap. Transition to gentle cleansers such as La Roche-Posay or CeraVe. Test each one for a week to monitor for any adverse reactions.  Follow-Up: We will schedule a follow-up appointment in 3 months to evaluate your progress and discuss the potential inclusion of chemical peel agents or microneedling to reduce pore size.  Please adhere to these instructions meticulously, and should you have any questions or concerns, do not hesitate to contact our office.  Warm regards,  Dr. [facility]     VICHY Hyaluronic Acid Serum ([employer], [place] or [place])   Important Information   Due to recent changes in healthcare laws, you may see results of your pathology and/or laboratory studies on MyChart before the doctors have had a chance to review them. We understand that in some cases there may be results that are confusing or concerning to you. Please understand that not all results are received at the same time and often the doctors may need to interpret  multiple results in order to provide you with the best plan of care or course of treatment. Therefore, we ask that you please give [facility] 2 business days to thoroughly review all your results before contacting the office for clarification. Should we see a critical lab result, you will be contacted sooner.     If You Need Anything After Your Visit   If you have any questions or concerns for your doctor, please call our main line at [phone]. If no one answers, please leave a voicemail as directed and we will return your call as soon as possible. Messages left after 4 pm will be answered the following business day.    You may also send Tasha Ross a message via MyChart. We typically respond to MyChart messages within 1-2 business days.  For prescription refills, please ask your pharmacy to contact our office. Our fax number is [phone].  If you have an urgent issue when the clinic is closed that cannot wait until the next business day, you can page your doctor at the number below.     Please note that while we do our best to be available for urgent issues outside of office hours, we are not available 24/7.    If you have an urgent issue and are unable to reach Tasha Ross, you may choose to seek medical care at your doctor's office, retail clinic, urgent care center, or emergency room.   If you have a medical emergency, please immediately call 911 or go to the emergency department. In the event of inclement weather, please call our main line  at [phone] for an update on the status of any delays or closures.  Dermatology Medication Tips: Please keep the boxes that topical medications come in in order to help keep track of the instructions about where and how to use these. Pharmacies typically print the medication instructions only on the boxes and not directly on the medication tubes.   If your medication is too expensive, please contact our office at [phone] or send Tasha Ross a message through MyChart.     We are unable to tell what your co-pay for medications will be in advance as this is different depending on your insurance coverage. However, we may be able to find a substitute medication at lower cost or fill out paperwork to get insurance to cover a needed medication.    If a prior authorization is required to get your medication covered by your insurance company, please allow T[facility] Ross 1-2 business days to complete this process.   Drug prices often vary depending on where the prescription is filled and some pharmacies may offer cheaper prices.   The website www.goodrx.com contains coupons for medications through different pharmacies. The prices here do not account for what the cost may be with help from insurance (it may be cheaper with your insurance), but the website can give you the price if you did not use any insurance.  - You can print the associated coupon and take it with your prescription to the pharmacy.  - You may also stop by our office during regular business hours and pick up a GoodRx coupon card.  - If you need your prescription sent electronically to a different pharmacy, notify our office through [facility] or by phone at [phone]

## 2023-07-06 ENCOUNTER — Ambulatory Visit: Payer: BC Managed Care – PPO | Admitting: Dermatology

## 2023-07-14 ENCOUNTER — Telehealth: Payer: Self-pay

## 2023-07-14 NOTE — Telephone Encounter (Signed)
 Patient called and left a message - she is still having  a lot of cramping and pain in that right foot

## 2023-07-16 ENCOUNTER — Other Ambulatory Visit: Payer: Self-pay | Admitting: Podiatry

## 2023-07-16 DIAGNOSIS — M7751 Other enthesopathy of right foot: Secondary | ICD-10-CM

## 2023-07-16 DIAGNOSIS — M778 Other enthesopathies, not elsewhere classified: Secondary | ICD-10-CM

## 2023-07-22 ENCOUNTER — Encounter: Payer: Self-pay | Admitting: Podiatry

## 2023-07-22 ENCOUNTER — Ambulatory Visit: Payer: 59 | Admitting: Podiatry

## 2023-07-22 VITALS — Ht 62.0 in | Wt 160.0 lb

## 2023-07-22 DIAGNOSIS — M205X1 Other deformities of toe(s) (acquired), right foot: Secondary | ICD-10-CM | POA: Diagnosis not present
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
## 2023-10-01 ENCOUNTER — Ambulatory Visit: Payer: 59 | Admitting: Dermatology

## 2023-10-06 ENCOUNTER — Ambulatory Visit (INDEPENDENT_AMBULATORY_CARE_PROVIDER_SITE_OTHER)

## 2023-10-06 ENCOUNTER — Ambulatory Visit: Admitting: Podiatry

## 2023-10-06 ENCOUNTER — Encounter: Payer: Self-pay | Admitting: Podiatry

## 2023-10-06 ENCOUNTER — Ambulatory Visit

## 2023-10-06 VITALS — Ht 62.0 in | Wt 160.0 lb

## 2023-10-06 DIAGNOSIS — M205X1 Other deformities of toe(s) (acquired), right foot: Secondary | ICD-10-CM

## 2023-10-06 DIAGNOSIS — M7751 Other enthesopathy of right foot: Secondary | ICD-10-CM

## 2023-10-06 DIAGNOSIS — M2042 Other hammer toe(s) (acquired), left foot: Secondary | ICD-10-CM

## 2023-10-06 DIAGNOSIS — M2041 Other hammer toe(s) (acquired), right foot: Secondary | ICD-10-CM | POA: Diagnosis not present

## 2023-10-06 MED ORDER — BETAMETHASONE SOD PHOS & ACET 6 (3-3) MG/ML IJ SUSP
3.0000 mg | Freq: Once | INTRAMUSCULAR | Status: AC
  Administered 2023-10-06: 3 mg via INTRA_ARTICULAR
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
## 2023-10-12 ENCOUNTER — Ambulatory Visit
Admission: RE | Admit: 2023-10-12 | Discharge: 2023-10-12 | Disposition: A | Discharge status: Home / Self Care | Source: Ambulatory Visit | Attending: Podiatry | Admitting: Podiatry

## 2023-10-12 DIAGNOSIS — M7751 Other enthesopathy of right foot: Secondary | ICD-10-CM

## 2023-10-12 DIAGNOSIS — M778 Other enthesopathies, not elsewhere classified: Secondary | ICD-10-CM

## 2023-10-15 ENCOUNTER — Telehealth: Payer: Self-pay | Admitting: Urology

## 2023-10-15 NOTE — Telephone Encounter (Signed)
 Pt called stating that the pain in her right foot has not gotten better, she had an injection on 10/06/23.Pt wants to know If you want to see her (I explained today is your sx day) or what she can do for the pain/ cramp in right foot.  Please advise.  Pt's call back # - [phone]

## 2023-10-16 ENCOUNTER — Ambulatory Visit (INDEPENDENT_AMBULATORY_CARE_PROVIDER_SITE_OTHER)

## 2023-10-16 ENCOUNTER — Telehealth: Payer: Self-pay

## 2023-10-16 ENCOUNTER — Telehealth: Payer: Self-pay | Admitting: Neurology

## 2023-10-16 DIAGNOSIS — G43109 Migraine with aura, not intractable, without status migrainosus: Secondary | ICD-10-CM

## 2023-10-16 MED ORDER — METOCLOPRAMIDE HCL 5 MG/ML IJ SOLN
10.0000 mg | Freq: Once | INTRAMUSCULAR | Status: AC
  Administered 2023-10-16: 10 mg via INTRAMUSCULAR

## 2023-10-16 MED ORDER — KETOROLAC TROMETHAMINE 60 MG/2ML IM SOLN
60.0000 mg | Freq: Once | INTRAMUSCULAR | Status: AC
  Administered 2023-10-16: 60 mg via INTRAMUSCULAR

## 2023-10-16 MED ORDER — DIPHENHYDRAMINE HCL 50 MG/ML IJ SOLN
50.0000 mg | Freq: Once | INTRAMUSCULAR | Status: AC
  Administered 2023-10-16: 25 mg via INTRAMUSCULAR

## 2023-10-16 NOTE — Telephone Encounter (Signed)
 Pt called informed that she can get a headache cocktail and that she needs a driver ,

## 2023-10-16 NOTE — Telephone Encounter (Signed)
 Pt. Having extreme headache that she can't shake with normal meds, asking for a headache cocktail, please call back

## 2023-10-16 NOTE — Telephone Encounter (Signed)
 Pt came in today for a headache cocktail she stated that she knows that it may not work and that she doesn't want to set up and she wants a work note to be out for a week. Pt advised I can give her a note for today she said No, she wants a week, she  needs to rest. She said where is Dr Ty Gales I told her she was out of the office she said her Dr would not want her to suffer. I told her if the headache cocktail didn't work she could call back provider on call and get a prednisone  taper, she said NO she wants a note to be out a week, she would wait until I could talk to a DR.

## 2023-10-16 NOTE — Telephone Encounter (Signed)
 I talked to Dr Lydia Sams she was given a letter for today pt was not happy and left,

## 2023-11-05 ENCOUNTER — Ambulatory Visit: Payer: BC Managed Care – PPO | Admitting: Neurology

## 2023-11-05 ENCOUNTER — Encounter: Payer: Self-pay | Admitting: Neurology

## 2023-11-05 VITALS — BP 124/86 | HR 60 | Resp 20 | Wt 156.0 lb

## 2023-11-05 DIAGNOSIS — G43109 Migraine with aura, not intractable, without status migrainosus: Secondary | ICD-10-CM | POA: Diagnosis not present

## 2023-11-05 NOTE — Progress Notes (Signed)
 NEUROLOGY FOLLOW UP OFFICE NOTE  Tasha Ross [id number] [birth year]  HISTORY OF PRESENT ILLNESS: I had the pleasure of seeing Tasha Ross in follow-up in the neurology clinic on 11/05/2023.  The patient was last seen 9 months ago for vertigo, headaches, and syncope. Brain MRI unremarkable. She had an abnormal echo and holter monitor with heart rates up to 180-190 bpm, amitriptyline  felt to be potential cause. 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 On her last visit, she reported good response to Topiramate  200mg  daily. She has had 2 bouts of prolonged migraine occurring 6 months apart, she needed a migraine cocktail in December, then recently last month. The recent migraine lasted a week, it would taper briefly then come back. Migraines start with floaters, then she would not be able to focus. Maxalt does not help any more. Ubrelvy  sample did not help. No dizziness, focal numbness/tingling/weakness. No syncope since 2018. She denies any chest pain or palpitations. 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 No falls.   History on Initial Assessment 01/17/2015: This is a 48 yo RH woman presenting with vertigo, headaches, and episodes of passing out that started in 2009 or 2010. She had seen neurologist [name] in 2012 while living in [place], reporting that she is having the exact same symptoms she had, they had gotten under control while she was seeing him, with no episodes for a year, until January 2016 as she was transitioning for her move to [facility] last March 2016. She reports migraines followed a couple of days later by vertigo. She would brace herself, trying to sit still, but would lose consciousness. She has fallen to the floor at work. Sometimes she can brace herself, other times there is no warning. Symptoms are brief, she would wake up on the floor. She was  admitted to [facility] last 10/22/14 for near syncope, she reported right-sided weakness with slurred speech, that improved during transport. Glucose noted to be 72. She had an MRI brain without contrast with no acute intracranial changes. MRA brain reported a 1.25mm bulbous appearance of the distal left PICA branches in the region of the inferior cerebellar vermis. It may represent a vascular loop, however a small aneurysm is not entirely excluded. 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 Records from her previous neurologist were reviewed. He was concerned that dizziness and vertigo were not physiological in nature. He had done an MRI/MRA brain reported as normal as well. TSH, RPR, CBC normal. B12 low normal at 278, she had received replacement treatment. For migraine prophylaxis, she was started on amitriptyline  in 2012, then switched to Topamax  in 2013. She reports migraines are retro-orbital, sometimes dull or sharp pain, with phonophobia, nausea. She recalls taking amitriptyline  and Topamax , and states these were stopped because the episodes stopped. She recalls taking Imitrex , Fioricet, Maxalt which would help initially. She feels the amitriptyline  and citalopram in the past helped put her in a calm mode. She has been having more frequent migraines occurring twice a week, lasting 3-4 hours. Ibuprofen does not help. The vertigo occurs a couple of days after a migraine, with spinning sensation lasting up to a week. She usually gets less than  6 hours of sleep, feeling real agitated. She reports the last syncopal episode was in June. When asked about stress, she does indicate that she has been working for [employer] since  2014, but had to move to a different location with her move in January.   She had a syncopal episode at work last 08/19/16 and has had a headache, neck pain, and seeing floaters since then. She shows a video of the incident, it appears she lost  her balance and started falling backward, then she falls back into the deli section. No convulsive activity seen on video. She came to and recalls feeling sick to her stomach and unable to talk, with her words not coming out clear. She reports another syncopal episode in March on the way to work, she felt a pressure in her chest. She was recently treated with antibiotic for congestion, but has started feeling ill again. She wonders if weather changes are a trigger, she has become more hypersensitive to smells since Spring started.   Prior preventative medications: amitriptyline , topamax , Toprol , zonisamide , citalopram Prior rescue medications: imitrex , relpax, maxalt, zomig , fioricet, Ubrelvy    PAST MEDICAL HISTORY: Past Medical History:  Diagnosis Date   Blood transfusion without reported diagnosis    Cardiomyopathy (HCC)    a. Echo 01/2015: EF 45-50% with diffuse HK. // Echo 06/2018: EF 55, no RWMA, prominent apical trabeculation   History of stress test    a. ETT 11/16:  normal   Migraine    Near syncope    Snoring 04/06/2015   SVT (supraventricular tachycardia) (HCC)    a. Seen on event monitor 01/2015.   Vertigo     MEDICATIONS: Current Outpatient Medications on File Prior to Visit  Medication Sig Dispense Refill   azelastine (ASTELIN) 0.1 % nasal spray Place 2 sprays into both nostrils daily.  11   cetirizine (ZYRTEC) 10 MG tablet Take 10 mg by mouth daily.     cyclobenzaprine  (FLEXERIL ) 10 MG tablet Take by mouth.     fluticasone (FLONASE) 50 MCG/ACT nasal spray Place 2 sprays into both nostrils daily.     metoprolol  succinate (TOPROL -XL) 25 MG 24 hr tablet Take 1 tablet (25 mg total) by mouth daily. 90 tablet 3   metoprolol  tartrate (LOPRESSOR ) 100 MG tablet Take one tablet (100 mg) by mouth 2 hours prior to your cardiac CT. 1 tablet 0   nitroGLYCERIN  (NITROSTAT ) 0.4 MG SL tablet Place 1 tablet (0.4 mg total) under the tongue every 5 (five) minutes as needed for chest pain. 25  tablet 3   topiramate  (TOPAMAX ) 200 MG tablet Take 1 tablet every morning 90 tablet 3   Tretinoin  (ALTRENO ) 0.05 % LOTN Apply 1 Application topically at bedtime. 45 g 3   Vitamin D, Ergocalciferol, (DRISDOL) 1.25 MG (50000 UNIT) CAPS capsule Take 50,000 Units by mouth.     isosorbide  mononitrate (IMDUR ) 30 MG 24 hr tablet TAKE 1 TABLET BY MOUTH EVERY DAY 90 tablet 1   omeprazole  (PRILOSEC) 20 MG capsule Take 1 capsule (20 mg total) by mouth daily. (Patient not taking: Reported on 11/05/2023) 30 capsule 11   predniSONE  (STERAPRED UNI-PAK 21 TAB) 10 MG (21) TBPK tablet take 60mg  day 1, then 50mg  day 2, then 40mg  day 3, then 30mg  day 4, then 20mg  day 5, then 10mg  day 6, then STOP (Patient not taking: Reported on 11/05/2023) 21 tablet 0   sodium fluoride (FLUORISHIELD) 1.1 % GEL dental gel PreviDent 5000 Booster Plus 1.1 % dental paste  USE TWICE A DAY (Patient not taking: Reported on 11/05/2023)     No current facility-administered medications on file prior to visit.    ALLERGIES: Allergies  Allergen Reactions   Ibuprofen  Other (See Comments) and Rash    Per Neurologist request Per Neurologist request   Amitriptyline  Other (See Comments) and Rash    Causing headaches and head pains Causing headaches and head pains   Clindamycin  Rash   Elemental Sulfur Swelling   Sulfa Antibiotics Swelling and Rash   Sulfamethoxazole Swelling    FAMILY HISTORY: Family History  Problem Relation Age of Onset   Stomach cancer Father    Heart disease Maternal Aunt        Patient does not know details   Ovarian cancer Maternal Aunt    Hypertension Maternal Aunt    Lupus Paternal Aunt    Throat cancer Paternal Uncle    Leukemia Maternal Grandmother    Diabetes Maternal Grandmother    Glaucoma Paternal Grandmother    Dementia Paternal Grandmother    Hypertension Paternal Grandmother    Lupus Cousin    Hypertension Other    Diabetes Other    Stroke Neg Hx    Fainting Neg Hx    Migraines Neg Hx     Seizures Neg Hx     SOCIAL HISTORY: Social History   Socioeconomic History   Marital status: Single    Spouse name: Not on file   Number of children: 4   Years of education: Not on file   Highest education level: Not on file  Occupational History   Occupation: Furniture conservator/restorer  Tobacco Use   Smoking status: Never    Passive exposure: Never   Smokeless tobacco: Never  Vaping Use   Vaping status: Never Used  Substance and Sexual Activity   Alcohol use: Not Currently    Comment: Rare - once or twice a month.   Drug use: No   Sexual activity: Yes  Other Topics Concern   Not on file  Social History Narrative   Lives with 3 of her sons.  Has 4 sons.  Works for [employer].  Education: some college. Right handed    Social Drivers of Health   Financial Resource Strain: Low Risk  (05/13/2023)   Received from [facility]   Overall Financial Resource Strain (CARDIA)    Difficulty of Paying Living Expenses: Not hard at all  Food Insecurity: No Food Insecurity (05/13/2023)   Received from [facility]   Hunger Vital Sign    Within the past 12 months, you worried that your food would run out before you got the money to buy more.: Never true    Within the past 12 months, the food you bought just didn't last and you didn't have money to get more.: Never true  Transportation Needs: No Transportation Needs (05/13/2023)   Received from [facility] - Transportation    Lack of Transportation (Medical): No    Lack of Transportation (Non-Medical): No  Physical Activity: Unknown (05/13/2023)   Received from [facility]   Exercise Vital Sign    On average, how many days per week do you engage in moderate to strenuous exercise (like a brisk walk)?: 3 days    Minutes of Exercise per Session: Not on file  Stress: No Stress Concern Present (05/13/2023)   Received from [facility] - Occupational Stress Questionnaire    Feeling of Stress : Not  at all  Social Connections: Socially Integrated (05/13/2023)   Received from [facility]   Social Network    How would you rate your social network (family, work, friends)?: Good participation with  social networks  Intimate Partner Violence: Not At Risk (05/13/2023)   Received from [facility]   HITS    Over the last 12 months how often did your partner physically hurt you?: Never    Over the last 12 months how often did your partner insult you or talk down to you?: Never    Over the last 12 months how often did your partner threaten you with physical harm?: Never    Over the last 12 months how often did your partner scream or curse at you?: Never     PHYSICAL EXAM: Vitals:   11/05/23 0832  BP: 124/86  Pulse: 60  Resp: 20  SpO2: 99%   General: No acute distress Head:  Normocephalic/atraumatic Skin/Extremities: No rash, no edema Neurological Exam: alert and awake. No aphasia or dysarthria. Fund of knowledge is appropriate. Attention and concentration are normal.   Cranial nerves: Pupils equal, round. Extraocular movements intact with no nystagmus. Visual fields full.  No facial asymmetry.  Motor: Bulk and tone normal, muscle strength 5/5 throughout with no pronator drift.   Finger to nose testing intact.  Gait narrow-based and steady, able to tandem walk adequately.  Romberg negative.   IMPRESSION: This is a pleasant 48 yo RH woman with a history of headaches, dizziness, and syncope since 2009 or 2010, symptoms had quieted down, then recurred at the beginning of 2016. On review of records, her previous neurologist was concerned dizziness and vertigo were not physiologic in nature. She did not tolerate amitriptyline  for headache prophylaxis, in addition, Cardiology felt this was causing SVT on holter monitor. She is on Toprol  for SVT with no further syncopal episodes since April 2018. Migraines overall controlled on Topiramate  200mg  BID, she has had 2 migraine exacerbations 6 months apart  however they would last for a week. She will let us  know when she needs refills for 200mg  tablet. We discussed sleep hygiene with migraines, she is working with her job looking into 1st shift. Follow-up in 6 months, call for any changes.    Thank you for allowing me to participate in her care.  Please do not hesitate to call for any questions or concerns.    Darice Shivers, M.D.   CC: [facility]

## 2023-11-05 NOTE — Patient Instructions (Signed)
 Good to see you.  Continue Topiramate  200mg  daily. Let me know when you are ready for next refill and I will send the Topiramate  200mg  tablet: take 1 tablet daily.  2. Follow-up in 6 months, call for any changes

## 2023-12-23 ENCOUNTER — Other Ambulatory Visit (HOSPITAL_COMMUNITY): Payer: Self-pay

## 2024-01-08 ENCOUNTER — Telehealth: Payer: Self-pay | Admitting: Podiatry

## 2024-01-08 NOTE — Telephone Encounter (Signed)
 pt lft mess. cld bck and she is going to email form and I wll call her once done.

## 2024-01-11 ENCOUNTER — Telehealth: Payer: Self-pay | Admitting: Podiatry

## 2024-01-11 NOTE — Telephone Encounter (Signed)
 cld pt bck from mess left on my vmail and she adv she emailed me, and nthing came thru. gave both emails-nothing.she will bring from in office. She wears Addidas, Nike/Jordan's/Converse

## 2024-01-12 ENCOUNTER — Telehealth: Payer: Self-pay | Admitting: Podiatry

## 2024-01-12 ENCOUNTER — Encounter: Payer: Self-pay | Admitting: Podiatry

## 2024-01-12 NOTE — Telephone Encounter (Signed)
 Pt emailed forms from [facility] [facility] on her accommodation. I emailed completed letter and forms back to her

## 2024-01-13 ENCOUNTER — Telehealth: Payer: Self-pay | Admitting: Podiatry

## 2024-01-13 DIAGNOSIS — Z0271 Encounter for disability determination: Secondary | ICD-10-CM

## 2024-01-13 NOTE — Telephone Encounter (Signed)
 cld pt and she will bring her auth part of form to office and I will email all to [email or web address] and email pt copy for her records.

## 2024-02-17 ENCOUNTER — Emergency Department (HOSPITAL_COMMUNITY)

## 2024-02-17 ENCOUNTER — Emergency Department (HOSPITAL_COMMUNITY)
Admission: EM | Admit: 2024-02-17 | Discharge: 2024-02-17 | Disposition: A | Discharge status: Home / Self Care | Attending: Emergency Medicine | Admitting: Emergency Medicine

## 2024-02-17 ENCOUNTER — Other Ambulatory Visit: Payer: Self-pay

## 2024-02-17 DIAGNOSIS — R252 Cramp and spasm: Secondary | ICD-10-CM | POA: Diagnosis present

## 2024-02-17 LAB — CBC
HCT: 41.3 % (ref 36.0–46.0)
Hemoglobin: 12.7 g/dL (ref 12.0–15.0)
MCH: 26.9 pg (ref 26.0–34.0)
MCHC: 30.8 g/dL (ref 30.0–36.0)
MCV: 87.5 fL (ref 80.0–100.0)
Platelets: 370 K/uL (ref 150–400)
RBC: 4.72 MIL/uL (ref 3.87–5.11)
RDW: 13.7 % (ref 11.5–15.5)
WBC: 10.2 K/uL (ref 4.0–10.5)
nRBC: 0 % (ref 0.0–0.2)

## 2024-02-17 LAB — BASIC METABOLIC PANEL WITH GFR
Anion gap: 13 (ref 5–15)
BUN: 18 mg/dL (ref 6–20)
CO2: 24 mmol/L (ref 22–32)
Calcium: 9.6 mg/dL (ref 8.9–10.3)
Chloride: 105 mmol/L (ref 98–111)
Creatinine, Ser: 0.79 mg/dL (ref 0.44–1.00)
GFR, Estimated: >60 mL/min (ref >60)
Glucose, Bld: 76 mg/dL (ref 70–99)
Potassium: 4.2 mmol/L (ref 3.5–5.1)
Sodium: 141 mmol/L (ref 135–145)

## 2024-02-17 LAB — HEPATIC FUNCTION PANEL
ALT: 18 U/L (ref 0–44)
AST: 23 U/L (ref 15–41)
Albumin: 4.5 g/dL (ref 3.5–5.0)
Alkaline Phosphatase: 66 U/L (ref 38–126)
Bilirubin, Direct: 0.1 mg/dL (ref 0.0–0.2)
Indirect Bilirubin: 0.3 mg/dL (ref 0.3–0.9)
Total Bilirubin: 0.4 mg/dL (ref 0.0–1.2)
Total Protein: 7.6 g/dL (ref 6.5–8.1)

## 2024-02-17 LAB — TROPONIN T, HIGH SENSITIVITY: Troponin T High Sensitivity: <15 ng/L (ref 0–19)

## 2024-02-17 MED ORDER — HYDROCODONE-ACETAMINOPHEN 5-325 MG PO TABS: 2.0000 | ORAL_TABLET | ORAL | 0 refills | Status: AC | PRN

## 2024-02-17 MED ORDER — SODIUM CHLORIDE 0.9 % IV BOLUS
1000.0000 mL | Freq: Once | INTRAVENOUS | Status: AC
  Administered 2024-02-17: 1000 mL via INTRAVENOUS

## 2024-02-17 MED ORDER — DIAZEPAM 5 MG PO TABS
5.0000 mg | ORAL_TABLET | Freq: Once | ORAL | Status: AC
  Administered 2024-02-17: 5 mg via ORAL
  Filled 2024-02-17: qty 1

## 2024-02-17 MED ORDER — MORPHINE SULFATE (PF) 4 MG/ML IV SOLN
4.0000 mg | Freq: Once | INTRAVENOUS | Status: AC
  Administered 2024-02-17: 4 mg via INTRAVENOUS
  Filled 2024-02-17: qty 1

## 2024-02-17 MED ORDER — ALUM & MAG HYDROXIDE-SIMETH 200-200-20 MG/5ML PO SUSP
30.0000 mL | Freq: Once | ORAL | Status: AC
  Administered 2024-02-17: 30 mL via ORAL
  Filled 2024-02-17: qty 30

## 2024-02-17 NOTE — ED Provider Notes (Signed)
 [place] [facility] AT [facility] Provider Note   CSN: [id number] Arrival date & time: 02/17/24  1751     Patient presents with: Foot Pain   Tasha Ross is a 48 y.o. female.   48 y.o female with no PMH SVT, Cardiomyopathy presents to the ED via EMS with cramping of bilateral feet.  Patient reports she was in bed when suddenly she felt her left foot dorsi flexing against her well.  She then noticed her right foot doing the same.  She reports that both of them were cramping really bad, she did reports the cramps lasted a few minutes.  She came in via EMS, did not take any medication for improvement of symptoms.  Now, she feels a slight tightness in her chest, she does have a prior history of SVT.  Currently works as a production designer, theatre/television/film, states that she does her rounds for 10 minutes and then sits down most of her shift.  She denies any trauma, no prior history of this, no shortness of breath or other complaints.  The history is provided by the patient.  Foot Pain This is a new problem. The current episode started 12 to 24 hours ago. The problem occurs constantly. Associated symptoms include chest pain. Pertinent negatives include no abdominal pain and no shortness of breath.       Prior to Admission medications   Medication Sig Start Date End Date Taking? Authorizing Provider  azelastine (ASTELIN) 0.1 % nasal spray Place 2 sprays into both nostrils daily. 05/31/16   [provider]  cetirizine (ZYRTEC) 10 MG tablet Take 10 mg by mouth daily.    [provider]  cyclobenzaprine  (FLEXERIL ) 10 MG tablet Take by mouth. 01/21/23   [provider]  fluticasone (FLONASE) 50 MCG/ACT nasal spray Place 2 sprays into both nostrils daily.    [provider]  isosorbide  mononitrate (IMDUR ) 30 MG 24 hr tablet TAKE 1 TABLET BY MOUTH EVERY DAY 02/26/23   Okey Vina GAILS, MD  metoprolol  succinate (TOPROL -XL) 25 MG 24 hr tablet Take 1 tablet (25 mg total)  by mouth daily. 10/31/22   Okey Vina GAILS, MD  metoprolol  tartrate (LOPRESSOR ) 100 MG tablet Take one tablet (100 mg) by mouth 2 hours prior to your cardiac CT. 12/08/22   Okey Vina GAILS, MD  nitroGLYCERIN  (NITROSTAT ) 0.4 MG SL tablet Place 1 tablet (0.4 mg total) under the tongue every 5 (five) minutes as needed for chest pain. 10/31/22   Okey Vina GAILS, MD  omeprazole  (PRILOSEC) 20 MG capsule Take 1 capsule (20 mg total) by mouth daily. Patient not taking: Reported on 02/19/2024 12/03/22   Okey Vina GAILS, MD  predniSONE  (STERAPRED UNI-PAK 21 TAB) 10 MG (21) TBPK tablet take 60mg  day 1, then 50mg  day 2, then 40mg  day 3, then 30mg  day 4, then 20mg  day 5, then 10mg  day 6, then STOP Patient not taking: Reported on 02/19/2024 04/14/23   Georjean Darice CHRISTELLA, MD  sodium fluoride (FLUORISHIELD) 1.1 % GEL dental gel PreviDent 5000 Booster Plus 1.1 % dental paste  USE TWICE A DAY Patient not taking: Reported on 02/19/2024    [provider]  topiramate  (TOPAMAX ) 200 MG tablet Take 1 tablet every morning 01/27/23   Georjean Darice CHRISTELLA, MD  Tretinoin  (ALTRENO ) 0.05 % LOTN Apply 1 Application topically at bedtime. 06/30/23   Alm Delon SAILOR, DO  Vitamin D, Ergocalciferol, (DRISDOL) 1.25 MG (50000 UNIT) CAPS capsule Take 50,000 Units by mouth. 07/07/23   [provider]    Allergies: Ibuprofen, Amitriptyline , Clindamycin , Elemental sulfur, Sulfa antibiotics, and Sulfamethoxazole    Review of Systems  Constitutional:  Negative for chills and fever.  Respiratory:  Negative for shortness of breath.   Cardiovascular:  Positive for chest pain.  Gastrointestinal:  Negative for abdominal pain, nausea and vomiting.  Musculoskeletal:  Positive for myalgias. Negative for back pain.    Updated Vital Signs BP 127/81 (BP Location: Left Arm)   Pulse 85   Temp 99.4 F (37.4 C) (Oral)   Resp 18   SpO2 100%   Physical Exam Vitals and nursing note reviewed.  Constitutional:      General: She is not in acute  distress.    Appearance: She is well-developed.  HENT:     Head: Normocephalic and atraumatic.     Mouth/Throat:     Pharynx: No oropharyngeal exudate.  Eyes:     Pupils: Pupils are equal, round, and reactive to light.  Cardiovascular:     Rate and Rhythm: Regular rhythm.     Pulses:          Dorsalis pedis pulses are 2+ on the right side and 2+ on the left side.       Posterior tibial pulses are 2+ on the right side and 2+ on the left side.     Heart sounds: Normal heart sounds.     Comments: No pitting edema, 2+ DP, PT pulses.  Good dorsiflexion and plantarflexion.  Good capillary refill. Pulmonary:     Effort: Pulmonary effort is normal. No respiratory distress.     Breath sounds: Normal breath sounds.  Abdominal:     General: Bowel sounds are normal. There is no distension.     Palpations: Abdomen is soft.     Tenderness: There is no abdominal tenderness.  Musculoskeletal:        General: No tenderness or deformity.     Cervical back: Normal range of motion.     Right lower leg: No edema.     Left lower leg: No edema.  Skin:    General: Skin is warm and dry.  Neurological:     Mental Status: She is alert and oriented to person, place, and time.     (all labs ordered are listed, but only abnormal results are displayed) Labs Reviewed  CBC  BASIC METABOLIC PANEL WITH GFR  HEPATIC FUNCTION PANEL  TROPONIN T, HIGH SENSITIVITY    EKG: EKG Interpretation Date/Time:  Wednesday February 17 2024 20:10:57 EDT Ventricular Rate:  90 PR Interval:  149 QRS Duration:  76 QT Interval:  348 QTC Calculation: 426 R Axis:   68  Text Interpretation: Sinus rhythm Borderline T wave abnormalities No significant change since last tracing Confirmed by Dreama Longs ([id number]) on 02/17/2024 8:58:12 PM  Radiology: No results found.    Procedures   Medications Ordered in the ED  alum & mag hydroxide-simeth (MAALOX/MYLANTA) 200-200-20 MG/5ML suspension 30 mL (30 mLs Oral Given  02/17/24 1913)  sodium chloride  0.9 % bolus 1,000 mL (0 mLs Intravenous Stopped 02/17/24 2228)  diazepam (VALIUM) tablet 5 mg (5 mg Oral Given 02/17/24 1912)  morphine  (PF) 4 MG/ML injection 4 mg (4 mg Intravenous Given 02/17/24 2046)                                    Medical Decision Making Amount and/or Complexity of Data Reviewed Labs: ordered. Radiology:  ordered.  Risk OTC drugs. Prescription drug management.   This patient presents to the ED for concern of bilateral feet cramping, this involves a number of treatment options, and is a complaint that carries with it a high risk of complications and morbidity.  The differential diagnosis includes electrolyte derangement, rhabdo myelosis versus infection.   Co morbidities: Discussed in HPI   Brief History:  See HPI  EMR reviewed including pt PMHx, past surgical history and past visits to ER.   See HPI for more details   Lab Tests:  I ordered and independently interpreted labs.  The pertinent results include:    I personally reviewed all laboratory work and imaging. Metabolic panel without any acute abnormality specifically kidney function within normal limits and no significant electrolyte abnormalities. CBC without leukocytosis or significant anemia.  Imaging Studies:  No imaging studies ordered for this patient  Cardiac Monitoring:  NA NA  Medicines ordered:  I ordered medication including bolus, morphine , Maalox, Valium for symptomatic treatment Reevaluation of the patient after these medicines showed that the patient improved I have reviewed the patients home medicines and have made adjustments as needed  Reevaluation:  After the interventions noted above I re-evaluated patient and found that they have :improved  Social Determinants of Health:  The patient's social determinants of health were a factor in the care of this patient  Problem List / ED Course:  Patient presented to the ED with chief  complaint of bilateral feet pain via EMS when she suddenly felt her feet dorsiflex against her self she noticed her right foot doing the same and alternating from the left to right.  She also tells me that she now feels a slight tenderness in her chest.  Reports that she currently works around as a production designer, theatre/television/film does walk for certain amount of time but also is sitting for a long time.  She denies any prior episodes like these.  On exam bilateral lower extremities are within normal limits, there is some calf tenderness feet are not dorsiflex or plantarflex she does have full range of motion of her feet.  They are both neurovascularly intact. Some concern for electrolyte abnormality such as hypocalcemia causing the spasms.  BMP obtained without any electrolyte derangement, creatinine was within normal limits.  CBC with no leukocytosis.  There is no open wounds to suggest any infection.  No redness or erythema to be concern for any type of cellulitis.  She did receive bolus, morphine , Valium to help with her muscle ache with mild improvement in symptoms.  LFTs were also evaluated without any acute findings at this time. Her chest pain has improved after receiving medication, we discussed likely not ACS related with a normal EKG, chest x-ray without any acute findings. Discussed these results with patient, I did suggest a PCP appointment in order to obtain a further evaluation panel with more labs along with vitamin checkups.  She is agreeable of this, we will send her home on a short course of pain control. Patient is hemodynamically stable for discharge at this time.  Dispostion:  After consideration of the diagnostic results and the patients response to treatment, I feel that the patent would benefit from close follow-up with PCP, return for worsening symptoms.    Portions of this note were generated with Scientist, clinical (histocompatibility and immunogenetics). Dictation errors may occur despite best attempts at proofreading.   Final  diagnoses:  Foot cramps    ED Discharge Orders  Ordered    HYDROcodone -acetaminophen  (NORCO/VICODIN) 5-325 MG tablet  Every 4 hours PRN        02/17/24 2254               Cleophus Mendonsa, PA-C 03/02/24 1744    Dreama Longs, MD 03/06/24 [phone]

## 2024-02-17 NOTE — ED Triage Notes (Signed)
 Pt BIB [facility] from home with reports of bilateral feet cramping that started at 1630 today.

## 2024-02-17 NOTE — Discharge Instructions (Addendum)
 Your laboratories all are within normal limits today.  You were sent home with short course of pain control, please take this medication as prescribed.  Please follow-up with your primary care physician as cost.

## 2024-02-19 ENCOUNTER — Ambulatory Visit: Admitting: Podiatry

## 2024-02-19 ENCOUNTER — Encounter: Payer: Self-pay | Admitting: Podiatry

## 2024-02-19 VITALS — Ht 62.0 in | Wt 156.0 lb

## 2024-02-19 DIAGNOSIS — M65971 Unspecified synovitis and tenosynovitis, right ankle and foot: Secondary | ICD-10-CM

## 2024-02-19 MED ORDER — BETAMETHASONE SOD PHOS & ACET 6 (3-3) MG/ML IJ SUSP
3.0000 mg | Freq: Once | INTRAMUSCULAR | Status: AC
  Administered 2024-02-19: 3 mg via INTRA_ARTICULAR

## 2024-02-19 NOTE — Progress Notes (Signed)
 02/19/2024  [facility]  Patient ID:  Tasha Ross is a 48 y.o. (DOB 1976-04-14) female.    AI technology was used in creating this visit note. Verbal consent from the patient/caregiver was obtained prior to its use.   Assessment and Plan  Tasha Ross was seen today for hand pain.  Diagnoses and all orders for this visit:  Cramps, muscle, general -     Magnesium; Future -     Magnesium  Immunization due -     Flucelvax Trivalent Preservative Free Prefilled Syringe  Vitamin D deficiency -     ergocalciferol (VITAMIN D2) 50,000 units CAPS capsule; Take one capsule (50,000 Units dose) by mouth once a week at 0900.  Overweight (BMI 25.0-29.9) -     phentermine (ADIPEX-P) 37.5 MG tablet; Take one tablet (37.5 mg dose) by mouth daily. Max Daily Amount: 37.5 mg -     Ambulatory Referral to [facility]; Future   Assessment & Plan 1. Trigger finger: - The symptoms suggest a possible diagnosis of trigger finger. - Splinting is recommended as an initial treatment approach. - If symptoms persist, steroid injections or surgery may be considered.  2. Muscle spasms: - The muscle spasms could be related to magnesium levels. Levels not checked during ER visit.  - A magnesium level test will be conducted to determine if supplementation is necessary.  3. Carpal tunnel syndrome: - The patient has a history of carpal tunnel syndrome in the left hand. - No new treatment was discussed during this visit.  4. Weight management: - The patient's weight has been fluctuating despite being on phentermine 37.5 mg for about a year. Patient would like to remain on it for now until set up with [facility] -Recommended following up with cardiology team and making them aware of use of phentermine -Blood pressure and HR normal at today's visit.  - A referral to [facility] will be made for further evaluation and potential prescription of alternative weight loss interventions and/or  medications - A 30-day supply of phentermine will be provided, and the patient is advised to take it during waking hours to avoid insomnia.  Follow up for Already scheduled-- 03/18/2024  8:00 AM.   Risks, benefits, and alternatives of the medications and treatment plan prescribed today were discussed, and patient expressed understanding. Plan follow-up as discussed or as needed if any worsening symptoms or change in condition.    A yearly preventative health exam was recommended and current age based recommendations were discussed.   Subjective   Patient ID:  Tasha Ross is a 49 y.o. (DOB 03-02-1976) female    Patient presents with  . Hand Pain     History of Present Illness The patient presents for evaluation of trigger finger, muscle spasms, carpal tunnel syndrome.   She reports experiencing a locking sensation in her left middle finger. This issue has been ongoing but recently escalated, causing difficulty in gripping objects.   On Wednesday, her right foot became immobile, necessitating an emergency room visit. By the time the ambulance arrived, she was unable to walk on either foot. Her foot remained stuck for over an hour before spontaneously returning to its normal position after a few hours. She describes the pain as excruciating, likening it to childbirth. Reports normal blood work and electrolyte levels, she is unsure if her magnesium levels were checked but recalls that her potassium levels were normal. She reports body aches and back spasms that began around the same time as her other  symptoms. Stretching exacerbates her back spasms. She received a cortisone injection in her foot today.   She maintains hydration by drinking approximately a gallon of water daily and avoids sodas. She reports no abnormal joint pain but experiences generalized body pain. She has tried ibuprofen and Tylenol  for pain relief. Her urine is light yellow in color.  She is currently on  phentermine 37.5 mg, which she has been taking for about a year, resulting in fluctuating weight loss. She takes phentermine every other day and reports no side effects such as elevated blood pressure or abnormal heart rates.   She has been diagnosed with carpal tunnel syndrome in her right hand, confirmed by testing, but has not received any treatment. She occasionally applies pressure to alleviate the pain. Splints have not been effective.  She sleeps for only 2 hours per night, a long-standing issue even when she was not working night shifts. She has tried Ambien for sleep but discontinued it due to adverse effects.  She is on topiramate  for migraines and metoprolol  for cardiomyopathy, prescribed by her cardiologist whom she sees annually.    Current Outpatient Medications  Medication Instructions  . azelastine (ASTELIN) 0.1 % nasal spray 1 spray, Nasal, 2 times a day  . cetirizine (ZYRTEC) 10 mg, Oral, Daily  . ergocalciferol (VITAMIN D2) 50,000 Units, Oral, Weekly at 0900  . fluticasone propionate (FLONASE) 50 mcg/actuation nasal spray 2 sprays, Both Nostrils, Daily  . metoprolol  succinate (TOPROL -XL) 25 mg, Daily  . metoprolol  tartrate (LOPRESSOR ) 100 mg tablet Take one tablet (100 mg) by mouth 2 hours prior to your cardiac CT.  . nitroGLYCERIN  (NITROSTAT ) 0.4 mg SL tablet PLACE 1 TABLET UNDER THE TONGUE EVERY 5 MINUTES AS NEEDED FOR CHEST PAIN.  SABRA phentermine (ADIPEX-P) 37.5 mg, Oral, Daily  . PREVIDENT 5000 DRY MOUTH 1.1 % GEL dental gel SMARTSIG:To Teeth Twice Daily  . topiramate  (TOPAMAX ) 100 MG tablet 1 tablet, Daily   Patient Care Team: Vina Okey GAILS, MD as Consulting Physician (Cardiology) Darice CHRISTELLA Shivers, MD (Neurology) Social History   Tobacco Use  . Smoking status: Never  . Smokeless tobacco: Never  Substance Use Topics  . Alcohol use: No    Reviewed and updated this visit by provider: Tobacco  Allergies  Meds  Problems  Med Hx  Surg Hx  Fam Hx  PDMP        Review of Systems  Musculoskeletal:  Positive for myalgias. Negative for arthralgias and joint swelling.       Objective   Vitals:   02/19/24 1301  BP: 118/86  Patient Position: Sitting  Pulse: 80  Temp: 98 F (36.7 C)  TempSrc: Temporal  Resp: 18  Height: 5' 2 (1.575 m)  Weight: 158 lb (71.7 kg)  SpO2: 98%  BMI (Calculated): 28.9   Wt Readings from Last 3 Encounters:  02/19/24 158 lb (71.7 kg)  12/10/23 152 lb (68.9 kg)  10/15/23 152 lb 12.8 oz (69.3 kg)    Physical Exam Constitutional:      Appearance: Normal appearance.  HENT:     Head: Normocephalic.  Cardiovascular:     Rate and Rhythm: Normal rate and regular rhythm.     Pulses: Normal pulses.     Heart sounds: Normal heart sounds.  Musculoskeletal:        General: Tenderness (bilateral ankles) present. Normal range of motion.  Pulmonary:     Effort: Pulmonary effort is normal.     Breath sounds: Normal breath sounds.  Abdominal:  General: Bowel sounds are normal.     Palpations: Abdomen is soft.  Skin:    General: Skin is warm and dry.  Neurological:     General: No focal deficit present.     Mental Status: She is alert and oriented to person, place, and time. Mental status is at baseline.  Psychiatric:        Mood and Affect: Mood normal.        Behavior: Behavior normal.     Rosaline JENEANE Maid, FNP
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
## 2024-05-11 ENCOUNTER — Ambulatory Visit: Admitting: Neurology

## 2024-05-11 ENCOUNTER — Encounter: Payer: Self-pay | Admitting: Neurology

## 2024-05-11 VITALS — BP 137/87 | HR 80 | Ht 62.0 in | Wt 163.2 lb

## 2024-05-11 DIAGNOSIS — G43109 Migraine with aura, not intractable, without status migrainosus: Secondary | ICD-10-CM | POA: Diagnosis not present

## 2024-05-11 MED ORDER — TOPIRAMATE 200 MG PO TABS: ORAL_TABLET | ORAL | 4 refills | Status: AC

## 2024-05-11 NOTE — Patient Instructions (Signed)
 Good to see you.  Continue Topiramate  200mg  daily  2. Try the Nurtec and let me know if helpful so we can send refills  3. Follow-up in 8 months, call for any changes

## 2024-05-11 NOTE — Progress Notes (Signed)
 "  NEUROLOGY FOLLOW UP OFFICE NOTE  Tasha Ross [id number] 04-26-1976  Discussed the use of AI scribe software for clinical note transcription with the patient, who gave verbal consent to proceed.  History of Present Illness I had the pleasure of seeing Tasha Ross in follow-up in the neurology clinic on 05/11/2024.  The patient was last seen 6 months ago for vertigo, headaches, and syncope. Brain MRI unremarkable. She had an abnormal echo and holter monitor with heart rates up to 180-190 bpm, amitriptyline  felt to be potential cause. 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 She is on Topiramate  200mg  daily for migraine prophylaxis with good response. Migraines have improved since the last visit six months ago, with only three episodes occurring since then. One episode was particularly severe, lasting four days. Stress is suspected as a potential trigger, especially following the recent passing of her father in November. No side effects on Topiramate .  She describes a history of muscle cramps and spasms, particularly in her right foot, which have been occurring more frequently. An episode in October left her foot stuck for two hours, and a similar incident in November prevented her from driving. She has also experienced her left hand cramping and getting stuck, though less frequently. Previous blood work was normal (normal CK), and an MRI was performed on her tibia due to tenderness. She has a history of carpal tunnel syndrome diagnosed in her left hand about four years ago. She needs to apply pressure to her hand to relieve pain, and her right hand is beginning to show similar symptoms.  She reports significant sleep disturbances, often only getting two to three hours of sleep at a time, which has been a long-standing issue. Despite feeling tired, she struggles to stay asleep and often wakes up after a short period. No episodes of  loss of consciousness since 2018. No dizziness, vision changes, no falls.    History on Initial Assessment 01/17/2015: This is a 49 yo RH woman presenting with vertigo, headaches, and episodes of passing out that started in 2009 or 2010. She had seen neurologist [name] in 2012 while living in [place], reporting that she is having the exact same symptoms she had, they had gotten under control while she was seeing him, with no episodes for a year, until January 2016 as she was transitioning for her move to [facility] last March 2016. She reports migraines followed a couple of days later by vertigo. She would brace herself, trying to sit still, but would lose consciousness. She has fallen to the floor at work. Sometimes she can brace herself, other times there is no warning. Symptoms are brief, she would wake up on the floor. She was admitted to [facility] last 10/22/14 for near syncope, she reported right-sided weakness with slurred speech, that improved during transport. Glucose noted to be 72. She had an MRI brain without contrast with no acute intracranial changes. MRA brain reported a 1.5mm bulbous appearance of the distal left PICA branches in the region of the inferior cerebellar vermis. It may represent a vascular loop, however a small aneurysm is not entirely excluded. 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 Records from her previous neurologist were reviewed. He was concerned that dizziness and vertigo were not physiological in nature. He had done an MRI/MRA brain reported as normal as well. TSH, RPR, CBC normal. B12 low normal at 278, she had received replacement treatment. For  migraine prophylaxis, she was started on amitriptyline  in 2012, then switched to Topamax  in 2013. She reports migraines are retro-orbital, sometimes dull or sharp pain, with phonophobia, nausea. She recalls taking amitriptyline  and Topamax , and states these were stopped because  the episodes stopped. She recalls taking Imitrex , Fioricet, Maxalt which would help initially. She feels the amitriptyline  and citalopram in the past helped put her in a calm mode. She has been having more frequent migraines occurring twice a week, lasting 3-4 hours. Ibuprofen does not help. The vertigo occurs a couple of days after a migraine, with spinning sensation lasting up to a week. She usually gets less than  6 hours of sleep, feeling real agitated. She reports the last syncopal episode was in June. When asked about stress, she does indicate that she has been working for [employer] since 2014, but had to move to a different location with her move in January.   She had a syncopal episode at work last 08/19/16 and has had a headache, neck pain, and seeing floaters since then. 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 No convulsive activity seen on video. She came to and recalls feeling sick to her stomach and unable to talk, with her words not coming out clear. She reports another syncopal episode in March on the way to work, she felt a pressure in her chest. She was recently treated with antibiotic for congestion, but has started feeling ill again. She wonders if weather changes are a trigger, she has become more hypersensitive to smells since Spring started.   Prior preventative medications: amitriptyline , topamax , Toprol , zonisamide , citalopram Prior rescue medications: imitrex , relpax, maxalt, zomig , fioricet, Ubrelvy    PAST MEDICAL HISTORY: Past Medical History:  Diagnosis Date   Blood transfusion without reported diagnosis    Cardiomyopathy (HCC)    a. Echo 01/2015: EF 45-50% with diffuse HK. // Echo 06/2018: EF 55, no RWMA, prominent apical trabeculation   History of stress test    a. ETT 11/16:  normal   Migraine    Near syncope    Snoring 04/06/2015   SVT (supraventricular tachycardia)     a. Seen on event monitor 01/2015.   Vertigo     MEDICATIONS: Medications Ordered Prior to Encounter[1]  ALLERGIES: Allergies[2]  FAMILY HISTORY: Family History  Problem Relation Age of Onset   Stomach cancer Father    Heart disease Maternal Aunt        Patient does not know details   Ovarian cancer Maternal Aunt    Hypertension Maternal Aunt    Lupus Paternal Aunt    Throat cancer Paternal Uncle    Leukemia Maternal Grandmother    Diabetes Maternal Grandmother    Glaucoma Paternal Grandmother    Dementia Paternal Grandmother    Hypertension Paternal Grandmother    Lupus Cousin    Hypertension Other    Diabetes Other    Stroke Neg Hx    Fainting Neg Hx    Migraines Neg Hx    Seizures Neg Hx     SOCIAL HISTORY: Social History   Socioeconomic History   Marital status: Single    Spouse name: Not on file   Number of children: 4   Years of education: Not on file   Highest education level: Not on file  Occupational History   Occupation: Furniture conservator/restorer  Tobacco Use   Smoking status: Never  Passive exposure: Never   Smokeless tobacco: Never  Vaping Use   Vaping status: Never Used  Substance and Sexual Activity   Alcohol use: Not Currently    Comment: Rare - once or twice a month.   Drug use: No   Sexual activity: Yes  Other Topics Concern   Not on file  Social History Narrative   Lives with 3 of her sons.  Has 4 sons.  Works for [employer].  Education: some college. Right handed    Social Drivers of Health   Tobacco Use: Low Risk  (02/19/2024)   Received from [facility]   Patient History    Smoking Tobacco Use: Never    Smokeless Tobacco Use: Never    Passive Exposure: Not on file  Financial Resource Strain: Low Risk  (05/13/2023)   Received from [facility]   Overall Financial Resource Strain (CARDIA)    Difficulty of Paying Living Expenses: Not hard at all  Food Insecurity: No Food Insecurity (05/13/2023)   Received from [facility]   Epic     Within the past 12 months, you worried that your food would run out before you got the money to buy more.: Never true    Within the past 12 months, the food you bought just didn't last and you didn't have money to get more.: Never true  Transportation Needs: No Transportation Needs (05/13/2023)   Received from [facility] - Transportation    Lack of Transportation (Medical): No    Lack of Transportation (Non-Medical): No  Physical Activity: Unknown (05/13/2023)   Received from [facility]   Exercise Vital Sign    On average, how many days per week do you engage in moderate to strenuous exercise (like a brisk walk)?: 3 days    Minutes of Exercise per Session: Not on file  Stress: No Stress Concern Present (05/13/2023)   Received from [facility] - Occupational Stress Questionnaire    Feeling of Stress : Not at all  Social Connections: Socially Integrated (05/13/2023)   Received from [facility]   Social Network    How would you rate your social network (family, work, friends)?: Good participation with social networks  Intimate Partner Violence: Not At Risk (05/13/2023)   Received from [facility]   HITS    Over the last 12 months how often did your partner physically hurt you?: Never    Over the last 12 months how often did your partner insult you or talk down to you?: Never    Over the last 12 months how often did your partner threaten you with physical harm?: Never    Over the last 12 months how often did your partner scream or curse at you?: Never  Depression (PHQ2-9): Not on file  Alcohol Screen: Not on file  Housing: Low Risk  (05/13/2023)   Received from [facility]    In the last 12 months, was there a time when you were not able to pay the mortgage or rent on time?: No    In the past 12 months, how many times have you moved where you were living?: 1    At any time in the past 12 months, were you homeless or living  in a shelter (including now)?: No  Utilities: Not At Risk (05/13/2023)   Received from [facility] Utilities    Threatened with loss of utilities: No  Health Literacy: Not on file     PHYSICAL EXAM: Vitals:   05/11/24 1106  BP: 137/87  Pulse: 80  SpO2: 100%   General: No acute distress Head:  Normocephalic/atraumatic Skin/Extremities: No rash, no edema Neurological Exam: alert and awake. No aphasia or dysarthria. Fund of knowledge is appropriate. Attention and concentration are normal.   Cranial nerves: Pupils equal, round. Extraocular movements intact with no nystagmus. Visual fields full.  No facial asymmetry.  Motor: Bulk and tone normal, muscle strength 5/5 throughout with no pronator drift.  Sensation intact to pin, cold, vibration sense. Reflexes +1 throughout. Finger to nose testing intact.  Gait narrow-based and steady, able to tandem walk adequately.  Romberg negative.   IMPRESSION: This is a pleasant 49 yo RH woman with a history of headaches, dizziness, and syncope since 2009 or 2010, symptoms had quieted down, then recurred at the beginning of 2016. On review of records, her previous neurologist was concerned dizziness and vertigo were not physiologic in nature. She did not tolerate amitriptyline  for headache prophylaxis, in addition, Cardiology felt this was causing SVT on holter monitor. She is on Toprol  for SVT with no further syncopal episodes since April 2018. Migraines overall stable on Topiramate  200mg  daily. She will try samples of Nurtec and will let us  know if helpful so refills can be sent. Follow-up in 8 months, call for any changes.    Thank you for allowing me to participate in her care.  Please do not hesitate to call for any questions or concerns.    Darice Shivers, M.D.   CC: [facility]        [1]  Current Outpatient Medications on File Prior to Visit  Medication Sig Dispense Refill   azelastine (ASTELIN)  0.1 % nasal spray Place 2 sprays into both nostrils daily.  11   cetirizine (ZYRTEC) 10 MG tablet Take 10 mg by mouth daily.     cyclobenzaprine  (FLEXERIL ) 10 MG tablet Take by mouth.     fluticasone (FLONASE) 50 MCG/ACT nasal spray Place 2 sprays into both nostrils daily.     isosorbide  mononitrate (IMDUR ) 30 MG 24 hr tablet TAKE 1 TABLET BY MOUTH EVERY DAY 90 tablet 1   metoprolol  succinate (TOPROL -XL) 25 MG 24 hr tablet Take 1 tablet (25 mg total) by mouth daily. 90 tablet 3   metoprolol  tartrate (LOPRESSOR ) 100 MG tablet Take one tablet (100 mg) by mouth 2 hours prior to your cardiac CT. 1 tablet 0   nitroGLYCERIN  (NITROSTAT ) 0.4 MG SL tablet Place 1 tablet (0.4 mg total) under the tongue every 5 (five) minutes as needed for chest pain. 25 tablet 3   omeprazole  (PRILOSEC) 20 MG capsule Take 1 capsule (20 mg total) by mouth daily. (Patient not taking: Reported on 02/19/2024) 30 capsule 11   predniSONE  (STERAPRED UNI-PAK 21 TAB) 10 MG (21) TBPK tablet take 60mg  day 1, then 50mg  day 2, then 40mg  day 3, then 30mg  day 4, then 20mg  day 5, then 10mg  day 6, then STOP (Patient not taking: Reported on 02/19/2024) 21 tablet 0   sodium fluoride (FLUORISHIELD) 1.1 % GEL dental gel PreviDent 5000 Booster Plus 1.1 % dental paste  USE TWICE A DAY (Patient not taking: Reported on 02/19/2024)     topiramate  (TOPAMAX ) 200 MG tablet Take 1 tablet every morning 90 tablet 3   Tretinoin  (ALTRENO ) 0.05 % LOTN Apply 1 Application topically at bedtime. 45 g 3   Vitamin D, Ergocalciferol, (DRISDOL) 1.25 MG (  50000 UNIT) CAPS capsule Take 50,000 Units by mouth.     No current facility-administered medications on file prior to visit.  [2]  Allergies Allergen Reactions   Ibuprofen Other (See Comments) and Rash    Per Neurologist request Per Neurologist request   Amitriptyline  Other (See Comments) and Rash    Causing headaches and head pains Causing headaches and head pains   Clindamycin  Rash   Elemental Sulfur  Swelling   Sulfa Antibiotics Swelling and Rash   Sulfamethoxazole Swelling   "

## 2024-05-13 ENCOUNTER — Ambulatory Visit: Admitting: Podiatry

## 2024-05-13 ENCOUNTER — Encounter: Payer: Self-pay | Admitting: Podiatry

## 2024-05-13 VITALS — Ht 62.0 in | Wt 163.2 lb

## 2024-05-13 DIAGNOSIS — M722 Plantar fascial fibromatosis: Secondary | ICD-10-CM | POA: Diagnosis not present

## 2024-05-13 DIAGNOSIS — M2042 Other hammer toe(s) (acquired), left foot: Secondary | ICD-10-CM

## 2024-05-13 DIAGNOSIS — M2041 Other hammer toe(s) (acquired), right foot: Secondary | ICD-10-CM

## 2024-05-13 DIAGNOSIS — Z01818 Encounter for other preprocedural examination: Secondary | ICD-10-CM

## 2024-05-13 MED ORDER — BETAMETHASONE SOD PHOS & ACET 6 (3-3) MG/ML IJ SUSP
3.0000 mg | Freq: Once | INTRAMUSCULAR | Status: AC
  Administered 2024-05-13: 3 mg via INTRA_ARTICULAR

## 2024-05-13 NOTE — Progress Notes (Signed)
 "  Chief Complaint  Patient presents with   Foot Pain    Pt is here to f/u on right foot, pain states the pain is back, injection from last visit helped a lot, still having cramping in the foot.    HPI: 49 y.o. female presenting today for follow-up evaluation of pain and tenderness associated to the right foot.  She says the injection helped significantly.  Now she is having pain along the medial longitudinal arch of the right foot  New complaint also today of hammertoes to the fifth digits bilateral with overlying symptomatic corns that develop.  She has tried different shoe gear but she continues to have pain and tenderness associated to the fifth digits bilateral  Past Medical History:  Diagnosis Date   Blood transfusion without reported diagnosis    Cardiomyopathy (HCC)    a. Echo 01/2015: EF 45-50% with diffuse HK. // Echo 06/2018: EF 55, no RWMA, prominent apical trabeculation   History of stress test    a. ETT 11/16:  normal   Migraine    Near syncope    Snoring 04/06/2015   SVT (supraventricular tachycardia)    a. Seen on event monitor 01/2015.   Vertigo     Past Surgical History:  Procedure Laterality Date   EPIGASTRIC HERNIA REPAIR     FOOT SURGERY Right 12/2021   TONSILLECTOMY     TUBAL LIGATION Bilateral    VAGINAL HYSTERECTOMY      Allergies  Allergen Reactions   Ibuprofen Other (See Comments) and Rash    Per Neurologist request Per Neurologist request   Amitriptyline  Other (See Comments) and Rash    Causing headaches and head pains Causing headaches and head pains   Clindamycin  Rash   Elemental Sulfur Swelling   Sulfa Antibiotics Swelling and Rash   Sulfamethoxazole Swelling     Physical Exam: General: The patient is alert and oriented x3 in no acute distress.  Dermatology: Skin is warm, dry and supple bilateral lower extremities.   Vascular: Palpable pedal pulses bilaterally. Capillary refill within normal limits.  No appreciable edema.  No  erythema.  Neurological: Grossly intact via light touch  Musculoskeletal Exam: Associated tenderness to palpation along the medial longitudinal arch of the right foot.  There does appear to be a plantar fibroma just proximal to the sesamoid apparatus with associated tenderness. Adductovarus hammertoe also noted to the fifth digits bilateral with associated tenderness to the PIPJ and hypertrophic head of the proximal phalanx which is very prominent bilateral  Radiographic Exam RT foot 06/01/2023:  Normal osseous mineralization. Joint spaces preserved.  2 orthopedic screws noted within the first metatarsal from prior bunion surgery.  Stable without any evidence of loosening.  Good alignment of the first ray  MR FOOT RIGHT WO CONTRAST (Accession [id number]) (Order [id number]) Imaging Date: 10/12/2023 Department: [facility] [address] Released By: Jakie Milling Authorizing: Janit Thresa HERO, DPM  FINDINGS: Postoperative change first metatarsal with metal artifact. No fracture. No erosions. Mild degenerative change first MTP joint with joint space narrowing and small osteophytes. Mild degenerative edema to the sesamoids likely exaggerated by metal artifact. The visualized marrow signal is otherwise unremarkable. No Morton's neuroma. The tendons and musculature are unremarkable. No subcutaneous edema. IMPRESSION: Unremarkable postoperative change first digit. Mild first MTP joint osteoarthritis.  Assessment/Plan of Care: 1. PSxHx Bunionectomy RT. DOS: 12/05/2021 2.  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  Adductovarus hammertoes fifth digits bilateral  -Patient evaluated.  -  Continue OTC power step insoles dispensed last visit -Continue prescribed Celebrex 100 mg from her PCP - Injection of 0.5 cc Celestone  Soluspan injected along the plantar fascia and fibroma of the right foot arch -I do believe the patient would benefit from custom  molded orthotics to support the medial longitudinal arch of the foot and potentially alleviate a lot of patient's symptoms and pain.  Today the patient was scanned and molded for custom orthotics -In regards to the symptomatic hammertoes of the fifth digit bilateral, she has tried multiple conservative treatment modalities including wide shoes but she continues to have pain and tenderness on a daily basis.  I do believe that surgery is warranted at this time which would include hammertoe arthroplasty of the fifth digits bilateral.  Risk benefits advantages disadvantages the procedure were explained in detail as well as the postoperative recovery course.  No guarantees were expressed or implied.  Patient consents will have surgery -Authorization for surgery was initiated today.  Surgery will consist PIPJ arthroplasty with derotational skin plasty fifth digits bilateral -Return to clinic 1 week postop  *Works part-time at [employer].  Also works full-time at [employer] center in [place]   Thresa EMERSON Sar, [place] [facility]  Dr. Thresa EMERSON Sar, DPM    [address] [place], [place] [address]                Office [phone]  Fax [phone]     "

## 2024-05-13 NOTE — Patient Instructions (Signed)

## 2024-05-27 ENCOUNTER — Telehealth: Payer: Self-pay | Admitting: Podiatry

## 2024-05-27 NOTE — Telephone Encounter (Signed)
 Orthotics in [place] office. Left a msg for Jordynn to call and schedule appoitment in [place] to pick up. Will send orthotics to [place]

## 2024-05-27 NOTE — Telephone Encounter (Signed)
 error

## 2024-06-02 ENCOUNTER — Other Ambulatory Visit

## 2024-06-08 NOTE — Telephone Encounter (Signed)
 Orthotics are back in the [place] office. Left a message for Eulogia to call and schedule a PUO appointment in [place]

## 2024-06-14 ENCOUNTER — Encounter
